# Patient Record
Sex: Female | Born: 1940 | Race: White | Hispanic: No | Marital: Married | Smoking: Former smoker
Health system: Southern US, Community
[De-identification: ages and names within clinical notes are randomized; demographics above are authoritative.]

## PROBLEM LIST (undated history)

## (undated) DIAGNOSIS — I471 Supraventricular tachycardia, unspecified: Secondary | ICD-10-CM

## (undated) DIAGNOSIS — D126 Benign neoplasm of colon, unspecified: Secondary | ICD-10-CM

## (undated) DIAGNOSIS — Z87891 Personal history of nicotine dependence: Secondary | ICD-10-CM

## (undated) DIAGNOSIS — R000 Tachycardia, unspecified: Secondary | ICD-10-CM

## (undated) DIAGNOSIS — E785 Hyperlipidemia, unspecified: Secondary | ICD-10-CM

## (undated) DIAGNOSIS — C50919 Malignant neoplasm of unspecified site of unspecified female breast: Secondary | ICD-10-CM

## (undated) DIAGNOSIS — T7840XA Allergy, unspecified, initial encounter: Secondary | ICD-10-CM

## (undated) DIAGNOSIS — C801 Malignant (primary) neoplasm, unspecified: Secondary | ICD-10-CM

## (undated) DIAGNOSIS — Z972 Presence of dental prosthetic device (complete) (partial): Secondary | ICD-10-CM

## (undated) DIAGNOSIS — K219 Gastro-esophageal reflux disease without esophagitis: Secondary | ICD-10-CM

## (undated) DIAGNOSIS — Z923 Personal history of irradiation: Secondary | ICD-10-CM

## (undated) DIAGNOSIS — Z9221 Personal history of antineoplastic chemotherapy: Secondary | ICD-10-CM

## (undated) HISTORY — DX: Hyperlipidemia, unspecified: E78.5

## (undated) HISTORY — DX: Allergy, unspecified, initial encounter: T78.40XA

## (undated) HISTORY — PX: TONSILLECTOMY: SUR1361

## (undated) HISTORY — DX: Personal history of nicotine dependence: Z87.891

## (undated) HISTORY — PX: TONSILLECTOMY: SHX5217

## (undated) HISTORY — PX: CATARACT EXTRACTION W/ INTRAOCULAR LENS IMPLANT: SHX1309

## (undated) HISTORY — PX: COLONOSCOPY: SHX174

## (undated) HISTORY — DX: Malignant (primary) neoplasm, unspecified: C80.1

---

## 1998-06-15 ENCOUNTER — Encounter: Payer: Self-pay | Admitting: Gastroenterology

## 1998-06-15 ENCOUNTER — Ambulatory Visit (HOSPITAL_COMMUNITY): Admission: RE | Admit: 1998-06-15 | Discharge: 1998-06-15 | Payer: Self-pay | Admitting: Gastroenterology

## 2003-08-07 HISTORY — PX: BREAST CYST ASPIRATION: SHX578

## 2004-12-26 ENCOUNTER — Ambulatory Visit: Payer: Self-pay | Admitting: General Surgery

## 2006-01-08 ENCOUNTER — Ambulatory Visit: Payer: Self-pay | Admitting: General Surgery

## 2007-01-14 ENCOUNTER — Ambulatory Visit: Payer: Self-pay | Admitting: General Surgery

## 2007-08-07 DIAGNOSIS — C801 Malignant (primary) neoplasm, unspecified: Secondary | ICD-10-CM

## 2007-08-07 DIAGNOSIS — C50919 Malignant neoplasm of unspecified site of unspecified female breast: Secondary | ICD-10-CM

## 2007-08-07 HISTORY — PX: BREAST LUMPECTOMY: SHX2

## 2007-08-07 HISTORY — PX: PORTACATH PLACEMENT: SHX2246

## 2007-08-07 HISTORY — DX: Malignant (primary) neoplasm, unspecified: C80.1

## 2007-08-07 HISTORY — PX: BREAST BIOPSY: SHX20

## 2007-08-07 HISTORY — PX: BREAST SURGERY: SHX581

## 2007-08-07 HISTORY — DX: Malignant neoplasm of unspecified site of unspecified female breast: C50.919

## 2008-01-15 ENCOUNTER — Ambulatory Visit: Payer: Self-pay | Admitting: General Surgery

## 2008-01-20 ENCOUNTER — Ambulatory Visit: Payer: Self-pay | Admitting: General Surgery

## 2008-02-04 ENCOUNTER — Ambulatory Visit: Payer: Self-pay | Admitting: Oncology

## 2008-02-12 ENCOUNTER — Ambulatory Visit: Payer: Self-pay | Admitting: General Surgery

## 2008-02-12 ENCOUNTER — Other Ambulatory Visit: Payer: Self-pay

## 2008-02-19 ENCOUNTER — Ambulatory Visit: Payer: Self-pay | Admitting: General Surgery

## 2008-03-02 ENCOUNTER — Ambulatory Visit: Payer: Self-pay | Admitting: Oncology

## 2008-03-06 ENCOUNTER — Ambulatory Visit: Payer: Self-pay | Admitting: Oncology

## 2008-03-08 ENCOUNTER — Ambulatory Visit: Payer: Self-pay | Admitting: General Surgery

## 2008-03-15 ENCOUNTER — Ambulatory Visit: Payer: Self-pay | Admitting: Oncology

## 2008-04-06 ENCOUNTER — Ambulatory Visit: Payer: Self-pay | Admitting: Oncology

## 2008-05-06 ENCOUNTER — Ambulatory Visit: Payer: Self-pay | Admitting: Oncology

## 2008-06-06 ENCOUNTER — Ambulatory Visit: Payer: Self-pay | Admitting: Oncology

## 2008-07-06 ENCOUNTER — Ambulatory Visit: Payer: Self-pay | Admitting: Oncology

## 2008-08-06 ENCOUNTER — Ambulatory Visit: Payer: Self-pay | Admitting: Oncology

## 2008-09-06 ENCOUNTER — Ambulatory Visit: Payer: Self-pay | Admitting: Oncology

## 2008-10-04 ENCOUNTER — Ambulatory Visit: Payer: Self-pay | Admitting: Oncology

## 2008-11-04 ENCOUNTER — Ambulatory Visit: Payer: Self-pay | Admitting: Oncology

## 2008-11-08 ENCOUNTER — Ambulatory Visit: Payer: Self-pay | Admitting: General Surgery

## 2008-11-22 ENCOUNTER — Ambulatory Visit: Payer: Self-pay | Admitting: Oncology

## 2008-12-04 ENCOUNTER — Ambulatory Visit: Payer: Self-pay | Admitting: Oncology

## 2009-01-04 ENCOUNTER — Ambulatory Visit: Payer: Self-pay | Admitting: Oncology

## 2009-02-03 ENCOUNTER — Ambulatory Visit: Payer: Self-pay | Admitting: Oncology

## 2009-03-06 ENCOUNTER — Ambulatory Visit: Payer: Self-pay | Admitting: Oncology

## 2009-04-06 ENCOUNTER — Ambulatory Visit: Payer: Self-pay | Admitting: Oncology

## 2009-05-06 ENCOUNTER — Ambulatory Visit: Payer: Self-pay | Admitting: Oncology

## 2009-05-10 ENCOUNTER — Ambulatory Visit: Payer: Self-pay | Admitting: Oncology

## 2009-05-11 ENCOUNTER — Ambulatory Visit: Payer: Self-pay | Admitting: General Surgery

## 2009-06-06 ENCOUNTER — Ambulatory Visit: Payer: Self-pay | Admitting: Oncology

## 2009-07-06 ENCOUNTER — Ambulatory Visit: Payer: Self-pay | Admitting: Oncology

## 2009-08-06 ENCOUNTER — Ambulatory Visit: Payer: Self-pay | Admitting: Oncology

## 2009-08-06 HISTORY — PX: PORT-A-CATH REMOVAL: SHX5289

## 2009-09-06 ENCOUNTER — Ambulatory Visit: Payer: Self-pay | Admitting: Oncology

## 2009-09-13 ENCOUNTER — Ambulatory Visit: Payer: Self-pay | Admitting: Oncology

## 2009-10-04 ENCOUNTER — Ambulatory Visit: Payer: Self-pay | Admitting: Oncology

## 2009-11-04 ENCOUNTER — Ambulatory Visit: Payer: Self-pay | Admitting: Oncology

## 2009-12-06 ENCOUNTER — Ambulatory Visit: Payer: Self-pay | Admitting: Oncology

## 2010-01-04 ENCOUNTER — Ambulatory Visit: Payer: Self-pay | Admitting: Oncology

## 2010-02-03 ENCOUNTER — Ambulatory Visit: Payer: Self-pay | Admitting: Oncology

## 2010-05-15 ENCOUNTER — Ambulatory Visit: Payer: Self-pay | Admitting: Oncology

## 2010-05-30 ENCOUNTER — Ambulatory Visit: Payer: Self-pay | Admitting: General Surgery

## 2010-07-11 ENCOUNTER — Ambulatory Visit: Payer: Self-pay | Admitting: Oncology

## 2010-07-12 LAB — CANCER ANTIGEN 27.29: CA 27.29: 29.4 U/mL (ref 0.0–38.6)

## 2010-08-06 ENCOUNTER — Ambulatory Visit: Payer: Self-pay | Admitting: Oncology

## 2010-10-16 ENCOUNTER — Ambulatory Visit: Payer: Self-pay | Admitting: Gastroenterology

## 2010-10-16 LAB — HM COLONOSCOPY

## 2011-01-16 ENCOUNTER — Ambulatory Visit: Payer: Self-pay | Admitting: Oncology

## 2011-02-04 ENCOUNTER — Ambulatory Visit: Payer: Self-pay | Admitting: Oncology

## 2011-05-16 ENCOUNTER — Ambulatory Visit: Payer: Self-pay | Admitting: General Surgery

## 2011-07-24 ENCOUNTER — Ambulatory Visit: Payer: Self-pay | Admitting: Oncology

## 2011-08-07 ENCOUNTER — Ambulatory Visit: Payer: Self-pay | Admitting: Oncology

## 2012-02-06 ENCOUNTER — Ambulatory Visit: Payer: Self-pay | Admitting: Oncology

## 2012-02-06 LAB — CBC CANCER CENTER
Basophil %: 0.4 %
Eosinophil #: 0 x10 3/mm (ref 0.0–0.7)
Eosinophil %: 0.3 %
HGB: 13.7 g/dL (ref 12.0–16.0)
Lymphocyte %: 25.2 %
MCH: 31.6 pg (ref 26.0–34.0)
MCHC: 33.9 g/dL (ref 32.0–36.0)
MCV: 93 fL (ref 80–100)
Monocyte #: 0.4 x10 3/mm (ref 0.2–0.9)
Neutrophil %: 67.8 %
Platelet: 225 x10 3/mm (ref 150–440)
WBC: 6 x10 3/mm (ref 3.6–11.0)

## 2012-02-06 LAB — COMPREHENSIVE METABOLIC PANEL
Albumin: 3.6 g/dL (ref 3.4–5.0)
Alkaline Phosphatase: 75 U/L (ref 50–136)
Calcium, Total: 9.2 mg/dL (ref 8.5–10.1)
Glucose: 123 mg/dL — ABNORMAL HIGH (ref 65–99)
Osmolality: 282 (ref 275–301)
SGPT (ALT): 30 U/L
Sodium: 140 mmol/L (ref 136–145)
Total Protein: 7.2 g/dL (ref 6.4–8.2)

## 2012-02-13 ENCOUNTER — Ambulatory Visit: Payer: Self-pay | Admitting: Family Medicine

## 2012-03-06 ENCOUNTER — Ambulatory Visit: Payer: Self-pay | Admitting: Oncology

## 2012-05-20 ENCOUNTER — Ambulatory Visit: Payer: Self-pay | Admitting: Oncology

## 2012-08-29 ENCOUNTER — Ambulatory Visit: Payer: Self-pay | Admitting: Oncology

## 2012-09-06 ENCOUNTER — Ambulatory Visit: Payer: Self-pay | Admitting: Oncology

## 2013-01-06 ENCOUNTER — Encounter: Payer: Self-pay | Admitting: *Deleted

## 2013-03-20 ENCOUNTER — Ambulatory Visit: Payer: Self-pay | Admitting: Oncology

## 2013-03-20 LAB — CBC CANCER CENTER
Basophil #: 0 x10 3/mm (ref 0.0–0.1)
Basophil %: 0.6 %
HCT: 38.2 % (ref 35.0–47.0)
HGB: 13.2 g/dL (ref 12.0–16.0)
Lymphocyte %: 32.7 %
MCH: 31.6 pg (ref 26.0–34.0)
Monocyte %: 6.1 %
Neutrophil #: 3.2 x10 3/mm (ref 1.4–6.5)
Neutrophil %: 60.3 %
Platelet: 217 x10 3/mm (ref 150–440)
RBC: 4.17 10*6/uL (ref 3.80–5.20)
RDW: 12.5 % (ref 11.5–14.5)
WBC: 5.3 x10 3/mm (ref 3.6–11.0)

## 2013-03-20 LAB — COMPREHENSIVE METABOLIC PANEL
Albumin: 3.5 g/dL (ref 3.4–5.0)
Anion Gap: 9 (ref 7–16)
BUN: 16 mg/dL (ref 7–18)
Bilirubin,Total: 0.5 mg/dL (ref 0.2–1.0)
Calcium, Total: 8.7 mg/dL (ref 8.5–10.1)
Chloride: 105 mmol/L (ref 98–107)
Co2: 27 mmol/L (ref 21–32)
Creatinine: 0.98 mg/dL (ref 0.60–1.30)
EGFR (Non-African Amer.): 57 — ABNORMAL LOW
Osmolality: 282 (ref 275–301)
SGOT(AST): 19 U/L (ref 15–37)
Sodium: 141 mmol/L (ref 136–145)
Total Protein: 7.1 g/dL (ref 6.4–8.2)

## 2013-03-21 LAB — CANCER ANTIGEN 27.29: CA 27.29: 21.3 U/mL (ref 0.0–38.6)

## 2013-04-06 ENCOUNTER — Ambulatory Visit: Payer: Self-pay | Admitting: Oncology

## 2013-05-21 ENCOUNTER — Ambulatory Visit: Payer: Self-pay | Admitting: General Surgery

## 2013-05-21 ENCOUNTER — Encounter: Payer: Self-pay | Admitting: General Surgery

## 2013-05-28 ENCOUNTER — Encounter: Payer: Self-pay | Admitting: General Surgery

## 2013-05-28 ENCOUNTER — Ambulatory Visit (INDEPENDENT_AMBULATORY_CARE_PROVIDER_SITE_OTHER): Payer: Medicare Other | Admitting: General Surgery

## 2013-05-28 VITALS — BP 148/70 | HR 80 | Resp 16 | Ht 65.5 in | Wt 173.0 lb

## 2013-05-28 DIAGNOSIS — Z853 Personal history of malignant neoplasm of breast: Secondary | ICD-10-CM

## 2013-05-28 NOTE — Progress Notes (Signed)
Patient ID: Toni Callahan, female   DOB: 04-13-1941, 72 y.o.   MRN: [id number]  Chief Complaint  Patient presents with  . Follow-up    1 year follow up mammogram    HPI Toni Callahan is a 72 y.o. female.  who presents for her annual follow up breast evaluation and mammogram. The most recent mammogram was done on 05-21-13.  Patient does perform regular self breast checks and gets regular mammograms done. No new breast issues. Tolerating the Femara and Dr. Doylene Canning wants her to take it for 3 more years.   HPI  Past Medical History  Diagnosis Date  . Allergy   . Personal history of tobacco use, presenting hazards to health   . Personal history of malignant neoplasm of breast 2009    left breast lumpectomy,chemotherapy and radiation therapy  . Special screening for malignant neoplasms, colon 2012  . Cancer 2009    left breast, T1, N0, M0. ER/PR positive, HER2 neu positive    Past Surgical History  Procedure Laterality Date  . Portacath placement  2009  . Breast surgery Left 2009    lumpectomy  . Aspiration biopsy  2005    breast  . Dilation and curettage of uterus  2005  . Tonsillectomy    . Colonoscopy  [id number]    Dr. Bluford Kaufmann  . Port-a-cath removal  2011    History reviewed. No pertinent family history.  Social History History  Substance Use Topics  . Smoking status: Former Smoker -- 20 years  . Smokeless tobacco: Not on file     Comment: quit in 1986 approximately  . Alcohol Use: Yes     Comment: drinks rarely    Allergies  Allergen Reactions  . Sulfa Antibiotics Other (See Comments)    Unknown reaction    Current Outpatient Prescriptions  Medication Sig Dispense Refill  . aspirin 81 MG tablet Take 81 mg by mouth daily.      . Biotin 5000 MCG CAPS Take 1 capsule by mouth daily.      . Calcium Carbonate (CALCIUM 600 PO) Take 1 tablet by mouth daily.      . Cholecalciferol (VITAMIN D PO) Take 1,000 Int'l Units by mouth daily.      . Cyanocobalamin (VITAMIN B 12  PO) Take 500 mg by mouth daily.      . Dapsone (ACZONE EX) Apply 1 application topically as needed.      . Diclofenac Sodium (SOLARAZE) 3 % GEL Place 1 application onto the skin as needed.      . folic acid (FOLVITE) 400 MCG tablet Take 400 mcg by mouth daily.      Marland Kitchen letrozole (FEMARA) 2.5 MG tablet Take 1 tablet by mouth daily.      Marland Kitchen lovastatin (MEVACOR) 40 MG tablet Take 0.5 tablets by mouth daily.      . Magnesium 250 MG TABS Take 1 tablet by mouth daily.      . metoprolol succinate (TOPROL-XL) 50 MG 24 hr tablet Take 0.5 tablets by mouth daily.      . metronidazole (NORITATE) 1 % cream Apply 1 application topically as needed.      . minocycline (MINOCIN,DYNACIN) 100 MG capsule Take 100 mg by mouth as needed.      . Multiple Vitamin (MULTIVITAMIN) tablet Take 1 tablet by mouth daily.      . Omega-3 Fatty Acids (FISH OIL) 1000 MG CAPS Take 1 capsule by mouth daily.      Marland Kitchen omeprazole (PRILOSEC) 20  MG capsule Take 1 capsule by mouth daily.      . Potassium 99 MG TABS Take 1 tablet by mouth daily.      . vitamin C (ASCORBIC ACID) 500 MG tablet Take 500 mg by mouth daily.       No current facility-administered medications for this visit.    Review of Systems Review of Systems  Blood pressure 148/70, pulse 80, resp. rate 16, height 5' 5.5" (1.664 m), weight 173 lb (78.472 kg).  Physical Exam Physical Exam  Constitutional: She is oriented to person, place, and time. She appears well-developed and well-nourished.  Eyes: Conjunctivae are normal. No scleral icterus.  Neck: Neck supple. No mass and no thyromegaly present.  Cardiovascular: Normal rate, regular rhythm and normal heart sounds.   Pulses:      Dorsalis pedis pulses are 2+ on the right side, and 2+ on the left side.       Posterior tibial pulses are 2+ on the right side, and 2+ on the left side.  No lower leg edema.  Pulmonary/Chest: Effort normal and breath sounds normal. Right breast exhibits no inverted nipple, no mass, no  nipple discharge, no skin change and no tenderness. Left breast exhibits no inverted nipple, no mass, no nipple discharge, no skin change and no tenderness.  Lumpectomy site healed incision left breast.  Abdominal: Soft. There is no hepatosplenomegaly.  Lymphadenopathy:    She has no cervical adenopathy.    She has no axillary adenopathy.  Neurological: She is alert and oriented to person, place, and time.  Skin: Skin is warm and dry.    Data Reviewed Mammogram reviewed and stable.  Assessment    Stable exam. Patient is 5 years post left breast cancer treatment. Currently on Femara which she is tolerating well.     Plan    The patient has been asked to return to the office in one year with a bilateral   diagnostic mammogram.       SANKAR,SEEPLAPUTHUR G 05/28/2013, 11:36 AM

## 2013-05-28 NOTE — Patient Instructions (Signed)
Continue self breast exams. Call office for any new breast issues or concerns. 

## 2013-10-16 ENCOUNTER — Ambulatory Visit: Payer: Self-pay | Admitting: Oncology

## 2013-10-16 LAB — COMPREHENSIVE METABOLIC PANEL
ANION GAP: 6 — AB (ref 7–16)
AST: 18 U/L (ref 15–37)
Albumin: 3.4 g/dL (ref 3.4–5.0)
Alkaline Phosphatase: 55 U/L
BUN: 16 mg/dL (ref 7–18)
Bilirubin,Total: 0.3 mg/dL (ref 0.2–1.0)
CALCIUM: 8.7 mg/dL (ref 8.5–10.1)
CHLORIDE: 103 mmol/L (ref 98–107)
CO2: 29 mmol/L (ref 21–32)
Creatinine: 0.87 mg/dL (ref 0.60–1.30)
EGFR (African American): >60
EGFR (Non-African Amer.): >60
Glucose: 109 mg/dL — ABNORMAL HIGH (ref 65–99)
Osmolality: 277 (ref 275–301)
Potassium: 4 mmol/L (ref 3.5–5.1)
SGPT (ALT): 16 U/L (ref 12–78)
SODIUM: 138 mmol/L (ref 136–145)
Total Protein: 6.9 g/dL (ref 6.4–8.2)

## 2013-10-16 LAB — CBC CANCER CENTER
Basophil #: 0 x10 3/mm (ref 0.0–0.1)
Basophil %: 0.6 %
EOS PCT: 0.7 %
Eosinophil #: 0 x10 3/mm (ref 0.0–0.7)
HCT: 39.5 % (ref 35.0–47.0)
HGB: 13.4 g/dL (ref 12.0–16.0)
LYMPHS ABS: 1.7 x10 3/mm (ref 1.0–3.6)
Lymphocyte %: 27.2 %
MCH: 30.9 pg (ref 26.0–34.0)
MCHC: 34 g/dL (ref 32.0–36.0)
MCV: 91 fL (ref 80–100)
Monocyte #: 0.3 x10 3/mm (ref 0.2–0.9)
Monocyte %: 5.2 %
Neutrophil #: 4 x10 3/mm (ref 1.4–6.5)
Neutrophil %: 66.3 %
Platelet: 259 x10 3/mm (ref 150–440)
RBC: 4.34 10*6/uL (ref 3.80–5.20)
RDW: 13.1 % (ref 11.5–14.5)
WBC: 6.1 x10 3/mm (ref 3.6–11.0)

## 2013-11-04 ENCOUNTER — Ambulatory Visit: Payer: Self-pay | Admitting: Oncology

## 2014-04-30 ENCOUNTER — Ambulatory Visit: Payer: Self-pay | Admitting: Oncology

## 2014-04-30 LAB — CBC CANCER CENTER
BASOS ABS: 0.1 x10 3/mm (ref 0.0–0.1)
BASOS PCT: 0.8 %
EOS ABS: 0 x10 3/mm (ref 0.0–0.7)
EOS PCT: 0.2 %
HCT: 40.7 % (ref 35.0–47.0)
HGB: 13.8 g/dL (ref 12.0–16.0)
Lymphocyte #: 1.7 x10 3/mm (ref 1.0–3.6)
Lymphocyte %: 22.8 %
MCH: 31.1 pg (ref 26.0–34.0)
MCHC: 34 g/dL (ref 32.0–36.0)
MCV: 92 fL (ref 80–100)
MONOS PCT: 6.4 %
Monocyte #: 0.5 x10 3/mm (ref 0.2–0.9)
NEUTROS ABS: 5.2 x10 3/mm (ref 1.4–6.5)
NEUTROS PCT: 69.8 %
PLATELETS: 245 x10 3/mm (ref 150–440)
RBC: 4.45 10*6/uL (ref 3.80–5.20)
RDW: 12.2 % (ref 11.5–14.5)
WBC: 7.5 x10 3/mm (ref 3.6–11.0)

## 2014-04-30 LAB — COMPREHENSIVE METABOLIC PANEL
ALT: 29 U/L
ANION GAP: 8 (ref 7–16)
Albumin: 3.6 g/dL (ref 3.4–5.0)
Alkaline Phosphatase: 61 U/L
BILIRUBIN TOTAL: 0.5 mg/dL (ref 0.2–1.0)
BUN: 15 mg/dL (ref 7–18)
CREATININE: 0.9 mg/dL (ref 0.60–1.30)
Calcium, Total: 8.9 mg/dL (ref 8.5–10.1)
Chloride: 106 mmol/L (ref 98–107)
Co2: 26 mmol/L (ref 21–32)
EGFR (African American): >60
Glucose: 90 mg/dL (ref 65–99)
OSMOLALITY: 280 (ref 275–301)
Potassium: 4.2 mmol/L (ref 3.5–5.1)
SGOT(AST): 20 U/L (ref 15–37)
SODIUM: 140 mmol/L (ref 136–145)
Total Protein: 7.1 g/dL (ref 6.4–8.2)

## 2014-05-03 LAB — CANCER ANTIGEN 27.29: CA 27.29: 26 U/mL (ref 0.0–38.6)

## 2014-05-06 ENCOUNTER — Ambulatory Visit: Payer: Self-pay | Admitting: Oncology

## 2014-05-17 DIAGNOSIS — R0681 Apnea, not elsewhere classified: Secondary | ICD-10-CM | POA: Insufficient documentation

## 2014-05-24 ENCOUNTER — Encounter: Payer: Self-pay | Admitting: General Surgery

## 2014-05-24 ENCOUNTER — Ambulatory Visit: Payer: Self-pay | Admitting: General Surgery

## 2014-06-03 ENCOUNTER — Ambulatory Visit (INDEPENDENT_AMBULATORY_CARE_PROVIDER_SITE_OTHER): Payer: Medicare Other | Admitting: General Surgery

## 2014-06-03 ENCOUNTER — Encounter: Payer: Self-pay | Admitting: General Surgery

## 2014-06-03 VITALS — BP 130/68 | Ht 65.0 in | Wt 166.0 lb

## 2014-06-03 DIAGNOSIS — C50212 Malignant neoplasm of upper-inner quadrant of left female breast: Secondary | ICD-10-CM | POA: Insufficient documentation

## 2014-06-03 DIAGNOSIS — Z853 Personal history of malignant neoplasm of breast: Secondary | ICD-10-CM | POA: Insufficient documentation

## 2014-06-03 NOTE — Patient Instructions (Addendum)
Continue self breast exams. Call office for any new breast issues or concerns.    Follow up in one year bilateral diagnostic mammogram and office visit.

## 2014-06-03 NOTE — Progress Notes (Signed)
Patient ID: Toni Callahan, female   DOB: April 20, 1941, 73 y.o.   MRN: [id number]  Chief Complaint  Patient presents with  . Follow-up    mammogram    HPI Toni Callahan is a 73 y.o. female.  who presents for her follow up breast cancer and breast evaluation. The most recent mammogram was done on 05-24-14.  Patient does perform regular self breast checks and gets regular mammograms done.  Tolerating Femara and Dr Oliva Bustard wants her to take it 3 more years. No new breast issues.  HPI  Past Medical History  Diagnosis Date  . Allergy   . Personal history of tobacco use, presenting hazards to health   . Personal history of malignant neoplasm of breast 2009    left breast lumpectomy,chemotherapy and radiation therapy  . Special screening for malignant neoplasms, colon 2012  . Cancer 2009    left breast, T1, N0, M0. ER/PR positive, HER2 neu positive    Past Surgical History  Procedure Laterality Date  . Portacath placement  2009  . Breast surgery Left 2009    lumpectomy  . Aspiration biopsy  2005    breast  . Dilation and curettage of uterus  2005  . Tonsillectomy    . Colonoscopy  [id number]    Dr. Candace Cruise  . Port-a-cath removal  2011    History reviewed. No pertinent family history.  Social History History  Substance Use Topics  . Smoking status: Former Smoker -- 20 years  . Smokeless tobacco: Never Used     Comment: quit in 1986 approximately  . Alcohol Use: Yes     Comment: drinks rarely    Allergies  Allergen Reactions  . Sulfa Antibiotics Other (See Comments)    Unknown reaction    Current Outpatient Prescriptions  Medication Sig Dispense Refill  . aspirin 81 MG tablet Take 81 mg by mouth daily.      . Biotin 5000 MCG CAPS Take 1 capsule by mouth daily.      . Calcium Carbonate (CALCIUM 600 PO) Take 1 tablet by mouth daily.      . Cholecalciferol (VITAMIN D PO) Take 1,000 Int'l Units by mouth daily.      . Cyanocobalamin (VITAMIN B 12 PO) Take 500 mg by mouth  daily.      . folic acid (FOLVITE) 478 MCG tablet Take 400 mcg by mouth daily.      Marland Kitchen letrozole (FEMARA) 2.5 MG tablet Take 1 tablet by mouth daily.      Marland Kitchen lovastatin (MEVACOR) 40 MG tablet Take 0.5 tablets by mouth daily.      . Magnesium 250 MG TABS Take 1 tablet by mouth daily.      . metoprolol succinate (TOPROL-XL) 50 MG 24 hr tablet Take 0.5 tablets by mouth daily.      . Multiple Vitamin (MULTIVITAMIN) tablet Take 1 tablet by mouth daily.      . Omega-3 Fatty Acids (FISH OIL) 1000 MG CAPS Take 1 capsule by mouth every other day.       . Potassium 99 MG TABS Take 1 tablet by mouth daily.      . vitamin C (ASCORBIC ACID) 500 MG tablet Take 500 mg by mouth every other day.        No current facility-administered medications for this visit.    Review of Systems Review of Systems  Constitutional: Negative.   Respiratory: Negative.   Cardiovascular: Negative.     Blood pressure 130/68, height _0  (  1.651 m), weight 166 lb (75.297 kg).  Physical Exam Physical Exam  Constitutional: She is oriented to person, place, and time. She appears well-developed and well-nourished.  Eyes: Conjunctivae are normal. No scleral icterus.  Neck: Neck supple.  Cardiovascular: Normal rate, regular rhythm and normal heart sounds.   Pulses:      Dorsalis pedis pulses are 2+ on the right side, and 2+ on the left side.       Posterior tibial pulses are 2+ on the right side, and 2+ on the left side.  No lower leg edema.  Pulmonary/Chest: Effort normal and breath sounds normal. Right breast exhibits no inverted nipple, no mass, no nipple discharge, no skin change and no tenderness. Left breast exhibits no inverted nipple, no mass, no nipple discharge, no skin change and no tenderness.  Well healed lumpectomy site left breast.  Abdominal: Soft. Normal appearance. There is no hepatosplenomegaly. There is no tenderness.  Lymphadenopathy:    She has no cervical adenopathy.    She has no axillary adenopathy.   Neurological: She is alert and oriented to person, place, and time.  Skin: Skin is warm and dry.    Data Reviewed Mammogram reviewed and stable.  Assessment    Stable physical exam.    Plan    Follow up in one year bilateral diagnostic mammogram and office visit.     PCP: Lattie Haw 06/03/2014, 5:30 PM

## 2014-06-07 ENCOUNTER — Encounter: Payer: Self-pay | Admitting: General Surgery

## 2014-09-10 DIAGNOSIS — Z961 Presence of intraocular lens: Secondary | ICD-10-CM | POA: Insufficient documentation

## 2014-09-16 DIAGNOSIS — I10 Essential (primary) hypertension: Secondary | ICD-10-CM | POA: Insufficient documentation

## 2014-09-16 DIAGNOSIS — I071 Rheumatic tricuspid insufficiency: Secondary | ICD-10-CM | POA: Insufficient documentation

## 2015-01-27 ENCOUNTER — Encounter: Payer: Self-pay | Admitting: Family Medicine

## 2015-01-27 ENCOUNTER — Ambulatory Visit (INDEPENDENT_AMBULATORY_CARE_PROVIDER_SITE_OTHER): Payer: Medicare Other | Admitting: Family Medicine

## 2015-01-27 VITALS — BP 151/80 | HR 71 | Temp 97.8°F | Ht 64.6 in | Wt 166.6 lb

## 2015-01-27 DIAGNOSIS — L259 Unspecified contact dermatitis, unspecified cause: Secondary | ICD-10-CM

## 2015-01-27 MED ORDER — PREDNISONE 10 MG PO TABS: ORAL_TABLET | ORAL | Status: DC

## 2015-01-27 NOTE — Patient Instructions (Addendum)
Take any OTC allergy medicine- zyrtec, allegra, claritin Keep putting the benadryl or the calomine lotion on it.   Contact Dermatitis Contact dermatitis is a reaction to certain substances that touch the skin. Contact dermatitis can be either irritant contact dermatitis or allergic contact dermatitis. Irritant contact dermatitis does not require previous exposure to the substance for a reaction to occur.Allergic contact dermatitis only occurs if you have been exposed to the substance before. Upon a repeat exposure, your body reacts to the substance.  CAUSES  Many substances can cause contact dermatitis. Irritant dermatitis is most commonly caused by repeated exposure to mildly irritating substances, such as:  Makeup.  Soaps.  Detergents.  Bleaches.  Acids.  Metal salts, such as nickel. Allergic contact dermatitis is most commonly caused by exposure to:  Poisonous plants.  Chemicals (deodorants, shampoos).  Jewelry.  Latex.  Neomycin in triple antibiotic cream.  Preservatives in products, including clothing. SYMPTOMS  The area of skin that is exposed may develop:  Dryness or flaking.  Redness.  Cracks.  Itching.  Pain or a burning sensation.  Blisters. With allergic contact dermatitis, there may also be swelling in areas such as the eyelids, mouth, or genitals.  DIAGNOSIS  Your caregiver can usually tell what the problem is by doing a physical exam. In cases where the cause is uncertain and an allergic contact dermatitis is suspected, a patch skin test may be performed to help determine the cause of your dermatitis. TREATMENT Treatment includes protecting the skin from further contact with the irritating substance by avoiding that substance if possible. Barrier creams, powders, and gloves may be helpful. Your caregiver may also recommend:  Steroid creams or ointments applied 2 times daily. For best results, soak the rash area in cool water for 20 minutes. Then  apply the medicine. Cover the area with a plastic wrap. You can store the steroid cream in the refrigerator for a "chilly" effect on your rash. That may decrease itching. Oral steroid medicines may be needed in more severe cases.  Antibiotics or antibacterial ointments if a skin infection is present.  Antihistamine lotion or an antihistamine taken by mouth to ease itching.  Lubricants to keep moisture in your skin.  Burow's solution to reduce redness and soreness or to dry a weeping rash. Mix one packet or tablet of solution in 2 cups cool water. Dip a clean washcloth in the mixture, wring it out a bit, and put it on the affected area. Leave the cloth in place for 30 minutes. Do this as often as possible throughout the day.  Taking several cornstarch or baking soda baths daily if the area is too large to cover with a washcloth. Harsh chemicals, such as alkalis or acids, can cause skin damage that is like a burn. You should flush your skin for 15 to 20 minutes with cold water after such an exposure. You should also seek immediate medical care after exposure. Bandages (dressings), antibiotics, and pain medicine may be needed for severely irritated skin.  HOME CARE INSTRUCTIONS  Avoid the substance that caused your reaction.  Keep the area of skin that is affected away from hot water, soap, sunlight, chemicals, acidic substances, or anything else that would irritate your skin.  Do not scratch the rash. Scratching may cause the rash to become infected.  You may take cool baths to help stop the itching.  Only take over-the-counter or prescription medicines as directed by your caregiver.  See your caregiver for follow-up care as directed to  make sure your skin is healing properly. SEEK MEDICAL CARE IF:   Your condition is not better after 3 days of treatment.  You seem to be getting worse.  You see signs of infection such as swelling, tenderness, redness, soreness, or warmth in the affected  area.  You have any problems related to your medicines. Document Released: 07/20/2000 Document Revised: 10/15/2011 Document Reviewed: 12/26/2010 [facility] Patient Information 2015 [place], [place]. This information is not intended to replace advice given to you by your health care provider. Make sure you discuss any questions you have with your health care provider.

## 2015-01-27 NOTE — Progress Notes (Signed)
  BP 151/80 mmHg  Pulse 71  Temp(Src) 97.8 F (36.6 C)  Ht 5' 4.6" (1.641 m)  Wt 166 lb 9.6 oz (75.569 kg)  BMI 28.06 kg/m2  SpO2 96%   Subjective:    Patient ID: Toni Callahan, female    DOB: [birth year], 74 y.o.   MRN: [id number]  HPI: Toni Callahan is a 74 y.o. female  Chief Complaint  Patient presents with  . Rash   RASH was cutting a redbush in her yard yesterday in a sleeveless shirt and a couple of hours later ended up with a very itchy irritated rash on her arm.  Duration:1-2  days  Location: shoulder, arm, neck and back  Itching: yes Burning: no Redness: yes Oozing: no Scaling: no Blisters: yes Painful: no Fevers: no Change in detergents/soaps/personal care products: no Recent illness: no Recent travel:no History of same: no Context: stable Alleviating factors: benadryl Treatments attempted:benadryl Shortness of breath: no  Throat/tongue swelling: no Myalgias/arthralgias: no Relevant past medical, surgical, family and social history reviewed and updated as indicated. Interim medical history since our last visit reviewed. Allergies and medications reviewed and updated.  Review of Systems  Constitutional: Negative.   Respiratory: Positive for apnea.   Cardiovascular: Negative.   Skin: Positive for rash. Negative for color change, pallor and wound.  Psychiatric/Behavioral: Negative.     Per HPI unless specifically indicated above     Objective:    BP 151/80 mmHg  Pulse 71  Temp(Src) 97.8 F (36.6 C)  Ht 5' 4.6" (1.641 m)  Wt 166 lb 9.6 oz (75.569 kg)  BMI 28.06 kg/m2  SpO2 96%  Wt Readings from Last 3 Encounters:  01/27/15 166 lb 9.6 oz (75.569 kg)  06/03/14 166 lb (75.297 kg)  05/28/13 173 lb (78.472 kg)    Physical Exam  Constitutional: She is oriented to person, place, and time. She appears well-developed and well-nourished. No distress.  HENT:  Head: Normocephalic and atraumatic.  Right Ear: Hearing normal.  Left Ear: Hearing  normal.  Nose: Nose normal.  Eyes: Conjunctivae and lids are normal. Right eye exhibits no discharge. Left eye exhibits no discharge. No scleral icterus.  Pulmonary/Chest: Effort normal. No respiratory distress.  Musculoskeletal: Normal range of motion.  Neurological: She is alert and oriented to person, place, and time.  Skin: Skin is intact. Rash noted.  erythmatous small wheels on R shoulder and neck with some excoriation  Psychiatric: She has a normal mood and affect. Her speech is normal and behavior is normal. Judgment and thought content normal. Cognition and memory are normal.  Nursing note and vitals reviewed.       Assessment & Plan:   Problem List Items Addressed This Visit    None    Visit Diagnoses    Contact dermatitis    -  Primary    From her tree yesterday, severe. Will do longer taper and OTC allergy medicines. Call if not getting better or getting worse.         Follow up plan: Return if symptoms worsen or fail to improve.

## 2015-03-21 ENCOUNTER — Other Ambulatory Visit: Payer: Self-pay

## 2015-03-21 DIAGNOSIS — C50212 Malignant neoplasm of upper-inner quadrant of left female breast: Secondary | ICD-10-CM

## 2015-04-22 ENCOUNTER — Other Ambulatory Visit: Payer: Self-pay | Admitting: *Deleted

## 2015-04-22 DIAGNOSIS — C50212 Malignant neoplasm of upper-inner quadrant of left female breast: Secondary | ICD-10-CM

## 2015-04-28 ENCOUNTER — Inpatient Hospital Stay (HOSPITAL_BASED_OUTPATIENT_CLINIC_OR_DEPARTMENT_OTHER): Payer: Medicare Other | Admitting: Oncology

## 2015-04-28 ENCOUNTER — Inpatient Hospital Stay: Payer: Medicare Other | Attending: Oncology

## 2015-04-28 ENCOUNTER — Ambulatory Visit (INDEPENDENT_AMBULATORY_CARE_PROVIDER_SITE_OTHER): Payer: Medicare Other | Admitting: Family Medicine

## 2015-04-28 ENCOUNTER — Encounter: Payer: Self-pay | Admitting: Oncology

## 2015-04-28 ENCOUNTER — Encounter: Payer: Self-pay | Admitting: Family Medicine

## 2015-04-28 VITALS — BP 123/74 | HR 81 | Temp 98.8°F | Ht 64.6 in | Wt 169.0 lb

## 2015-04-28 VITALS — BP 129/82 | HR 74 | Temp 95.9°F | Wt 169.2 lb

## 2015-04-28 DIAGNOSIS — C50212 Malignant neoplasm of upper-inner quadrant of left female breast: Secondary | ICD-10-CM

## 2015-04-28 DIAGNOSIS — Z7982 Long term (current) use of aspirin: Secondary | ICD-10-CM | POA: Insufficient documentation

## 2015-04-28 DIAGNOSIS — Z170 Estrogen receptor positive status [ER+]: Secondary | ICD-10-CM | POA: Diagnosis not present

## 2015-04-28 DIAGNOSIS — Z79899 Other long term (current) drug therapy: Secondary | ICD-10-CM | POA: Insufficient documentation

## 2015-04-28 DIAGNOSIS — Z87891 Personal history of nicotine dependence: Secondary | ICD-10-CM

## 2015-04-28 DIAGNOSIS — Z9223 Personal history of estrogen therapy: Secondary | ICD-10-CM | POA: Diagnosis not present

## 2015-04-28 DIAGNOSIS — R109 Unspecified abdominal pain: Secondary | ICD-10-CM | POA: Insufficient documentation

## 2015-04-28 DIAGNOSIS — R1084 Generalized abdominal pain: Secondary | ICD-10-CM

## 2015-04-28 DIAGNOSIS — E785 Hyperlipidemia, unspecified: Secondary | ICD-10-CM | POA: Insufficient documentation

## 2015-04-28 DIAGNOSIS — Z853 Personal history of malignant neoplasm of breast: Secondary | ICD-10-CM

## 2015-04-28 DIAGNOSIS — I471 Supraventricular tachycardia: Secondary | ICD-10-CM | POA: Insufficient documentation

## 2015-04-28 LAB — COMPREHENSIVE METABOLIC PANEL
ALK PHOS: 48 U/L (ref 38–126)
ALT: 17 U/L (ref 14–54)
ANION GAP: 8 (ref 5–15)
AST: 24 U/L (ref 15–41)
Albumin: 3.8 g/dL (ref 3.5–5.0)
BILIRUBIN TOTAL: 0.6 mg/dL (ref 0.3–1.2)
BUN: 18 mg/dL (ref 6–20)
CALCIUM: 8.4 mg/dL — AB (ref 8.9–10.3)
CO2: 24 mmol/L (ref 22–32)
CREATININE: 0.78 mg/dL (ref 0.44–1.00)
Chloride: 105 mmol/L (ref 101–111)
Glucose, Bld: 113 mg/dL — ABNORMAL HIGH (ref 65–99)
Potassium: 4 mmol/L (ref 3.5–5.1)
SODIUM: 137 mmol/L (ref 135–145)
TOTAL PROTEIN: 7 g/dL (ref 6.5–8.1)

## 2015-04-28 LAB — MICROSCOPIC EXAMINATION: RENAL EPITHEL UA: NONE SEEN /HPF

## 2015-04-28 LAB — CBC WITH DIFFERENTIAL/PLATELET
Basophils Absolute: 0 10*3/uL (ref 0–0.1)
Basophils Relative: 1 %
EOS ABS: 0 10*3/uL (ref 0–0.7)
Eosinophils Relative: 0 %
HEMATOCRIT: 40.2 % (ref 35.0–47.0)
HEMOGLOBIN: 13.6 g/dL (ref 12.0–16.0)
LYMPHS ABS: 1.6 10*3/uL (ref 1.0–3.6)
LYMPHS PCT: 25 %
MCH: 31 pg (ref 26.0–34.0)
MCHC: 33.9 g/dL (ref 32.0–36.0)
MCV: 91.2 fL (ref 80.0–100.0)
MONOS PCT: 6 %
Monocytes Absolute: 0.4 10*3/uL (ref 0.2–0.9)
NEUTROS PCT: 68 %
Neutro Abs: 4.4 10*3/uL (ref 1.4–6.5)
Platelets: 227 10*3/uL (ref 150–440)
RBC: 4.4 MIL/uL (ref 3.80–5.20)
RDW: 12.6 % (ref 11.5–14.5)
WBC: 6.5 10*3/uL (ref 3.6–11.0)

## 2015-04-28 LAB — URINALYSIS, ROUTINE W REFLEX MICROSCOPIC
BILIRUBIN UA: NEGATIVE
GLUCOSE, UA: NEGATIVE
Ketones, UA: NEGATIVE
Nitrite, UA: NEGATIVE
PH UA: 5.5 (ref 5.0–7.5)
PROTEIN UA: NEGATIVE
SPEC GRAV UA: 1.015 (ref 1.005–1.030)
UUROB: 0.2 mg/dL (ref 0.2–1.0)

## 2015-04-28 NOTE — Progress Notes (Signed)
BP 123/74 mmHg  Pulse 81  Temp(Src) 98.8 F (37.1 C)  Ht 5' 4.6" (1.641 m)  Wt 169 lb (76.658 kg)  BMI 28.47 kg/m2  SpO2 98%   Subjective:    Patient ID: Toni Callahan, female    DOB: March 07, 1941, 74 y.o.   MRN: [id number]  HPI: Toni Callahan is a 74 y.o. female  Chief Complaint  Patient presents with  . changes in bowel movements   patient for the last month to 6 weeks has noticed bowel movements have changed have gotten slightly darker, change in character with a little looser and more frequent and less volume. Sometimes bowel movements are slightly painful, but different in a nonspecific way. Reviewed  Last colonoscopy from 2014 which was entirely normal. No nausea vomiting, no loss of appetite or weight loss. Some occasional abdominal pain associated generalized Patient with a lot of stress in her life is been ongoing for the last year or so her husband becoming ill with congestive heart failure.  Got a good report from [facility] with breast cancer with no signs or symptoms now 7 years post diagnosis and treatment. Had blood work done this morning at the [facility] see below Blood pressure cholesterol medicines doing well no change no symptoms.  Relevant past medical, surgical, family and social history reviewed and updated as indicated. Interim medical history since our last visit reviewed. Allergies and medications reviewed and updated.  Review of Systems  Constitutional: Negative.   Respiratory: Negative.   Cardiovascular: Negative.     Per HPI unless specifically indicated above     Objective:    BP 123/74 mmHg  Pulse 81  Temp(Src) 98.8 F (37.1 C)  Ht 5' 4.6" (1.641 m)  Wt 169 lb (76.658 kg)  BMI 28.47 kg/m2  SpO2 98%  Wt Readings from Last 3 Encounters:  04/28/15 169 lb (76.658 kg)  04/28/15 169 lb 4 oz (76.771 kg)  01/27/15 166 lb 9.6 oz (75.569 kg)    Physical Exam  Constitutional: She is oriented to person, place, and time. She  appears well-developed and well-nourished. No distress.  HENT:  Head: Normocephalic and atraumatic.  Right Ear: Hearing and external ear normal.  Left Ear: Hearing and external ear normal.  Nose: Nose normal.  Eyes: Conjunctivae and lids are normal. Right eye exhibits no discharge. Left eye exhibits no discharge. No scleral icterus.  Cardiovascular: Normal rate, regular rhythm and normal heart sounds.   Pulmonary/Chest: Effort normal. No respiratory distress. She exhibits no tenderness.  Musculoskeletal: Normal range of motion.  Lymphadenopathy:    She has no cervical adenopathy.  Neurological: She is alert and oriented to person, place, and time.  Skin: Skin is intact. No rash noted.  Psychiatric: She has a normal mood and affect. Her speech is normal and behavior is normal. Judgment and thought content normal. Cognition and memory are normal.    Results for orders placed or performed in visit on 04/28/15  CBC with Differential  Result Value Ref Range   WBC 6.5 3.6 - 11.0 K/uL   RBC 4.40 3.80 - 5.20 MIL/uL   Hemoglobin 13.6 12.0 - 16.0 g/dL   HCT 40.2 35.0 - 47.0 %   MCV 91.2 80.0 - 100.0 fL   MCH 31.0 26.0 - 34.0 pg   MCHC 33.9 32.0 - 36.0 g/dL   RDW 12.6 11.5 - 14.5 %   Platelets 227 150 - 440 K/uL   Neutrophils Relative % 68 %   Neutro  Abs 4.4 1.4 - 6.5 K/uL   Lymphocytes Relative 25 %   Lymphs Abs 1.6 1.0 - 3.6 K/uL   Monocytes Relative 6 %   Monocytes Absolute 0.4 0.2 - 0.9 K/uL   Eosinophils Relative 0 %   Eosinophils Absolute 0.0 0 - 0.7 K/uL   Basophils Relative 1 %   Basophils Absolute 0.0 0 - 0.1 K/uL  Comprehensive metabolic panel  Result Value Ref Range   Sodium 137 135 - 145 mmol/L   Potassium 4.0 3.5 - 5.1 mmol/L   Chloride 105 101 - 111 mmol/L   CO2 24 22 - 32 mmol/L   Glucose, Bld 113 (H) 65 - 99 mg/dL   BUN 18 6 - 20 mg/dL   Creatinine, Ser 0.78 0.44 - 1.00 mg/dL   Calcium 8.4 (L) 8.9 - 10.3 mg/dL   Total Protein 7.0 6.5 - 8.1 g/dL   Albumin 3.8 3.5  - 5.0 g/dL   AST 24 15 - 41 U/L   ALT 17 14 - 54 U/L   Alkaline Phosphatase 48 38 - 126 U/L   Total Bilirubin 0.6 0.3 - 1.2 mg/dL   GFR calc non Af Amer >60 >60 mL/min   GFR calc Af Amer >60 >60 mL/min   Anion gap 8 5 - 15      Assessment & Plan:   Problem List Items Addressed This Visit      Other   Abdominal pain - Primary    Reviewed lab work from hematology this morning with normal CBC and CMP. Urinalysis done here was normal Colonoscopy reviewed was normal For now will observe patient's symptoms patient increase fiber  report any changes.      Relevant Orders   Urinalysis, Routine w reflex microscopic (not at [facility]   CBC With Differential/Platelet   Basic metabolic panel       Follow up plan: Return if symptoms worsen or fail to improve, for Has follow-up appointment in January.

## 2015-04-28 NOTE — Progress Notes (Signed)
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  Reports feeling well, no c/o.  Specifically denies fever, chills, HA, dizziness, chest pain, dyspnea, n/v, diarrhea/constipation, or pain.  Appetite good.   Patient is off letrozole.  Having no bony pain or bony fracture.  Getting regular mammograms done  REVIEW OF SYSTEMS:   GENERAL:  Feels good.  Active.  No fevers, sweats or weight loss. PERFORMANCE STATUS (ECOG):  01 HEENT:  No visual changes, runny nose, sore throat, mouth sores or tenderness. Lungs: No shortness of breath or cough.  No hemoptysis. Cardiac:  No chest pain, palpitations, orthopnea, or PND. GI:  No nausea, vomiting, diarrhea, constipation, melena or hematochezia. GU:  No urgency, frequency, dysuria, or hematuria. Musculoskeletal:  No back pain.  No joint pain.  No muscle tenderness. Extremities:  No pain or swelling. Skin:  No rashes or skin changes. Neuro:  No headache, numbness or weakness, balance or coordination issues. Endocrine:  No diabetes, thyroid issues, hot flashes or night sweats. Psych:  No mood changes, depression or anxiety. Pain:  No focal pain. Review of systems:  All other systems reviewed and found to be negative.  As per HPI. Otherwise, a complete review of systems is negatve.  PAST  MEDICAL HISTORY: Past Medical History  Diagnosis Date  . Allergy   . Personal history of tobacco use, presenting hazards to health   . Personal history of malignant neoplasm of breast 2009    left breast lumpectomy,chemotherapy and radiation therapy  . Special screening for malignant neoplasms, colon 2012  . Cancer 2009    left breast, T1, N0, M0. ER/PR positive, HER2 neu positive    PAST SURGICAL HISTORY: Past Surgical History  Procedure Laterality Date  . Portacath placement  2009  . Breast surgery Left 2009    lumpectomy  . Aspiration biopsy  2005    breast  . Tonsillectomy    . Colonoscopy  [id number]    Dr. Candace Cruise  . Port-a-cath removal  2011    FAMILY HISTORY Family History  Problem Relation Age of Onset  . Stroke Mother   . Heart disease Father   . Diabetes Brother    status post lumpectomy and sentinel lymph node biopsy in July  of 2009   PFSH:  Family History noncontributory   Comments No family history of colorectal cancer, breast cancer, or ovarian cancer.   r   Additional Past Medical and Surgical History does not smoke at smoke several years ago. Previous use of estrogen therapy for several years. Off and now   ALLERGIES:   Sulfa drugs: GI Distress  ADVANCED DIRECTIVES:  Patient does not have any living will or healthcare power of attorney.  Information was given .  Available resources had been discussed.  We will follow-up on subsequent appointments regarding this issue HEALTH MAINTENANCE: Social History  Substance  Use Topics  . Smoking status: Former Smoker -- 20 years    Quit date: 08/06/1984  . Smokeless tobacco: Never Used     Comment: quit in 1986 approximately  . Alcohol Use: Yes     Comment: drinks rarely      Allergies  Allergen Reactions  . Sulfa Antibiotics Nausea Only    Current Outpatient Prescriptions  Medication Sig Dispense Refill  . aspirin 81 MG tablet Take 81 mg by mouth daily.    . Biotin 5000 MCG CAPS Take 1 capsule by  mouth daily.    . Calcium Carbonate (CALCIUM 600 PO) Take 1 tablet by mouth daily.    . Cholecalciferol (VITAMIN D PO) Take 1,000 Int'l Units by mouth daily.    . Cyanocobalamin (VITAMIN B 12 PO) Take 500 mg by mouth daily.    . folic acid (FOLVITE) 509 MCG tablet Take 400 mcg by mouth daily.    Marland Kitchen lovastatin (MEVACOR) 40 MG tablet Take 0.5 tablets by mouth daily.    . Magnesium 250 MG TABS Take 1 tablet by mouth daily.    . metoprolol succinate (TOPROL-XL) 50 MG 24 hr tablet Take 0.5 tablets by mouth daily.    . Multiple Vitamin (MULTIVITAMIN) tablet Take 1 tablet by mouth daily.    . Omega-3 Fatty Acids (FISH OIL) 1000 MG CAPS Take 1 capsule by mouth every other day.     . Potassium 99 MG TABS Take 1 tablet by mouth daily.    . predniSONE (DELTASONE) 10 MG tablet Take 6 pills today and tomorrow, 5 pills days 3 and 4, decrease by 1 pill every 2 days until gone. 42 tablet 0  . vitamin C (ASCORBIC ACID) 500 MG tablet Take 500 mg by mouth every other day.     Marland Kitchen omeprazole (PRILOSEC) 20 MG capsule Take by mouth.     No current facility-administered medications for this visit.    OBJECTIVE:  Filed Vitals:   04/28/15 1032  BP: 129/82  Pulse: 74  Temp: 95.9 F (35.5 C)     Body mass index is 28.51 kg/(m^2).    ECOG FS:1 - Symptomatic but completely ambulatory  PHYSICAL EXAM: General  status: Performance status is good.  Patient has not lost significant weight HEENT: No evidence of stomatitis. Sclera and conjunctivae :: No jaundice.   pale looking. Lungs: Air  entry equal on both sides.  No rhonchi.  No rales.  Cardiac: Heart sounds are normal.  No pericardial rub.  No murmur. Lymphatic system: Cervical, axillary, inguinal, lymph nodes not palpable GI: Abdomen is soft.  No ascites.  Liver spleen not palpable.  No tenderness.  Bowel sounds are within normal limit Lower extremity: No edema Neurological system: Higher functions, cranial nerves intact no evidence of peripheral  neuropathy. Skin: No rash.  No ecchymosis.. Examination of both breasts: No palpable masses.  Lymph nodes not palpable. No evidence of recurrent disease   LAB RESULTS:  CBC Latest Ref Rng 04/28/2015 04/30/2014  WBC 3.6 - 11.0 K/uL 6.5 7.5  Hemoglobin 12.0 - 16.0 g/dL 13.6 13.8  Hematocrit 35.0 - 47.0 % 40.2 40.7  Platelets 150 - 440 K/uL 227 245    Appointment on 04/28/2015  Component Date Value Ref Range Status  . WBC 04/28/2015 6.5  3.6 - 11.0 K/uL Final  . RBC 04/28/2015 4.40  3.80 - 5.20 MIL/uL Final  . Hemoglobin 04/28/2015 13.6  12.0 - 16.0 g/dL Final  . HCT 04/28/2015 40.2  35.0 - 47.0 %  Final  . MCV 04/28/2015 91.2  80.0 - 100.0 fL Final  . MCH 04/28/2015 31.0  26.0 - 34.0 pg Final  . MCHC 04/28/2015 33.9  32.0 - 36.0 g/dL Final  . RDW 04/28/2015 12.6  11.5 - 14.5 % Final  . Platelets 04/28/2015 227  150 - 440 K/uL Final  . Neutrophils Relative % 04/28/2015 68   Final  . Neutro Abs 04/28/2015 4.4  1.4 - 6.5 K/uL Final  . Lymphocytes Relative 04/28/2015 25   Final  . Lymphs Abs 04/28/2015 1.6  1.0 - 3.6 K/uL Final  . Monocytes Relative 04/28/2015 6   Final  . Monocytes Absolute 04/28/2015 0.4  0.2 - 0.9 K/uL Final  . Eosinophils Relative 04/28/2015 0   Final  . Eosinophils Absolute 04/28/2015 0.0  0 - 0.7 K/uL Final  . Basophils Relative 04/28/2015 1   Final  . Basophils Absolute 04/28/2015 0.0  0 - 0.1 K/uL Final  . Sodium 04/28/2015 137  135 - 145 mmol/L Final  . Potassium 04/28/2015 4.0  3.5 - 5.1 mmol/L Final  . Chloride 04/28/2015 105  101 - 111 mmol/L Final  . CO2 04/28/2015 24  22 - 32 mmol/L Final  . Glucose, Bld 04/28/2015 113* 65 - 99 mg/dL Final  . BUN 04/28/2015 18  6 - 20 mg/dL Final  . Creatinine, Ser 04/28/2015 0.78  0.44 - 1.00 mg/dL Final  . Calcium 04/28/2015 8.4* 8.9 - 10.3 mg/dL Final  . Total Protein 04/28/2015 7.0  6.5 - 8.1 g/dL Final  . Albumin 04/28/2015 3.8  3.5 - 5.0 g/dL Final  . AST 04/28/2015 24  15 - 41 U/L Final  . ALT 04/28/2015 17   14 - 54 U/L Final  . Alkaline Phosphatase 04/28/2015 48  38 - 126 U/L Final  . Total Bilirubin 04/28/2015 0.6  0.3 - 1.2 mg/dL Final  . GFR calc non Af Amer 04/28/2015 >60  >60 mL/min Final  . GFR calc Af Amer 04/28/2015 >60  >60 mL/min Final   Comment: (NOTE) The eGFR has been calculated using the CKD EPI equation. This calculation has not been validated in all clinical situations. eGFR's persistently <60 mL/min signify possible Chronic Kidney Disease.   . Anion gap 04/28/2015 8  5 - 15 Final      Component     Latest Ref Rng 07/11/2010 01/16/2011 07/24/2011 02/06/2012 03/20/2013  CA 27.29     0.0 - 38.6 U/mL 29.4 25.3 21.2 23.1 21.3   Component     Latest Ref Rng 04/30/2014 04/28/2015  CA 27.29     0.0 - 38.6 U/mL 26.0 40.6 (H)       STUDIES: Last mammogram was in October of 2015 by Dr. Jamal Collin at outside institution and was reported to be negative. Another mammogram has been ordered by surgeon ASSESSMENT: Carcinoma of breast ER positive PR positive HER-2 positive disease patient is off and diarrhea hormone therapy Mammogram being done by surgeon at outside institution CEA to 7.29 is elevated in comparison to last year so a repeat CA 2 7.29 will be done in few weeks and if it continues to be high and PET scan will be ordered for restaging    Patient expressed understanding and was in agreement with this plan. She also understands that She can call clinic at any time with any questions, concerns, or complaints.    No matching staging information was found for the patient.  Forest Gleason, MD   04/28/2015 11:01 AM

## 2015-04-28 NOTE — Assessment & Plan Note (Signed)
Reviewed lab work from hematology this morning with normal CBC and CMP. Urinalysis done here was normal Colonoscopy reviewed was normal For now will observe patient's symptoms patient increase fiber  report any changes.

## 2015-04-28 NOTE — Progress Notes (Signed)
Patient does not have living will.  Former smoker. Stools have changed somewhat.  Seeing Dr. Jeananne Rama today.

## 2015-04-29 ENCOUNTER — Telehealth: Payer: Self-pay | Admitting: *Deleted

## 2015-04-29 DIAGNOSIS — C50212 Malignant neoplasm of upper-inner quadrant of left female breast: Secondary | ICD-10-CM

## 2015-04-29 LAB — CANCER ANTIGEN 27.29: CA 27.29: 40.6 U/mL — AB (ref 0.0–38.6)

## 2015-04-29 NOTE — Telephone Encounter (Signed)
-----   Message from Forest Gleason, MD sent at 04/29/2015  7:59 AM EDT ----- Regarding: high ca 27.29 He may have to repeat CEA to 7.29 in 8 weeks and see me after that

## 2015-04-29 NOTE — Telephone Encounter (Signed)
Called pt to inform of elevated ca27.29 and MD recommends to recheck in 8 weeks. Will schedule appt and follow up elevated ca27.29. Instructed pt to call back if has further questions.

## 2015-05-01 ENCOUNTER — Encounter: Payer: Self-pay | Admitting: Oncology

## 2015-05-02 ENCOUNTER — Telehealth: Payer: Self-pay | Admitting: *Deleted

## 2015-05-02 NOTE — Telephone Encounter (Signed)
asking for call back regarding the blood test Doctor wants [Other]

## 2015-05-02 NOTE — Telephone Encounter (Signed)
Called patient's home and due to patient's absence spoke with her husband.  Asked him to relay to patient the test the MD wants to do is a recheck of her tumor markers.  He verbalized understanding and said he would relay message to his wife.

## 2015-05-23 ENCOUNTER — Other Ambulatory Visit: Payer: Self-pay

## 2015-05-23 MED ORDER — METOPROLOL SUCCINATE ER 50 MG PO TB24: 25.0000 mg | ORAL_TABLET | Freq: Every day | ORAL | Status: DC

## 2015-05-23 NOTE — Telephone Encounter (Signed)
PATIENT: Toni Callahan DOB: [birth year] PHARMACY: [facility] LAST VISIT: 04/28/2015  Patient requests metoprolol succ er 50 mg

## 2015-05-26 ENCOUNTER — Other Ambulatory Visit: Payer: Self-pay

## 2015-05-26 ENCOUNTER — Ambulatory Visit: Payer: Self-pay

## 2015-05-27 ENCOUNTER — Ambulatory Visit
Admission: RE | Admit: 2015-05-27 | Discharge: 2015-05-27 | Disposition: A | Discharge status: Home / Self Care | Payer: Medicare Other | Source: Ambulatory Visit | Attending: General Surgery | Admitting: General Surgery

## 2015-05-27 ENCOUNTER — Other Ambulatory Visit: Payer: Self-pay | Admitting: General Surgery

## 2015-05-27 DIAGNOSIS — C50212 Malignant neoplasm of upper-inner quadrant of left female breast: Secondary | ICD-10-CM

## 2015-05-27 DIAGNOSIS — Z853 Personal history of malignant neoplasm of breast: Secondary | ICD-10-CM | POA: Diagnosis present

## 2015-05-27 HISTORY — DX: Malignant neoplasm of unspecified site of unspecified female breast: C50.919

## 2015-06-02 ENCOUNTER — Encounter: Payer: Self-pay | Admitting: General Surgery

## 2015-06-02 ENCOUNTER — Ambulatory Visit (INDEPENDENT_AMBULATORY_CARE_PROVIDER_SITE_OTHER): Payer: Medicare Other | Admitting: General Surgery

## 2015-06-02 VITALS — BP 148/80 | HR 78 | Resp 14 | Ht 64.0 in | Wt 168.0 lb

## 2015-06-02 DIAGNOSIS — C50212 Malignant neoplasm of upper-inner quadrant of left female breast: Secondary | ICD-10-CM | POA: Diagnosis not present

## 2015-06-02 NOTE — Progress Notes (Signed)
Patient ID: Toni Callahan, female   DOB: [birth year], 74 y.o.   MRN: [id number]  Chief Complaint  Patient presents with  . Follow-up    mammogram    HPI Toni Callahan is a 74 y.o. female who presents for a breast cancer follow up. The most recent mammogram was done on 05/26/15 . She is not currently taking [place] told her the medicine was cancelled 2 mos ago when she went to get refill.To her knowledge this was not discontinued by Dr. Oliva Bustard.. Patient does perform regular self breast checks and gets regular mammograms done.   I have reviewed the history of present illness with the patient.  HPI  Past Medical History  Diagnosis Date  . Allergy   . Personal history of tobacco use, presenting hazards to health   . Personal history of malignant neoplasm of breast 2009    left breast lumpectomy,chemotherapy and radiation therapy  . Special screening for malignant neoplasms, colon 2012  . Cancer ([place]) 2009    left breast, T1, N0, M0. ER/PR positive, HER2 neu positive  . Breast cancer ([place]) 2009    Past Surgical History  Procedure Laterality Date  . Portacath placement  2009  . Breast surgery Left 2009    lumpectomy  . Aspiration biopsy  2005    breast  . Tonsillectomy    . Colonoscopy  [id number]    Dr. Candace Cruise  . Port-a-cath removal  2011  . Breast cyst aspiration Bilateral   . Breast lumpectomy Left 2009    Family History  Problem Relation Age of Onset  . Stroke Mother   . Heart disease Father   . Diabetes Brother     Social History Social History  Substance Use Topics  . Smoking status: Former Smoker -- 20 years    Types: Cigarettes    Quit date: 08/06/1984  . Smokeless tobacco: Never Used     Comment: quit in 1986 approximately  . Alcohol Use: Yes     Comment: drinks rarely    Allergies  Allergen Reactions  . Sulfa Antibiotics Nausea Only    Current Outpatient Prescriptions  Medication Sig Dispense Refill  . aspirin 81 MG tablet Take 81 mg  by mouth daily.    . Biotin 5000 MCG CAPS Take 1 capsule by mouth daily.    . Calcium Carbonate (CALCIUM 600 PO) Take 1 tablet by mouth every other day.     . Cholecalciferol (VITAMIN D PO) Take 1,000 Int'l Units by mouth daily.    . Cyanocobalamin (VITAMIN B 12 PO) Take 500 mg by mouth daily.    . folic acid (FOLVITE) 408 MCG tablet Take 400 mcg by mouth daily.    Marland Kitchen lovastatin (MEVACOR) 40 MG tablet Take 0.5 tablets by mouth daily.    . Magnesium 250 MG TABS Take 1 tablet by mouth daily.    . metoprolol succinate (TOPROL-XL) 50 MG 24 hr tablet Take 1 tablet (50 mg total) by mouth daily. 30 tablet 2  . Multiple Vitamin (MULTIVITAMIN) tablet Take 1 tablet by mouth daily.    . Omega-3 Fatty Acids (FISH OIL) 1000 MG CAPS Take 1 capsule by mouth every other day.     . Potassium Gluconate 595 MG CAPS Take by mouth daily.    . vitamin C (ASCORBIC ACID) 500 MG tablet Take 500 mg by mouth every other day.      No current facility-administered medications for this visit.    Review of Systems Review of  Systems  Constitutional: Negative.   Respiratory: Negative.   Cardiovascular: Negative.     Blood pressure 148/80, pulse 78, resp. rate 14, height '5\' 4"'  (1.626 m), weight 168 lb (76.204 kg).  Physical Exam Physical Exam  Constitutional: She is oriented to person, place, and time. She appears well-developed and well-nourished.  Eyes: Conjunctivae are normal. No scleral icterus.  Neck: Neck supple.  Cardiovascular: Normal rate, regular rhythm and normal heart sounds.   Pulmonary/Chest: Effort normal and breath sounds normal. Right breast exhibits no inverted nipple, no mass, no nipple discharge, no skin change and no tenderness. Left breast exhibits no inverted nipple, no mass, no nipple discharge, no skin change and no tenderness.  Abdominal: Soft. Bowel sounds are normal. There is no tenderness.  Lymphadenopathy:    She has no cervical adenopathy.    She has no axillary adenopathy.   Neurological: She is alert and oriented to person, place, and time.  Skin: Skin is warm.    Data Reviewed Mammogram and recent labs reviewed. Mammogram stable, labs reviewed with mild elevation of CA 27.29. It is now 28, previously has been in the 20s.  Assessment    Stable exam, will discuss mild elevation of CA 27.29 and status of femara with Dr. Jeb Levering. No changes on recent mammogram.       Plan    Patient will be asked to return to the office in one year with a bilateral screening mammogram.      PCP:  Alex Gardener 06/02/2015, 11:33 AM

## 2015-06-02 NOTE — Patient Instructions (Addendum)
Patient will be asked to return to the office in one year with a bilateral screening mammogram. Call if any concerns arise. Continue monthly self breast exams

## 2015-06-07 ENCOUNTER — Encounter: Payer: Self-pay | Admitting: *Deleted

## 2015-06-21 ENCOUNTER — Inpatient Hospital Stay: Payer: Medicare Other | Attending: Oncology

## 2015-06-21 DIAGNOSIS — Z853 Personal history of malignant neoplasm of breast: Secondary | ICD-10-CM | POA: Insufficient documentation

## 2015-06-21 DIAGNOSIS — Z170 Estrogen receptor positive status [ER+]: Secondary | ICD-10-CM | POA: Insufficient documentation

## 2015-06-21 DIAGNOSIS — Z79899 Other long term (current) drug therapy: Secondary | ICD-10-CM | POA: Insufficient documentation

## 2015-06-21 DIAGNOSIS — Z7982 Long term (current) use of aspirin: Secondary | ICD-10-CM | POA: Diagnosis not present

## 2015-06-21 DIAGNOSIS — Z9223 Personal history of estrogen therapy: Secondary | ICD-10-CM | POA: Diagnosis not present

## 2015-06-21 DIAGNOSIS — C50212 Malignant neoplasm of upper-inner quadrant of left female breast: Secondary | ICD-10-CM

## 2015-06-21 DIAGNOSIS — Z87891 Personal history of nicotine dependence: Secondary | ICD-10-CM | POA: Diagnosis not present

## 2015-06-22 LAB — CANCER ANTIGEN 27.29: CA 27.29: 32.6 U/mL (ref 0.0–38.6)

## 2015-06-23 ENCOUNTER — Inpatient Hospital Stay (HOSPITAL_BASED_OUTPATIENT_CLINIC_OR_DEPARTMENT_OTHER): Payer: Medicare Other | Admitting: Oncology

## 2015-06-23 VITALS — BP 150/79 | HR 80 | Temp 96.9°F | Wt 171.3 lb

## 2015-06-23 DIAGNOSIS — Z87891 Personal history of nicotine dependence: Secondary | ICD-10-CM

## 2015-06-23 DIAGNOSIS — Z79899 Other long term (current) drug therapy: Secondary | ICD-10-CM

## 2015-06-23 DIAGNOSIS — Z7982 Long term (current) use of aspirin: Secondary | ICD-10-CM

## 2015-06-23 DIAGNOSIS — C50919 Malignant neoplasm of unspecified site of unspecified female breast: Secondary | ICD-10-CM

## 2015-06-23 DIAGNOSIS — Z170 Estrogen receptor positive status [ER+]: Secondary | ICD-10-CM

## 2015-06-23 DIAGNOSIS — Z9223 Personal history of estrogen therapy: Secondary | ICD-10-CM | POA: Diagnosis not present

## 2015-06-23 DIAGNOSIS — Z853 Personal history of malignant neoplasm of breast: Secondary | ICD-10-CM

## 2015-06-23 NOTE — Progress Notes (Signed)
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  Reports feeling well, no c/o.  Specifically denies fever, chills, HA, dizziness, chest pain, dyspnea, n/v, diarrhea/constipation, or pain.  Appetite good.   Patient is off letrozole.  Having no bony pain or bony fracture.  Getting regular mammograms done Patient has slightly elevated CA 27.29 during last visit which was repeated and it went back to close to normal.  In the neck September range.  Patient is here for ongoing evaluation also there is a question about to continue extended adjuvant therapy or not.  No bony pain.  Appetite has been stable.  Patient remains slightly anxious  REVIEW OF SYSTEMS:   GENERAL:  Feels good.  Active.  No fevers, sweats or weight loss. PERFORMANCE STATUS (ECOG):  01 HEENT:  No visual changes, runny nose, sore throat, mouth sores or tenderness. Lungs: No shortness of breath or cough.  No hemoptysis. Cardiac:  No chest pain, palpitations, orthopnea, or PND. GI:  No nausea, vomiting, diarrhea, constipation, melena or hematochezia. GU:  No urgency, frequency, dysuria, or hematuria. Musculoskeletal:  No back pain.  No joint pain.  No muscle tenderness. Extremities:  No pain or swelling. Skin:  No rashes or skin changes. Neuro:  No headache, numbness  or weakness, balance or coordination issues. Endocrine:  No diabetes, thyroid issues, hot flashes or night sweats. Psych:  No mood changes, depression or anxiety. Pain:  No focal pain. Review of systems:  All other systems reviewed and found to be negative.  As per HPI. Otherwise, a complete review of systems is negatve.  PAST MEDICAL HISTORY: Past Medical History  Diagnosis Date  . Allergy   . Personal history of tobacco use, presenting hazards to health   . Personal history of malignant neoplasm of breast 2009    left breast lumpectomy,chemotherapy and radiation therapy  . Special screening for malignant neoplasms, colon 2012  . Cancer ([place]) 2009    left breast, T1, N0, M0. ER/PR positive, HER2 neu positive  . Breast cancer ([place]) 2009    PAST SURGICAL HISTORY: Past Surgical History  Procedure Laterality Date  . Portacath placement  2009  . Breast surgery Left 2009    lumpectomy  . Aspiration biopsy  2005    breast  . Tonsillectomy    . Colonoscopy  1696,7893    Dr. Candace Cruise  . Port-a-cath removal  2011  . Breast cyst aspiration Bilateral   . Breast lumpectomy Left 2009    FAMILY HISTORY Family History  Problem Relation Age of Onset  . Stroke Mother   . Heart disease Father   . Diabetes Brother    status post lumpectomy and sentinel lymph node biopsy in July  of 2009   PFSH:  Family History noncontributory   Comments No family history of colorectal cancer, breast cancer, or ovarian cancer.  r   Additional Past Medical and Surgical History does not smoke at smoke several years ago. Previous use of estrogen therapy for several years. Off and now   ALLERGIES:   Sulfa drugs: GI Distress  ADVANCED DIRECTIVES:  Patient does not have any living will or healthcare power of attorney.  Information was given .  Available resources had been discussed.  We will follow-up on subsequent appointments regarding this issue HEALTH MAINTENANCE: Social History  Substance Use  Topics  . Smoking status: Former Smoker -- 20 years    Types: Cigarettes    Quit date: 08/06/1984  . Smokeless tobacco: Never Used     Comment: quit in 1986 approximately  . Alcohol Use: Yes     Comment: drinks rarely      Allergies  Allergen Reactions  . Sulfa Antibiotics Nausea Only    Current Outpatient Prescriptions  Medication Sig Dispense Refill  . aspirin 81 MG tablet Take 81 mg by mouth daily.    . Biotin 5000 MCG CAPS Take 1 capsule by mouth daily.    . Calcium Carbonate (CALCIUM 600 PO) Take 1 tablet by mouth every other day.     . Cholecalciferol (VITAMIN D PO) Take 1,000 Int'l Units by mouth daily.    . Cyanocobalamin (VITAMIN B 12 PO) Take 500 mg by mouth daily.    . folic acid (FOLVITE) 412 MCG tablet Take 400 mcg by mouth daily.    Marland Kitchen lovastatin (MEVACOR) 40 MG tablet Take 0.5 tablets by mouth daily.    . Magnesium 250 MG TABS Take 1 tablet by mouth daily.    . metoprolol succinate (TOPROL-XL) 50 MG 24 hr tablet Take 1 tablet (50 mg total) by mouth daily. 30 tablet 2  . Multiple Vitamin (MULTIVITAMIN) tablet Take 1 tablet by mouth daily.    . Omega-3 Fatty Acids (FISH OIL) 1000 MG CAPS Take 1 capsule by mouth every other day.     . Potassium Gluconate 595 MG CAPS Take by mouth daily.    . vitamin C (ASCORBIC ACID) 500 MG tablet Take 500 mg by mouth every other day.      No current facility-administered medications for this visit.    OBJECTIVE:  Filed Vitals:   06/23/15 1609  BP: 150/79  Pulse: 80  Temp: 96.9 F (36.1 C)     Body mass index is 29.39 kg/(m^2).    ECOG FS:1 - Symptomatic but completely ambulatory  PHYSICAL EXAM: General  status: Performance status is good.  Patient has not lost significant weight HEENT: No evidence of stomatitis. Sclera and conjunctivae :: No jaundice.   pale looking. Lungs: Air  entry equal on both sides.  No rhonchi.  No rales.  Cardiac: Heart sounds are normal.  No pericardial rub.  No murmur. Lymphatic system: Cervical,  axillary, inguinal, lymph nodes not palpable GI: Abdomen is soft.  No ascites.  Liver spleen not palpable.  No tenderness.  Bowel sounds are within normal limit Lower extremity: No edema Neurological system: Higher functions, cranial nerves intact no evidence of peripheral neuropathy. Skin: No rash.  No ecchymosis.. Examination of both breasts: No palpable masses.  Lymph nodes not palpable. No evidence of recurrent disease   LAB RESULTS:  CBC Latest Ref Rng 04/28/2015 04/30/2014  WBC 3.6 - 11.0 K/uL 6.5 7.5  Hemoglobin 12.0 - 16.0 g/dL 13.6 13.8  Hematocrit 35.0 - 47.0 % 40.2 40.7  Platelets 150 - 440 K/uL 227 245    Appointment on 06/21/2015  Component  Date Value Ref Range Status  . CA 27.29 06/21/2015 32.6  0.0 - 38.6 U/mL Final   Comment: (NOTE) [employer]/ACS methodology Performed At: [facility] [place], [place] [id number] Lindon Romp MD [id number]       Component     Latest Ref Rng 07/11/2010 01/16/2011 07/24/2011 02/06/2012 03/20/2013  CA 27.29     0.0 - 38.6 U/mL 29.4 25.3 21.2 23.1 21.3   Component     Latest Ref Rng 04/30/2014 04/28/2015  CA 27.29     0.0 - 38.6 U/mL 26.0 40.6 (H)   cancer of upper-inner quadrant...           Ref Range 6d ago  41moago  195yrgo  2y55yro     CA 27.29 0.0 - 38.6 U/mL 32.6               STUDIES: Last mammogram was in October of 2015 by Dr. SanJamal Collin outside institution and was reported to be negative. Another mammogram has been ordered by surgeon ASSESSMENT: Carcinoma of breast ER positive PR positive HER-2 positive disease she  is off and time hormone therapy Mammogram being done by surgeon at outside institution  CA 27.29 has returned back to 32.8 We also discuss about extended adjuvant therapy.  Explained that probably will run breast cancer index and decide on further options of continuing anti-hormonal therapy. Call patient after breast cancer index result is  available.      Patient expressed understanding and was in agreement with this plan. She also understands that She can call clinic at any time with any questions, concerns, or complaints.    No matching staging information was found for the patient.  JanForest GleasonD   06/23/2015 4:19 PM

## 2015-06-24 ENCOUNTER — Other Ambulatory Visit: Payer: Medicare Other

## 2015-06-27 ENCOUNTER — Encounter: Payer: Self-pay | Admitting: Oncology

## 2015-07-12 ENCOUNTER — Telehealth: Payer: Self-pay | Admitting: *Deleted

## 2015-07-12 MED ORDER — LETROZOLE 2.5 MG PO TABS
2.5000 mg | ORAL_TABLET | Freq: Every day | ORAL | Status: DC
Start: 2015-07-12 — End: 2016-07-09

## 2015-07-12 NOTE — Telephone Encounter (Signed)
Called patient to inform her that MD feels she would benefit from extended therapy with Letrozole.  A prescription has been called to her pharnacy.  Patient instructed to keep her appointment with MD 04-26-16.  Patient verbalized understanding and thanked me for the call.

## 2015-07-12 NOTE — Telephone Encounter (Signed)
Breast Cancer Index testing is available and shows that pt would benefit from extended endocrine therapy with letrozole. Pt needs to continue letrozole at this time until instructed by MD to stop. Refills for letrozole have been sent to pharmacy. Keep appt with Dr. Oliva Bustard on 04/26/16.

## 2015-07-26 ENCOUNTER — Ambulatory Visit (INDEPENDENT_AMBULATORY_CARE_PROVIDER_SITE_OTHER): Payer: Medicare Other | Admitting: Family Medicine

## 2015-07-26 ENCOUNTER — Encounter: Payer: Self-pay | Admitting: Family Medicine

## 2015-07-26 VITALS — BP 125/73 | HR 93 | Temp 99.1°F | Wt 167.0 lb

## 2015-07-26 DIAGNOSIS — R195 Other fecal abnormalities: Secondary | ICD-10-CM | POA: Diagnosis not present

## 2015-07-26 DIAGNOSIS — J069 Acute upper respiratory infection, unspecified: Secondary | ICD-10-CM | POA: Diagnosis not present

## 2015-07-26 NOTE — Assessment & Plan Note (Signed)
No blood, no mucous; no weight loss; discussed ddx; try probiotic daily for 3-4 weeks and return if needed for work-up

## 2015-07-26 NOTE — Patient Instructions (Addendum)
Try over-the-counter Mucinex DM (generic is fine) Try vitamin C (orange juice if not diabetic or vitamin C tablets) and drink green tea to help your immune system during your illness Get plenty of rest and hydration Try probiotics for 3-4 weeks and come back in for an appointment to discuss your bowel habits if not resuming normal  Upper Respiratory Infection, Adult Most upper respiratory infections (URIs) are caused by a virus. A URI affects the nose, throat, and upper air passages. The most common type of URI is often called "the common cold." HOME CARE   Take medicines only as told by your doctor.  Gargle warm saltwater or take cough drops to comfort your throat as told by your doctor.  Use a warm mist humidifier or inhale steam from a shower to increase air moisture. This may make it easier to breathe.  Drink enough fluid to keep your pee (urine) clear or pale yellow.  Eat soups and other clear broths.  Have a healthy diet.  Rest as needed.  Go back to work when your fever is gone or your doctor says it is okay.  You may need to stay home longer to avoid giving your URI to others.  You can also wear a face mask and wash your hands often to prevent spread of the virus.  Use your inhaler more if you have asthma.  Do not use any tobacco products, including cigarettes, chewing tobacco, or electronic cigarettes. If you need help quitting, ask your doctor. GET HELP IF:  You are getting worse, not better.  Your symptoms are not helped by medicine.  You have chills.  You are getting more short of breath.  You have brown or red mucus.  You have yellow or brown discharge from your nose.  You have pain in your face, especially when you bend forward.  You have a fever.  You have puffy (swollen) neck glands.  You have pain while swallowing.  You have white areas in the back of your throat. GET HELP RIGHT AWAY IF:   You have very bad or constant:  Headache.  Ear  pain.  Pain in your forehead, behind your eyes, and over your cheekbones (sinus pain).  Chest pain.  You have long-lasting (chronic) lung disease and any of the following:  Wheezing.  Long-lasting cough.  Coughing up blood.  A change in your usual mucus.  You have a stiff neck.  You have changes in your:  Vision.  Hearing.  Thinking.  Mood. MAKE SURE YOU:   Understand these instructions.  Will watch your condition.  Will get help right away if you are not doing well or get worse.   This information is not intended to replace advice given to you by your health care provider. Make sure you discuss any questions you have with your health care provider.   Document Released: 01/09/2008 Document Revised: 12/07/2014 Document Reviewed: 10/28/2013 Elsevier Interactive Patient Education [employer].

## 2015-07-26 NOTE — Progress Notes (Signed)
  BP 125/73 mmHg  Pulse 93  Temp(Src) 99.1 F (37.3 C)  Wt 167 lb (75.751 kg)  SpO2 97%   Subjective:    Patient ID: Toni Callahan, female    DOB: 01/20/1941, 74 y.o.   MRN: [id number]  HPI: Toni Callahan is a 73 y.o. female  Chief Complaint  Patient presents with  . URI    x 1 week, sinus and chest congestion. No fever. Some cough.   Symptoms started 3-4 days ago with sinus drainage, sore throat; throat is not sore any more; took OTC No headache, just congestion and stuffiness and runny nose; little bit of sneezing Coughing, just a little rattling, little bit of loose stuff No fevers Quit smoking 30 years ago with nicorette gum No ear symptoms  No stomach bug symptoms No rash No travel Husband has bronchitis and he has been sick 3 weeks, has seen his doctor 3 times and they are taking good care of him  Loose stools x 6 months; same diet, same activity; never constipated; not losing weight; no blood or mucous  Relevant past medical, surgical, family and social history reviewed and updated as indicated. Interim medical history since our last visit reviewed. Allergies and medications reviewed and updated.  Review of Systems Per HPI unless specifically indicated above     Objective:    BP 125/73 mmHg  Pulse 93  Temp(Src) 99.1 F (37.3 C)  Wt 167 lb (75.751 kg)  SpO2 97%  Wt Readings from Last 3 Encounters:  07/26/15 167 lb (75.751 kg)  06/23/15 171 lb 4.8 oz (77.7 kg)  06/02/15 168 lb (76.204 kg)    Physical Exam  Constitutional: She appears well-developed and well-nourished. No distress.  HENT:  Right Ear: Tympanic membrane, external ear and ear canal normal. Tympanic membrane is not erythematous. No middle ear effusion.  Left Ear: External ear and ear canal normal. Tympanic membrane is not erythematous.  No middle ear effusion.  Nose: Rhinorrhea present.  Mouth/Throat: Mucous membranes are normal. No oropharyngeal exudate, posterior oropharyngeal edema  or posterior oropharyngeal erythema.  Eyes: EOM are normal. No scleral icterus.  Cardiovascular: Normal rate and regular rhythm.   Pulmonary/Chest: Effort normal and breath sounds normal.  Lymphadenopathy:    She has no cervical adenopathy.  Skin: She is not diaphoretic.  Psychiatric: She has a normal mood and affect. Her behavior is normal.    Results for orders placed or performed in visit on 06/21/15  Cancer antigen 27.29  Result Value Ref Range   CA 27.29 32.6 0.0 - 38.6 U/mL      Assessment & Plan:   Problem List Items Addressed This Visit      Other   Loose stools    No blood, no mucous; no weight loss; discussed ddx; try probiotic daily for 3-4 weeks and return if needed for work-up       Other Visit Diagnoses    Upper respiratory infection    -  Primary    explained infectious, most likely viral; no need for ABX; see AVS; supportive care, watch for s/s of secondary bacterial infection, call if needed       Follow up plan: Return if symptoms worsen or fail to improve.  An after-visit summary was printed and given to the patient at [facility].  Please see the patient instructions which may contain other information and recommendations beyond what is mentioned above in the assessment and plan.

## 2015-08-23 ENCOUNTER — Encounter: Payer: Self-pay | Admitting: Family Medicine

## 2015-08-23 ENCOUNTER — Ambulatory Visit (INDEPENDENT_AMBULATORY_CARE_PROVIDER_SITE_OTHER): Payer: Medicare Other | Admitting: Family Medicine

## 2015-08-23 ENCOUNTER — Telehealth: Payer: Self-pay

## 2015-08-23 VITALS — BP 130/78 | HR 83 | Temp 98.2°F | Ht 65.0 in | Wt 166.0 lb

## 2015-08-23 DIAGNOSIS — R195 Other fecal abnormalities: Secondary | ICD-10-CM | POA: Diagnosis not present

## 2015-08-23 DIAGNOSIS — H938X1 Other specified disorders of right ear: Secondary | ICD-10-CM | POA: Diagnosis not present

## 2015-08-23 MED ORDER — ANTIPYRINE-BENZOCAINE 5.4-1.4 % OT SOLN: 3.0000 [drp] | Freq: Four times a day (QID) | OTIC | Status: DC | PRN

## 2015-08-23 NOTE — Patient Instructions (Signed)
Try the new ear drops for 5-7 days Let me know if your ear is still bothering you after that time We'll have you try the low FODMAP diet We'll refer you to Dr. Candace Cruise If you have not heard anything from my staff in a week about any orders/referrals/studies from today, please contact us here to follow-up [phone]

## 2015-08-23 NOTE — Telephone Encounter (Signed)
Please suggest that she get OTC Similasan Ear Relief drops; in a tan/light brown box; use for 2 days to see if that helps instead of the Rx I sent

## 2015-08-23 NOTE — Progress Notes (Signed)
BP 130/78 mmHg  Pulse 83  Temp(Src) 98.2 F (36.8 C)  Ht 5\' 5"  (1.651 m)  Wt 166 lb (75.297 kg)  BMI 27.62 kg/m2  SpO2 97%   Subjective:    Patient ID: Toni Callahan, female    DOB: [birth year], 76 y.o.   MRN: [id number]  HPI: Toni Callahan is a 75 y.o. female  Chief Complaint  Patient presents with  . Bowel Issues    she's still having some loose bowels. It is better than it was last time.  . Ear Fullness    she thinks she might have a wax build up.   She has ear fullness on the right; has used a q-tip; feels some irritation; her URI has cleared; she has gotten over her "crud"  She was seen December 20th; she had complained about loose stools and was going to try probiotics and then return if no improvement; she never got back to try the yogurt; her loose stools are a little better; still not solid; not as loose; no foul odor; no blood, she looks; sometimes dark, but mostly dark brown; no stomach cramps; last colonoscopy was October 16, 2010, Dr. Candace Cruise did the procedure and next would be due 10 years later; she would be willing to see Dr. Candace Cruise again; sometimes it is urgent; no stooling accidents  Relevant past medical, surgical, family and social history reviewed and updated as indicated. Interim medical history since our last visit reviewed. Allergies and medications reviewed and updated.  Review of Systems Per HPI unless specifically indicated above     Objective:    BP 130/78 mmHg  Pulse 83  Temp(Src) 98.2 F (36.8 C)  Ht 5\' 5"  (1.651 m)  Wt 166 lb (75.297 kg)  BMI 27.62 kg/m2  SpO2 97%  Wt Readings from Last 3 Encounters:  08/23/15 166 lb (75.297 kg)  07/26/15 167 lb (75.751 kg)  06/23/15 171 lb 4.8 oz (77.7 kg)    Physical Exam  Constitutional: She appears well-developed and well-nourished.  HENT:  Right Ear: Hearing and external ear normal.  Left Ear: Hearing, tympanic membrane, external ear and ear canal normal.  Mouth/Throat: Mucous membranes are not  dry.  Left canal has a clump of cerumen with cotton fibers admixed in it; the TM behind is slightly hyperemic  Cardiovascular: Normal rate and regular rhythm.   Pulmonary/Chest: Effort normal and breath sounds normal.  Abdominal: Soft. Normal appearance and bowel sounds are normal. She exhibits no distension and no mass. There is no tenderness. There is no guarding.  Skin: No pallor.  Psychiatric: She has a normal mood and affect.    Results for orders placed or performed in visit on 06/21/15  Cancer antigen 27.29  Result Value Ref Range   CA 27.29 32.6 0.0 - 38.6 U/mL      Assessment & Plan:   Problem List Items Addressed This Visit      Other   Loose stools - Primary    Reviewed her last colonoscopy report; normal; will have her try low FODMAP diet and refer to Dr. Candace Cruise      Relevant Orders   Ambulatory referral to Gastroenterology    Other Visit Diagnoses    Irritation of ear, right        discouraged use of cotton swabs; will try auralgan to tone down irritation; if symptoms persist after one week, then call me       Follow up plan: Return if symptoms worsen or fail to  improve.  An after-visit summary was printed and given to the patient at [facility].  Please see the patient instructions which may contain other information and recommendations beyond what is mentioned above in the assessment and plan.  [place] low FODMAP diet given  Meds ordered this encounter  Medications  . antipyrine-benzocaine (AURALGAN) otic solution    Sig: Place 3-4 drops into the right ear 4 (four) times daily as needed.    Dispense:  10 mL    Refill:  0

## 2015-08-23 NOTE — Telephone Encounter (Signed)
Pharmacy states that the Auralgan ear drops have been withdrawn from the market (both brand and generic). Can you please change it to something else?

## 2015-08-23 NOTE — Assessment & Plan Note (Addendum)
Reviewed her last colonoscopy report; normal; will have her try low FODMAP diet and refer to Dr. Candace Cruise

## 2015-08-24 NOTE — Telephone Encounter (Signed)
Patient stated that the medication given to her yesterday for ear pain is no longer available its been taken out for Rx antipyrine-benzocaine (AURALGAN) otic solution (Discontinued), if she can get something else.Thanks.

## 2015-08-24 NOTE — Telephone Encounter (Signed)
Patient notified

## 2015-08-31 ENCOUNTER — Encounter: Payer: Self-pay | Admitting: Oncology

## 2015-08-31 ENCOUNTER — Encounter: Payer: Self-pay | Admitting: *Deleted

## 2015-09-05 ENCOUNTER — Encounter: Payer: Medicare Other | Admitting: Family Medicine

## 2015-09-05 NOTE — Discharge Instructions (Signed)

## 2015-09-07 ENCOUNTER — Ambulatory Visit: Payer: Medicare Other | Admitting: Anesthesiology

## 2015-09-07 ENCOUNTER — Encounter: Payer: Medicare Other | Admitting: Family Medicine

## 2015-09-07 ENCOUNTER — Ambulatory Visit
Admission: RE | Admit: 2015-09-07 | Discharge: 2015-09-07 | Disposition: A | Discharge status: Home / Self Care | Payer: Medicare Other | Source: Ambulatory Visit | Attending: Gastroenterology | Admitting: Gastroenterology

## 2015-09-07 ENCOUNTER — Encounter
Admission: RE | Disposition: A | Discharge status: Home / Self Care | Payer: Self-pay | Source: Ambulatory Visit | Attending: Gastroenterology

## 2015-09-07 DIAGNOSIS — Z7982 Long term (current) use of aspirin: Secondary | ICD-10-CM | POA: Diagnosis not present

## 2015-09-07 DIAGNOSIS — R194 Change in bowel habit: Secondary | ICD-10-CM | POA: Diagnosis present

## 2015-09-07 DIAGNOSIS — Z882 Allergy status to sulfonamides status: Secondary | ICD-10-CM | POA: Insufficient documentation

## 2015-09-07 DIAGNOSIS — Z79899 Other long term (current) drug therapy: Secondary | ICD-10-CM | POA: Insufficient documentation

## 2015-09-07 DIAGNOSIS — Z87891 Personal history of nicotine dependence: Secondary | ICD-10-CM | POA: Insufficient documentation

## 2015-09-07 DIAGNOSIS — I10 Essential (primary) hypertension: Secondary | ICD-10-CM | POA: Insufficient documentation

## 2015-09-07 DIAGNOSIS — Z853 Personal history of malignant neoplasm of breast: Secondary | ICD-10-CM | POA: Insufficient documentation

## 2015-09-07 DIAGNOSIS — D122 Benign neoplasm of ascending colon: Secondary | ICD-10-CM | POA: Diagnosis not present

## 2015-09-07 DIAGNOSIS — E785 Hyperlipidemia, unspecified: Secondary | ICD-10-CM | POA: Insufficient documentation

## 2015-09-07 HISTORY — PX: POLYPECTOMY: SHX5525

## 2015-09-07 HISTORY — PX: COLONOSCOPY: SHX5424

## 2015-09-07 HISTORY — DX: Tachycardia, unspecified: R00.0

## 2015-09-07 HISTORY — DX: Presence of dental prosthetic device (complete) (partial): Z97.2

## 2015-09-07 SURGERY — COLONOSCOPY
Anesthesia: Monitor Anesthesia Care | Wound class: Contaminated

## 2015-09-07 MED ORDER — LACTATED RINGERS IV SOLN
INTRAVENOUS | Status: DC
  Administered 2015-09-07: 10:00:00 via INTRAVENOUS

## 2015-09-07 MED ORDER — STERILE WATER FOR IRRIGATION IR SOLN
Status: DC | PRN
  Administered 2015-09-07: 11:00:00

## 2015-09-07 MED ORDER — OXYCODONE HCL 5 MG/5ML PO SOLN
5.0000 mg | Freq: Once | ORAL | Status: DC | PRN
Start: 2015-09-07 — End: 2015-09-07

## 2015-09-07 MED ORDER — LIDOCAINE HCL (CARDIAC) 20 MG/ML IV SOLN
INTRAVENOUS | Status: DC | PRN
  Administered 2015-09-07: 50 mg via INTRAVENOUS

## 2015-09-07 MED ORDER — HYDROMORPHONE HCL 1 MG/ML IJ SOLN: 0.2500 mg | INTRAMUSCULAR | Status: DC | PRN

## 2015-09-07 MED ORDER — PROPOFOL 10 MG/ML IV BOLUS
INTRAVENOUS | Status: DC | PRN
  Administered 2015-09-07 (×3): 50 mg via INTRAVENOUS

## 2015-09-07 MED ORDER — MEPERIDINE HCL 25 MG/ML IJ SOLN: 6.2500 mg | INTRAMUSCULAR | Status: DC | PRN

## 2015-09-07 MED ORDER — PROMETHAZINE HCL 25 MG/ML IJ SOLN: 6.2500 mg | INTRAMUSCULAR | Status: DC | PRN

## 2015-09-07 MED ORDER — OXYCODONE HCL 5 MG PO TABS: 5.0000 mg | ORAL_TABLET | Freq: Once | ORAL | Status: DC | PRN

## 2015-09-07 SURGICAL SUPPLY — 30 items
CANISTER SUCT 1200ML W/VALVE (MISCELLANEOUS) ×3 IMPLANT
FCP ESCP3.2XJMB 240X2.8X (MISCELLANEOUS)
FORCEPS BIOP RAD 4 LRG CAP 4 (CUTTING FORCEPS) ×1 IMPLANT
FORCEPS BIOP RJ4 240 W/NDL (MISCELLANEOUS)
FORCEPS ESCP3.2XJMB 240X2.8X (MISCELLANEOUS) IMPLANT
GOWN CVR UNV OPN BCK APRN NK (MISCELLANEOUS) ×2 IMPLANT
GOWN ISOL THUMB LOOP REG UNIV (MISCELLANEOUS) ×3
GOWN STRL REUS W/ TWL LRG LVL3 (GOWN DISPOSABLE) ×2 IMPLANT
GOWN STRL REUS W/TWL LRG LVL3 (GOWN DISPOSABLE) ×3
HEMOCLIP INSTINCT (CLIP) IMPLANT
INJECTOR VARIJECT VIN23 (MISCELLANEOUS) IMPLANT
KIT CO2 TUBING (TUBING) IMPLANT
KIT DEFENDO VALVE AND CONN (KITS) IMPLANT
KIT ENDO PROCEDURE OLY (KITS) ×3 IMPLANT
LIGATOR MULTIBAND 6SHOOTER MBL (MISCELLANEOUS) IMPLANT
MARKER SPOT ENDO TATTOO 5ML (MISCELLANEOUS) IMPLANT
PAD GROUND ADULT SPLIT (MISCELLANEOUS) IMPLANT
SNARE SHORT THROW 13M SML OVAL (MISCELLANEOUS) ×1 IMPLANT
SNARE SHORT THROW 30M LRG OVAL (MISCELLANEOUS) IMPLANT
SPOT EX ENDOSCOPIC TATTOO (MISCELLANEOUS)
SUCTION POLY TRAP 4CHAMBER (MISCELLANEOUS) IMPLANT
TRAP SUCTION POLY (MISCELLANEOUS) ×1 IMPLANT
TUBING CONN 6MMX3.1M (TUBING)
TUBING SUCTION CONN 0.25 STRL (TUBING) IMPLANT
UNDERPAD 30X60 958B10 (PK) (MISCELLANEOUS) IMPLANT
VALVE BIOPSY ENDO (VALVE) IMPLANT
VARIJECT INJECTOR VIN23 (MISCELLANEOUS)
WATER AUXILLARY (MISCELLANEOUS) IMPLANT
WATER STERILE IRR 250ML POUR (IV SOLUTION) IMPLANT
WATER STERILE IRR 500ML POUR (IV SOLUTION) IMPLANT

## 2015-09-07 NOTE — Transfer of Care (Signed)
Immediate Anesthesia Transfer of Care Note  Patient: Toni Callahan  Procedure(s) Performed: Procedure(s): COLONOSCOPY (N/A) POLYPECTOMY  Patient Location: PACU  Anesthesia Type: MAC  Level of Consciousness: awake, alert  and patient cooperative  Airway and Oxygen Therapy: Patient Spontanous Breathing and Patient connected to supplemental oxygen  Post-op Assessment: Post-op Vital signs reviewed, Patient's Cardiovascular Status Stable, Respiratory Function Stable, Patent Airway and No signs of Nausea or vomiting  Post-op Vital Signs: Reviewed and stable  Complications: No apparent anesthesia complications

## 2015-09-07 NOTE — Anesthesia Postprocedure Evaluation (Signed)
Anesthesia Post Note  Patient: Toni Callahan  Procedure(s) Performed: Procedure(s) (LRB): COLONOSCOPY (N/A) POLYPECTOMY  Patient location during evaluation: PACU Anesthesia Type: MAC Level of consciousness: awake and awake and alert Pain management: pain level controlled Vital Signs Assessment: post-procedure vital signs reviewed and stable Respiratory status: spontaneous breathing and nonlabored ventilation Cardiovascular status: stable Postop Assessment: no signs of nausea or vomiting and adequate PO intake Anesthetic complications: no    Estill Batten

## 2015-09-07 NOTE — H&P (Signed)
  Date of Initial H&P: 08/30/2015  History reviewed, patient examined, no change in status, stable for surgery. 

## 2015-09-07 NOTE — Anesthesia Procedure Notes (Signed)
Procedure Name: MAC Performed by: Shandi Godfrey Pre-anesthesia Checklist: Patient identified, Emergency Drugs available, Suction available, Timeout performed and Patient being monitored Patient Re-evaluated:Patient Re-evaluated prior to inductionOxygen Delivery Method: Nasal cannula Placement Confirmation: positive ETCO2     

## 2015-09-07 NOTE — Anesthesia Preprocedure Evaluation (Signed)
Anesthesia Evaluation  Patient identified by MRN, date of birth, ID band Patient awake    Reviewed: Allergy & Precautions, NPO status , Patient's Chart, lab work & pertinent test results, reviewed documented beta blocker date and time   Airway Mallampati: I  TM Distance: >3 FB Neck ROM: Full    Dental no notable dental hx.    Pulmonary former smoker,    Pulmonary exam normal        Cardiovascular hypertension, Pt. on medications and Pt. on home beta blockers Normal cardiovascular exam     Neuro/Psych    GI/Hepatic negative GI ROS, Neg liver ROS,   Endo/Other  negative endocrine ROS  Renal/GU negative Renal ROS     Musculoskeletal negative musculoskeletal ROS (+)   Abdominal   Peds  Hematology negative hematology ROS (+)   Anesthesia Other Findings   Reproductive/Obstetrics                             Anesthesia Physical Anesthesia Plan  ASA: II  Anesthesia Plan: MAC   Post-op Pain Management:    Induction: Intravenous  Airway Management Planned:   Additional Equipment:   Intra-op Plan:   Post-operative Plan:   Informed Consent: I have reviewed the patients History and Physical, chart, labs and discussed the procedure including the risks, benefits and alternatives for the proposed anesthesia with the patient or authorized representative who has indicated his/her understanding and acceptance.     Plan Discussed with: CRNA  Anesthesia Plan Comments:         Anesthesia Quick Evaluation

## 2015-09-07 NOTE — Op Note (Signed)
[facility]astroenterology Patient Name: Toni Callahan Procedure Date: 09/07/2015 10:16 AM MRN: [id number] Account #: [id number] Date of Birth: Dec 03, 1940 Admit Type: Outpatient Age: 75 Room: [facility] OR ROOM 01 Gender: Female Note Status: Finalized Procedure:         Colonoscopy Indications:       Change in bowel habits Providers:         Lupita Dawn. Candace Cruise, MD Referring MD:      Guadalupe Maple, MD (Referring MD) Medicines:         Monitored Anesthesia Care Complications:     No immediate complications. Procedure:         Pre-Anesthesia Assessment:                    - Prior to the procedure, a History and Physical was                     performed, and patient medications, allergies and                     sensitivities were reviewed. The patient's tolerance of                     previous anesthesia was reviewed.                    - The risks and benefits of the procedure and the sedation                     options and risks were discussed with the patient. All                     questions were answered and informed consent was obtained.                    - After reviewing the risks and benefits, the patient was                     deemed in satisfactory condition to undergo the procedure.                    After obtaining informed consent, the colonoscope was                     passed under direct vision. Throughout the procedure, the                     patient's blood pressure, pulse, and oxygen saturations                     were monitored continuously. The Olympus CF-HQ190L                     Colonoscope (S#. [id number]) was introduced through the anus                     and advanced to the the cecum, identified by appendiceal                     orifice and ileocecal valve. The colonoscopy was performed                     without difficulty. The patient tolerated the procedure  well. The quality of the bowel preparation was  good. Findings:      Two sessile polyps were found in the ascending colon. The polyps were       small in size. These polyps were removed with a cold snare. Resection       and retrieval were complete.      The exam was otherwise without abnormality.      Random colon biopsies taken throughout the colon. Impression:        - Two small polyps in the ascending colon. Resected and                     retrieved.                    - The examination was otherwise normal. Recommendation:    - Discharge patient to home.                    - Await pathology results.                    - The findings and recommendations were discussed with the                     patient. 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 All rights reserved. The codes documented in this report are preliminary and upon coder review may  be revised to meet current compliance requirements. Hulen Luster, MD 09/07/2015 10:41:45 AM This report has been signed electronically. Number of Addenda: 0 Note Initiated On: 09/07/2015 10:16 AM Scope Withdrawal Time: 0 hours 6 minutes 35 seconds  Total Procedure Duration: 0 hours 9 minutes 14 seconds       [facility]

## 2015-09-08 ENCOUNTER — Encounter: Payer: Self-pay | Admitting: Gastroenterology

## 2015-09-09 LAB — SURGICAL PATHOLOGY

## 2015-09-16 ENCOUNTER — Ambulatory Visit (INDEPENDENT_AMBULATORY_CARE_PROVIDER_SITE_OTHER): Payer: Medicare Other | Admitting: Family Medicine

## 2015-09-16 ENCOUNTER — Encounter: Payer: Self-pay | Admitting: Family Medicine

## 2015-09-16 VITALS — BP 123/70 | HR 74 | Temp 97.6°F | Ht 65.0 in | Wt 167.0 lb

## 2015-09-16 DIAGNOSIS — C50212 Malignant neoplasm of upper-inner quadrant of left female breast: Secondary | ICD-10-CM | POA: Diagnosis not present

## 2015-09-16 DIAGNOSIS — R739 Hyperglycemia, unspecified: Secondary | ICD-10-CM

## 2015-09-16 DIAGNOSIS — E782 Mixed hyperlipidemia: Secondary | ICD-10-CM

## 2015-09-16 DIAGNOSIS — Z5181 Encounter for therapeutic drug level monitoring: Secondary | ICD-10-CM

## 2015-09-16 DIAGNOSIS — Z0000 Encounter for general adult medical examination without abnormal findings: Secondary | ICD-10-CM

## 2015-09-16 DIAGNOSIS — Z7189 Other specified counseling: Secondary | ICD-10-CM

## 2015-09-16 DIAGNOSIS — Z780 Asymptomatic menopausal state: Secondary | ICD-10-CM | POA: Insufficient documentation

## 2015-09-16 DIAGNOSIS — I10 Essential (primary) hypertension: Secondary | ICD-10-CM | POA: Diagnosis not present

## 2015-09-16 DIAGNOSIS — Z13820 Encounter for screening for osteoporosis: Secondary | ICD-10-CM

## 2015-09-16 NOTE — Patient Instructions (Addendum)
Return for fasting labs in the next 1-2 weeks Please do call to schedule your bone density study; the number to schedule one at either [facility] or [facility] is [phone]  [facility] staff -- get patient a copy of the [facility] and the advanced directive booklet  Have a copy of your HCPOA, living will, medicare cards and any supplements, list of medicines, list of allergies, list of specialists, EKG  Immunization History  Administered Date(s) Administered  . Influenza-Unspecified 04/09/2015  . Pneumococcal Conjugate-13 03/02/2014  . Pneumococcal Polysaccharide-23 05/01/2005  . Td 08/07/2003  . Tdap 08/25/2013  . Zoster 05/16/2006   Health Maintenance  Topic Date Due  . INFLUENZA VACCINE  03/06/2016  . TETANUS/TDAP  08/26/2023  . DEXA SCAN  Addressed  . ZOSTAVAX  Completed  . PNA vac Low Risk Adult  Completed   DEXA scan in the next few weeks at your convenience Mammograms and breast care through your surgeon   Menopause is a normal process in which your reproductive ability comes to an end. This process happens gradually over a span of months to years, usually between the ages of 78 and 58. Menopause is complete when you have missed 12 consecutive menstrual periods. It is important to talk with your health care provider about some of the most common conditions that affect postmenopausal women, such as heart disease, cancer, and bone loss (osteoporosis). Adopting a healthy lifestyle and getting preventive care can help to promote your health and wellness. Those actions can also lower your chances of developing some of these common conditions. WHAT SHOULD I KNOW ABOUT MENOPAUSE? During menopause, you may experience a number of symptoms, such as:  Moderate-to-severe hot flashes.  Night sweats.  Decrease in sex drive.  Mood swings.  Headaches.  Tiredness.  Irritability.  Memory problems.  Insomnia. Choosing to treat or not to treat  menopausal changes is an individual decision that you make with your health care provider. WHAT SHOULD I KNOW ABOUT HORMONE REPLACEMENT THERAPY AND SUPPLEMENTS? Hormone therapy products are effective for treating symptoms that are associated with menopause, such as hot flashes and night sweats. Hormone replacement carries certain risks, especially as you become older. If you are thinking about using estrogen or estrogen with progestin treatments, discuss the benefits and risks with your health care provider. WHAT SHOULD I KNOW ABOUT HEART DISEASE AND STROKE? Heart disease, heart attack, and stroke become more likely as you age. This may be due, in part, to the hormonal changes that your body experiences during menopause. These can affect how your body processes dietary fats, triglycerides, and cholesterol. Heart attack and stroke are both medical emergencies. There are many things that you can do to help prevent heart disease and stroke:  Have your blood pressure checked at least every 1-2 years. High blood pressure causes heart disease and increases the risk of stroke.  If you are 4-28 years old, ask your health care provider if you should take aspirin to prevent a heart attack or a stroke.  Do not use any tobacco products, including cigarettes, chewing tobacco, or electronic cigarettes. If you need help quitting, ask your health care provider.  It is important to eat a healthy diet and maintain a healthy weight.  Be sure to include plenty of vegetables, fruits, low-fat dairy products, and lean protein.  Avoid eating foods that are high in solid fats, added sugars, or salt (sodium).  Get regular exercise. This is one of the most important things  that you can do for your health.  Try to exercise for at least 150 minutes each week. The type of exercise that you do should increase your heart rate and make you sweat. This is known as moderate-intensity exercise.  Try to do strengthening  exercises at least twice each week. Do these in addition to the moderate-intensity exercise.  Know your numbers.Ask your health care provider to check your cholesterol and your blood glucose. Continue to have your blood tested as directed by your health care provider. WHAT SHOULD I KNOW ABOUT CANCER SCREENING? There are several types of cancer. Take the following steps to reduce your risk and to catch any cancer development as early as possible. Breast Cancer  Practice breast self-awareness.  This means understanding how your breasts normally appear and feel.  It also means doing regular breast self-exams. Let your health care provider know about any changes, no matter how small.  If you are 74 or older, have a clinician do a breast exam (clinical breast exam or CBE) every year. Depending on your age, family history, and medical history, it may be recommended that you also have a yearly breast X-ray (mammogram).  If you have a family history of breast cancer, talk with your health care provider about genetic screening.  If you are at high risk for breast cancer, talk with your health care provider about having an MRI and a mammogram every year.  Breast cancer (BRCA) gene test is recommended for women who have family members with BRCA-related cancers. Results of the assessment will determine the need for genetic counseling and BRCA1 and for BRCA2 testing. BRCA-related cancers include these types:  Breast. This occurs in males or females.  Ovarian.  Tubal. This may also be called fallopian tube cancer.  Cancer of the abdominal or pelvic lining (peritoneal cancer).  Prostate.  Pancreatic. Cervical, Uterine, and Ovarian Cancer Your health care provider may recommend that you be screened regularly for cancer of the pelvic organs. These include your ovaries, uterus, and vagina. This screening involves a pelvic exam, which includes checking for microscopic changes to the surface of your  cervix (Pap test).  For women ages 21-65, health care providers may recommend a pelvic exam and a Pap test every three years. For women ages 31-65, they may recommend the Pap test and pelvic exam, combined with testing for human papilloma virus (HPV), every five years. Some types of HPV increase your risk of cervical cancer. Testing for HPV may also be done on women of any age who have unclear Pap test results.  Other health care providers may not recommend any screening for nonpregnant women who are considered low risk for pelvic cancer and have no symptoms. Ask your health care provider if a screening pelvic exam is right for you.  If you have had past treatment for cervical cancer or a condition that could lead to cancer, you need Pap tests and screening for cancer for at least 20 years after your treatment. If Pap tests have been discontinued for you, your risk factors (such as having a new sexual partner) need to be reassessed to determine if you should start having screenings again. Some women have medical problems that increase the chance of getting cervical cancer. In these cases, your health care provider may recommend that you have screening and Pap tests more often.  If you have a family history of uterine cancer or ovarian cancer, talk with your health care provider about genetic screening.  If you  have vaginal bleeding after reaching menopause, tell your health care provider.  There are currently no reliable tests available to screen for ovarian cancer. Lung Cancer Lung cancer screening is recommended for adults 43-41 years old who are at high risk for lung cancer because of a history of smoking. A yearly low-dose CT scan of the lungs is recommended if you:  Currently smoke.  Have a history of at least 30 pack-years of smoking and you currently smoke or have quit within the past 15 years. A pack-year is smoking an average of one pack of cigarettes per day for one year. Yearly  screening should:  Continue until it has been 15 years since you quit.  Stop if you develop a health problem that would prevent you from having lung cancer treatment. Colorectal Cancer  This type of cancer can be detected and can often be prevented.  Routine colorectal cancer screening usually begins at age 68 and continues through age 73.  If you have risk factors for colon cancer, your health care provider may recommend that you be screened at an earlier age.  If you have a family history of colorectal cancer, talk with your health care provider about genetic screening.  Your health care provider may also recommend using home test kits to check for hidden blood in your stool.  A small camera at the end of a tube can be used to examine your colon directly (sigmoidoscopy or colonoscopy). This is done to check for the earliest forms of colorectal cancer.  Direct examination of the colon should be repeated every 5-10 years until age 80. However, if early forms of precancerous polyps or small growths are found or if you have a family history or genetic risk for colorectal cancer, you may need to be screened more often. Skin Cancer  Check your skin from head to toe regularly.  Monitor any moles. Be sure to tell your health care provider:  About any new moles or changes in moles, especially if there is a change in a mole's shape or color.  If you have a mole that is larger than the size of a pencil eraser.  If any of your family members has a history of skin cancer, especially at a young age, talk with your health care provider about genetic screening.  Always use sunscreen. Apply sunscreen liberally and repeatedly throughout the day.  Whenever you are outside, protect yourself by wearing long sleeves, pants, a wide-brimmed hat, and sunglasses. WHAT SHOULD I KNOW ABOUT OSTEOPOROSIS? Osteoporosis is a condition in which bone destruction happens more quickly than new bone creation. After  menopause, you may be at an increased risk for osteoporosis. To help prevent osteoporosis or the bone fractures that can happen because of osteoporosis, the following is recommended:  If you are 94-84 years old, get at least 1,000 mg of calcium and at least 600 mg of vitamin D per day.  If you are older than age 63 but younger than age 67, get at least 1,200 mg of calcium and at least 600 mg of vitamin D per day.  If you are older than age 73, get at least 1,200 mg of calcium and at least 800 mg of vitamin D per day. Smoking and excessive alcohol intake increase the risk of osteoporosis. Eat foods that are rich in calcium and vitamin D, and do weight-bearing exercises several times each week as directed by your health care provider. WHAT SHOULD I KNOW ABOUT HOW MENOPAUSE AFFECTS MY MENTAL  HEALTH? Depression may occur at any age, but it is more common as you become older. Common symptoms of depression include:  Low or sad mood.  Changes in sleep patterns.  Changes in appetite or eating patterns.  Feeling an overall lack of motivation or enjoyment of activities that you previously enjoyed.  Frequent crying spells. Talk with your health care provider if you think that you are experiencing depression. WHAT SHOULD I KNOW ABOUT IMMUNIZATIONS? It is important that you get and maintain your immunizations. These include:  Tetanus, diphtheria, and pertussis (Tdap) booster vaccine.  Influenza every year before the flu season begins.  Pneumonia vaccine.  Shingles vaccine. Your health care provider may also recommend other immunizations.   This information is not intended to replace advice given to you by your health care provider. Make sure you discuss any questions you have with your health care provider.   Document Released: 09/14/2005 Document Revised: 08/13/2014 Document Reviewed: 03/25/2014 Elsevier Interactive Patient Education 2016 [employer] Cholesterol Cholesterol is a white,  waxy, fat-like substance needed by your body in small amounts. The liver makes all the cholesterol you need. Cholesterol is carried from the liver by the blood through the blood vessels. Deposits of cholesterol (plaque) may build up on blood vessel walls. These make the arteries narrower and stiffer. Cholesterol plaques increase the risk for heart attack and stroke.  You cannot feel your cholesterol level even if it is very high. The only way to know it is high is with a blood test. Once you know your cholesterol levels, you should keep a record of the test results. Work with your health care provider to keep your levels in the desired range.  WHAT DO THE RESULTS MEAN?  Total cholesterol is a rough measure of all the cholesterol in your blood.   LDL is the so-called bad cholesterol. This is the type that deposits cholesterol in the walls of the arteries. You want this level to be low.   HDL is the good cholesterol because it cleans the arteries and carries the LDL away. You want this level to be high.  Triglycerides are fat that the body can either burn for energy or store. High levels are closely linked to heart disease.  WHAT ARE THE DESIRED LEVELS OF CHOLESTEROL?  Total cholesterol below 200.   LDL below 100 for people at risk, below 70 for those at very high risk.   HDL above 50 is good, above 60 is best.   Triglycerides below 150.  HOW CAN I LOWER MY CHOLESTEROL?  Diet. Follow your diet programs as directed by your health care provider.   Choose fish or white meat chicken and [place], roasted or baked. Limit fatty cuts of red meat, fried foods, and processed meats, such as sausage and lunch meats.   Eat lots of fresh fruits and vegetables.  Choose whole grains, beans, pasta, potatoes, and cereals.   Use only small amounts of olive, corn, or canola oils.   Avoid butter, mayonnaise, shortening, or palm kernel oils.  Avoid foods with trans fats.   Drink skim or nonfat  milk and eat low-fat or nonfat yogurt and cheeses. Avoid whole milk, cream, ice cream, egg yolks, and full-fat cheeses.   Healthy desserts include angel food cake, ginger snaps, animal crackers, hard candy, popsicles, and low-fat or nonfat frozen yogurt. Avoid pastries, cakes, pies, and cookies.   Exercise. Follow your exercise programs as directed by your health care provider.   A regular program helps  decrease LDL and raise HDL.   A regular program helps with weight control.   Do things that increase your activity level like gardening, walking, or taking the stairs. Ask your health care provider about how you can be more active in your daily life.   Medicine. Take medicine only as directed by your health care provider.   Medicine may be prescribed by your health care provider to help lower cholesterol and decrease the risk for heart disease.   If you have several risk factors, you may need medicine even if your levels are normal.   This information is not intended to replace advice given to you by your health care provider. Make sure you discuss any questions you have with your health care provider.   Document Released: 04/17/2001 Document Revised: 08/13/2014 Document Reviewed: 05/06/2013 Elsevier Interactive Patient Education [employer].

## 2015-09-16 NOTE — Progress Notes (Signed)
Patient: Toni Callahan, Female    DOB: [birth year], 75 y.o.   MRN: [id number]  Visit Date: Sep 16, 2015  Today's Provider: Enid Derry, MD   Chief Complaint  Patient presents with  . Annual Exam    Medicare Wellness Exam    Subjective:   Toni Callahan is a 75 y.o. female who presents today for her Subsequent Annual Wellness Visit.  Caregiver input:  n/a  Saw Dr. Michel Bickers NP saw her yesterday at the cardiology office; all is okay; BP was good  USPSTF grade A and B recommendations Alcohol:  Wine; about a glass a day Depression:  Depression screen [facility] 2/9 09/16/2015  Decreased Interest 0  Down, Depressed, Hopeless 0  PHQ - 2 Score 0   Hypertension: controlled Obesity: no Tobacco use: previous, quit over 30 years ago HIV, hep B, hep C: politely declined STD testing and prevention (chl/gon/syphilis): no Lipids: return for fasting labs Glucose:  Return for fasting glucose Colorectal cancer: UTD Breast cancer: sees dr. Jamal Collin and Dr. Oliva Bustard, next appt in the fall; she stopped the medicine for a little bit and the CA27.29 went up, back on medicine, and came right back down Intimate partner violence: no Cervical cancer screening: graduated Lung cancer: n/a Osteoporosis: order Fall prevention/vitamin D: taking vit D, fall precautions AAA: n/a Aspirin: aspirin Diet: pretty good diet Exercise: active and wlaking Skin cancer: goes to Dr. Nehemiah Massed, derm  HPI  Review of Systems  Past Medical History  Diagnosis Date  . Allergy   . Personal history of tobacco use, presenting hazards to health   . Personal history of malignant neoplasm of breast 2009    left breast lumpectomy,chemotherapy and radiation therapy  . Special screening for malignant neoplasms, colon 2012  . Cancer ([place]) 2009    left breast, T1, N0, M0. ER/PR positive, HER2 neu positive  . Breast cancer ([place]) 2009  . Tachycardia   . Dental bridge present     permanant - upper    Past Surgical History   Procedure Laterality Date  . Portacath placement  2009  . Tonsillectomy    . Colonoscopy  [id number]    Dr. Candace Cruise  . Port-a-cath removal  2011  . Cataract extraction w/ intraocular lens implant Bilateral   . Breast surgery Left 2009    lumpectomy  . Breast cyst aspiration Bilateral 2005  . Colonoscopy N/A 09/07/2015    Procedure: COLONOSCOPY;  Surgeon: Hulen Luster, MD;  Location: [place];  Service: Gastroenterology;  Laterality: N/A;  . Polypectomy  09/07/2015    Procedure: POLYPECTOMY;  Surgeon: Hulen Luster, MD;  Location: [place];  Service: Gastroenterology;;  reviewed path report, no cancer  Family History  Problem Relation Age of Onset  . Stroke Mother   . Heart disease Father   . Diabetes Brother   . Cancer Neg Hx   . COPD Neg Hx   . Hypertension Neg Hx    Social History   Social History  . Marital Status: Married    Spouse Name: N/A  . Number of Children: N/A  . Years of Education: N/A   Occupational History  . Not on file.   Social History Main Topics  . Smoking status: Former Smoker -- 20 years    Types: Cigarettes    Quit date: 08/06/1984  . Smokeless tobacco: Never Used     Comment: quit in 1986 approximately  . Alcohol Use: Yes     Comment: drinks rarely  .  Drug Use: No  . Sexual Activity: Yes   Other Topics Concern  . Not on file   Social History Narrative   Outpatient Encounter Prescriptions as of 09/16/2015  Medication Sig Note  . aspirin 81 MG tablet Take 81 mg by mouth daily.   . Biotin 5000 MCG CAPS Take 1 capsule by mouth daily.   . Calcium Carbonate (CALCIUM 600 PO) Take 1 tablet by mouth every other day.    . Cholecalciferol (VITAMIN D PO) Take 1,000 Int'l Units by mouth daily.   . Cyanocobalamin (VITAMIN B 12 PO) Take 500 mg by mouth daily.   . folic acid (FOLVITE) 888 MCG tablet Take 400 mcg by mouth daily.   Marland Kitchen letrozole (FEMARA) 2.5 MG tablet Take 1 tablet (2.5 mg total) by mouth daily.   Marland Kitchen lovastatin (MEVACOR) 20 MG  tablet Take 20 mg by mouth every other day.  09/16/2015: Received from: External Pharmacy Received Sig:   . Magnesium 250 MG TABS Take 1 tablet by mouth daily.   . metoprolol succinate (TOPROL-XL) 50 MG 24 hr tablet Take 1 tablet (50 mg total) by mouth daily.   . Multiple Vitamin (MULTIVITAMIN) tablet Take 1 tablet by mouth daily.   . Omega-3 Fatty Acids (FISH OIL) 1000 MG CAPS Take 1 capsule by mouth every other day.    . Potassium Gluconate 595 MG CAPS Take by mouth daily.   . vitamin C (ASCORBIC ACID) 500 MG tablet Take 500 mg by mouth every other day.    . [DISCONTINUED] lovastatin (MEVACOR) 40 MG tablet Take 0.5 tablets by mouth daily. Reported on 09/16/2015 05/28/2013: Received from: External Pharmacy Received Sig:    No facility-administered encounter medications on file as of 09/16/2015.    Functional Ability / Safety Screening 1.  Was the timed Get Up and Go test longer than 30 seconds?  no 2.  Does the patient need help with the phone, transportation, shopping,      preparing meals, housework, laundry, medications, or managing money? no 3.  Does the patient's home have:  loose throw rugs in the hallway?   no      Grab bars in the bathroom? yes      Handrails on the stairs?   yes      Poor lighting?   yes 4.  Has the patient noticed any hearing difficulties?   no  Fall Risk Assessment See under rooming  Depression Screen See under rooming Depression screen [facility] 2/9 09/16/2015  Decreased Interest 0  Down, Depressed, Hopeless 0  PHQ - 2 Score 0    Advanced Directives Does patient have a HCPOA?    yes If yes, name and contact information: Cam, home number Does patient have a living will or MOST form?  yes  Objective:   Vitals: BP 123/70 mmHg  Pulse 74  Temp(Src) 97.6 F (36.4 C)  Ht '5\' 5"'  (1.651 m)  Wt 167 lb (75.751 kg)  BMI 27.79 kg/m2  SpO2 97% Body mass index is 27.79 kg/(m^2).  Visual Acuity Screening   Right eye Left eye Both eyes  Without correction: 20/30  20/30   With correction:       Physical Exam Mood/affect:  euthymic Appearance:  Neatly dressed, no distress  Cognitive Testing - 6-CIT  Correct? Score   What year is it? yes 0 Yes = 0    No = 4  What month is it? yes 0 Yes = 0    No = 3  Remember:  [place] [place], [place]     What time is it? yes 0 Yes = 0    No = 3  Count backwards from 20 to 1 yes 0 Correct = 0    1 error = 2   More than 1 error = 4  Say the months of the year in reverse. yes 0 Correct = 0    1 error = 2   More than 1 error = 4  What address did I ask you to remember? no 2 Correct = 0  1 error = 2    2 error = 4    3 error = 6    4 error = 8    All wrong = 10       TOTAL SCORE  2/28   Interpretation:  Normal  Normal (0-7) Abnormal (8-28)    Assessment & Plan:     Annual Wellness Visit  Reviewed patient's Family Medical History Reviewed and updated list of patient's medical providers Assessment of cognitive impairment was done Assessed patient's functional ability Established a written schedule for health screening [place] Completed and Reviewed  Exercise Activities and Dietary recommendations Goals    None    stay active and eat right  Immunization History  Administered Date(s) Administered  . Influenza-Unspecified 04/09/2015  . Pneumococcal Conjugate-13 03/02/2014  . Pneumococcal Polysaccharide-23 05/01/2005  . Td 08/07/2003  . Tdap 08/25/2013  . Zoster 05/16/2006    Health Maintenance  Topic Date Due  . INFLUENZA VACCINE  03/06/2016  . TETANUS/TDAP  08/26/2023  . DEXA SCAN  Addressed  . ZOSTAVAX  Completed  . PNA vac Low Risk Adult  Completed   Discussed health benefits of physical activity, and encouraged her to engage in regular exercise appropriate for her age and condition.   Meds ordered this encounter  Medications  . lovastatin (MEVACOR) 20 MG tablet    Sig: Take 20 mg by mouth every other day.     Current outpatient prescriptions:   .  aspirin 81 MG tablet, Take 81 mg by mouth daily., Disp: , Rfl:  .  Biotin 5000 MCG CAPS, Take 1 capsule by mouth daily., Disp: , Rfl:  .  Calcium Carbonate (CALCIUM 600 PO), Take 1 tablet by mouth every other day. , Disp: , Rfl:  .  Cholecalciferol (VITAMIN D PO), Take 1,000 Int'l Units by mouth daily., Disp: , Rfl:  .  Cyanocobalamin (VITAMIN B 12 PO), Take 500 mg by mouth daily., Disp: , Rfl:  .  folic acid (FOLVITE) 412 MCG tablet, Take 400 mcg by mouth daily., Disp: , Rfl:  .  letrozole (FEMARA) 2.5 MG tablet, Take 1 tablet (2.5 mg total) by mouth daily., Disp: 90 tablet, Rfl: 3 .  lovastatin (MEVACOR) 20 MG tablet, Take 20 mg by mouth every other day. , Disp: , Rfl:  .  Magnesium 250 MG TABS, Take 1 tablet by mouth daily., Disp: , Rfl:  .  metoprolol succinate (TOPROL-XL) 50 MG 24 hr tablet, Take 1 tablet (50 mg total) by mouth daily., Disp: 30 tablet, Rfl: 2 .  Multiple Vitamin (MULTIVITAMIN) tablet, Take 1 tablet by mouth daily., Disp: , Rfl:  .  Omega-3 Fatty Acids (FISH OIL) 1000 MG CAPS, Take 1 capsule by mouth every other day. , Disp: , Rfl:  .  Potassium Gluconate 595 MG CAPS, Take by mouth daily., Disp: , Rfl:  .  vitamin C (ASCORBIC ACID) 500 MG tablet, Take 500  mg by mouth every other day. , Disp: , Rfl:  Medications Discontinued During This Encounter  Medication Reason  . lovastatin (MEVACOR) 40 MG tablet Dose change   Next Medicare Wellness Visit in 12+ months  Glucose, A1c, lipids, sgpt -- patient will return  An after-visit summary was printed and given to the patient at [place].  Please see the patient instructions which may contain other information and recommendations beyond what is mentioned above in the assessment and plan.  Problem List Items Addressed This Visit      Cardiovascular and Mediastinum   Benign essential HTN    Controlled; continue same meds      Relevant Medications   lovastatin (MEVACOR) 20 MG tablet     Other   Breast cancer of  upper-inner quadrant of left female breast ([place])    She continues on Femara for breast cancer      Combined fat and carbohydrate induced hyperlipemia    Check lipids; monitor sgpt on the statin      Relevant Medications   lovastatin (MEVACOR) 20 MG tablet   Other Relevant Orders   Lipid Panel w/o Chol/HDL Ratio   Postmenopausal   Relevant Orders   DG Bone Density   Screening for osteoporosis    DEXA scan every 2 years; fall precautions, vit D, three servings of Ca2+ daily      Relevant Orders   DG Bone Density   Counseling regarding end of life decision making    Discussed with patient; she will take paperwork home and discuss with family      Preventative health care - Primary    USPSTF grade A and B recommendations reviewed with patient; age-appropriate recommendations, preventive care, screening tests, etc discussed and encouraged; healthy living encouraged; see AVS for patient education given to patient      Medication monitoring encounter    Check sgpt on the statin      Relevant Orders   Comprehensive metabolic panel   Hyperglycemia    Elevated glucose noted previously; recheck fasting glucose and A1c to r/o prediabetes or diabetes      Relevant Orders   Hgb A1c w/o eAG

## 2015-09-18 DIAGNOSIS — Z5181 Encounter for therapeutic drug level monitoring: Secondary | ICD-10-CM | POA: Insufficient documentation

## 2015-09-18 DIAGNOSIS — Z7189 Other specified counseling: Secondary | ICD-10-CM | POA: Insufficient documentation

## 2015-09-18 DIAGNOSIS — R739 Hyperglycemia, unspecified: Secondary | ICD-10-CM | POA: Insufficient documentation

## 2015-09-18 DIAGNOSIS — Z0000 Encounter for general adult medical examination without abnormal findings: Secondary | ICD-10-CM | POA: Insufficient documentation

## 2015-09-18 NOTE — Assessment & Plan Note (Signed)
Check sgpt on the statin 

## 2015-09-18 NOTE — Assessment & Plan Note (Signed)
Elevated glucose noted previously; recheck fasting glucose and A1c to r/o prediabetes or diabetes

## 2015-09-18 NOTE — Assessment & Plan Note (Signed)
DEXA scan every 2 years; fall precautions, vit D, three servings of Ca2+ daily

## 2015-09-18 NOTE — Assessment & Plan Note (Signed)
She continues on Femara for breast cancer

## 2015-09-18 NOTE — Assessment & Plan Note (Signed)
Discussed with patient; she will take paperwork home and discuss with family

## 2015-09-18 NOTE — Assessment & Plan Note (Signed)
USPSTF grade A and B recommendations reviewed with patient; age-appropriate recommendations, preventive care, screening tests, etc discussed and encouraged; healthy living encouraged; see AVS for patient education given to patient  

## 2015-09-18 NOTE — Assessment & Plan Note (Signed)
Check lipids; monitor sgpt on the statin

## 2015-09-18 NOTE — Assessment & Plan Note (Signed)
Controlled; continue same meds

## 2015-09-23 ENCOUNTER — Ambulatory Visit: Payer: Medicare Other | Admitting: Family Medicine

## 2015-09-29 ENCOUNTER — Other Ambulatory Visit: Payer: Medicare Other

## 2015-09-29 DIAGNOSIS — Z5181 Encounter for therapeutic drug level monitoring: Secondary | ICD-10-CM

## 2015-09-29 DIAGNOSIS — R739 Hyperglycemia, unspecified: Secondary | ICD-10-CM

## 2015-09-29 DIAGNOSIS — E782 Mixed hyperlipidemia: Secondary | ICD-10-CM

## 2015-09-30 ENCOUNTER — Encounter: Payer: Self-pay | Admitting: Family Medicine

## 2015-09-30 DIAGNOSIS — R739 Hyperglycemia, unspecified: Secondary | ICD-10-CM

## 2015-09-30 DIAGNOSIS — E786 Lipoprotein deficiency: Secondary | ICD-10-CM

## 2015-09-30 DIAGNOSIS — E781 Pure hyperglyceridemia: Secondary | ICD-10-CM | POA: Insufficient documentation

## 2015-09-30 LAB — COMPREHENSIVE METABOLIC PANEL
ALBUMIN: 4.1 g/dL (ref 3.5–4.8)
ALK PHOS: 57 IU/L (ref 39–117)
ALT: 17 IU/L (ref 0–32)
AST: 19 IU/L (ref 0–40)
Albumin/Globulin Ratio: 1.5 (ref 1.1–2.5)
BUN / CREAT RATIO: 17 (ref 11–26)
BUN: 14 mg/dL (ref 8–27)
Bilirubin Total: 0.6 mg/dL (ref 0.0–1.2)
CO2: 22 mmol/L (ref 18–29)
CREATININE: 0.82 mg/dL (ref 0.57–1.00)
Calcium: 9.2 mg/dL (ref 8.7–10.3)
Chloride: 103 mmol/L (ref 96–106)
GFR calc non Af Amer: 70 mL/min/{1.73_m2} (ref >59)
GFR, EST AFRICAN AMERICAN: 80 mL/min/{1.73_m2} (ref >59)
GLOBULIN, TOTAL: 2.7 g/dL (ref 1.5–4.5)
Glucose: 99 mg/dL (ref 65–99)
Potassium: 4.7 mmol/L (ref 3.5–5.2)
SODIUM: 141 mmol/L (ref 134–144)
TOTAL PROTEIN: 6.8 g/dL (ref 6.0–8.5)

## 2015-09-30 LAB — LIPID PANEL W/O CHOL/HDL RATIO
Cholesterol, Total: 162 mg/dL (ref 100–199)
HDL: 36 mg/dL — ABNORMAL LOW (ref >39)
LDL CALC: 76 mg/dL (ref 0–99)
Triglycerides: 250 mg/dL — ABNORMAL HIGH (ref 0–149)
VLDL Cholesterol Cal: 50 mg/dL — ABNORMAL HIGH (ref 5–40)

## 2015-09-30 LAB — HGB A1C W/O EAG: Hgb A1c MFr Bld: 5.4 % (ref 4.8–5.6)

## 2015-11-24 ENCOUNTER — Other Ambulatory Visit: Payer: Self-pay | Admitting: Family Medicine

## 2016-01-31 ENCOUNTER — Encounter: Payer: Self-pay | Admitting: *Deleted

## 2016-02-09 ENCOUNTER — Encounter: Payer: Self-pay | Admitting: General Surgery

## 2016-02-09 ENCOUNTER — Ambulatory Visit (INDEPENDENT_AMBULATORY_CARE_PROVIDER_SITE_OTHER): Payer: Medicare Other | Admitting: General Surgery

## 2016-02-09 VITALS — BP 130/66 | HR 80 | Resp 14 | Ht 65.0 in | Wt 171.0 lb

## 2016-02-09 DIAGNOSIS — R0789 Other chest pain: Secondary | ICD-10-CM

## 2016-02-09 NOTE — Patient Instructions (Signed)
The patient is aware to call back for any questions or concerns.  

## 2016-02-09 NOTE — Progress Notes (Signed)
Patient ID: Toni Callahan, female   DOB: 07/21/1941, 75 y.o.   MRN: [id number]  Chief Complaint  Patient presents with  . Follow-up    breast pain    HPI Toni Callahan is a 75 y.o. female.  Here today for evaluation of left breast pain. She has a known history of breast cancer. She states 5 weeks ago she almost fell and twisted to the right to keep form hitting a railing. This was 5 weeks ago. She is still having left breast/side has been aching, she did have some bruising on her right arm. She states it is much better now, and the pain resolved about two weeks ago. She states she wants reassurance today that she has not done any damage to herself.  I have reviewed the history of present illness with the patient.  HPI  Past Medical History  Diagnosis Date  . Allergy   . Personal history of tobacco use, presenting hazards to health   . Personal history of malignant neoplasm of breast 2009    left breast lumpectomy,chemotherapy and radiation therapy  . Special screening for malignant neoplasms, colon 2012  . Cancer ([facility]) 2009    left breast, T1, N0, M0. ER/PR positive, HER2 neu positive  . Breast cancer ([facility]) 2009  . Tachycardia   . Dental bridge present     permanant - upper    Past Surgical History  Procedure Laterality Date  . Portacath placement  2009  . Tonsillectomy    . Colonoscopy  [id number]    Dr. Bluford Kaufmann  . Port-a-cath removal  2011  . Cataract extraction w/ intraocular lens implant Bilateral   . Breast surgery Left 2009    lumpectomy  . Breast cyst aspiration Bilateral 2005  . Colonoscopy N/A 09/07/2015    Procedure: COLONOSCOPY;  Surgeon: Wallace Cullens, MD;  Location[facility];  Service: Gastroenterology;  Laterality: N/A;  . Polypectomy  09/07/2015    Procedure: POLYPECTOMY;  Surgeon: Wallace Cullens, MD;  Location: [facility];  Service: Gastroenterology;;    Family History  Problem Relation Age of Onset  . Stroke Mother   . Heart disease Father   .  Diabetes Brother   . Cancer Neg Hx   . COPD Neg Hx   . Hypertension Neg Hx     Social History Social History  Substance Use Topics  . Smoking status: Former Smoker -- 20 years    Types: Cigarettes    Quit date: 08/06/1984  . Smokeless tobacco: Never Used     Comment: quit in 1986 approximately  . Alcohol Use: Yes     Comment: drinks rarely    Allergies  Allergen Reactions  . Sulfa Antibiotics Nausea Only    Current Outpatient Prescriptions  Medication Sig Dispense Refill  . aspirin 81 MG tablet Take 81 mg by mouth daily.    . Biotin 5000 MCG CAPS Take 1 capsule by mouth daily.    . Calcium Carbonate (CALCIUM 600 PO) Take 1 tablet by mouth every other day.     . Cholecalciferol (VITAMIN D PO) Take 1,000 Int'l Units by mouth daily.    . Cyanocobalamin (VITAMIN B 12 PO) Take 500 mg by mouth daily.    . folic acid (FOLVITE) 400 MCG tablet Take 400 mcg by mouth daily.    Marland Kitchen letrozole (FEMARA) 2.5 MG tablet Take 1 tablet (2.5 mg total) by mouth daily. 90 tablet 3  . lovastatin (MEVACOR) 20 MG tablet 1 tab  every other day FOR CHOLESTEROL. 30 tablet 10  . Magnesium 250 MG TABS Take 1 tablet by mouth daily.    . metoprolol succinate (TOPROL-XL) 50 MG 24 hr tablet Take 1 tablet (50 mg total) by mouth daily. 30 tablet 2  . Multiple Vitamin (MULTIVITAMIN) tablet Take 1 tablet by mouth daily.    . Omega-3 Fatty Acids (FISH OIL) 1000 MG CAPS Take 1 capsule by mouth every other day.     . Potassium Gluconate 595 MG CAPS Take by mouth daily.    . vitamin C (ASCORBIC ACID) 500 MG tablet Take 500 mg by mouth every other day.      No current facility-administered medications for this visit.    Review of Systems Review of Systems  Constitutional: Negative.   Respiratory: Negative.   Cardiovascular: Negative.     Blood pressure 130/66, pulse 80, resp. rate 14, height 5\' 5"  (1.651 m), weight 171 lb (77.565 kg).  Physical Exam Physical Exam  Constitutional: She is oriented to person,  place, and time. She appears well-developed and well-nourished.  Eyes: Conjunctivae are normal. No scleral icterus.  Neck: Neck supple.  Cardiovascular: Normal rate, regular rhythm and normal heart sounds.   Pulmonary/Chest: Effort normal and breath sounds normal. Right breast exhibits no inverted nipple, no mass, no nipple discharge, no skin change and no tenderness. Left breast exhibits no inverted nipple, no mass, no nipple discharge, no skin change and no tenderness.  No bruising, no pain with palpation. No lumps felt, or deformity noted.   Lymphadenopathy:    She has no cervical adenopathy.  Neurological: She is alert and oriented to person, place, and time.  Skin: Skin is warm and dry.  Psychiatric: She has a normal mood and affect.    Data Reviewed None  Assessment    Left chest wall pain-resolved, no findings of concern in exam    Plan    Benign physical exam as of today. Nothing recommended for chest wall pain at this time. Call office with any concerns. Follow-up as scheduled in October.      PCP:  Baruch Gouty This information has been scribed by Dorathy Daft RN, BSN,BC.   Keyonia Gluth G 02/14/2016, 8:09 AM

## 2016-02-14 ENCOUNTER — Encounter: Payer: Self-pay | Admitting: General Surgery

## 2016-02-20 ENCOUNTER — Telehealth: Payer: Self-pay

## 2016-02-20 NOTE — Telephone Encounter (Signed)
Got call from call a nurse, pt states got stung by bug and now has blister was calling to check up on patient when husband states went to urgent care.

## 2016-03-11 ENCOUNTER — Other Ambulatory Visit: Payer: Self-pay | Admitting: Family Medicine

## 2016-04-16 ENCOUNTER — Other Ambulatory Visit: Payer: Self-pay | Admitting: General Surgery

## 2016-04-16 DIAGNOSIS — Z1231 Encounter for screening mammogram for malignant neoplasm of breast: Secondary | ICD-10-CM

## 2016-04-19 ENCOUNTER — Encounter: Payer: Self-pay | Admitting: *Deleted

## 2016-04-24 ENCOUNTER — Other Ambulatory Visit: Payer: Self-pay | Admitting: *Deleted

## 2016-04-24 DIAGNOSIS — C50919 Malignant neoplasm of unspecified site of unspecified female breast: Secondary | ICD-10-CM

## 2016-04-26 ENCOUNTER — Other Ambulatory Visit: Payer: Self-pay | Admitting: *Deleted

## 2016-04-26 ENCOUNTER — Encounter: Payer: Self-pay | Admitting: Hematology and Oncology

## 2016-04-26 ENCOUNTER — Inpatient Hospital Stay: Payer: Medicare Other | Attending: Hematology and Oncology

## 2016-04-26 ENCOUNTER — Ambulatory Visit: Payer: Medicare Other | Admitting: Oncology

## 2016-04-26 ENCOUNTER — Other Ambulatory Visit: Payer: Self-pay

## 2016-04-26 ENCOUNTER — Inpatient Hospital Stay (HOSPITAL_BASED_OUTPATIENT_CLINIC_OR_DEPARTMENT_OTHER): Payer: Medicare Other | Admitting: Hematology and Oncology

## 2016-04-26 VITALS — BP 116/71 | HR 71 | Temp 95.3°F | Resp 18 | Wt 170.6 lb

## 2016-04-26 DIAGNOSIS — Z170 Estrogen receptor positive status [ER+]: Secondary | ICD-10-CM | POA: Diagnosis not present

## 2016-04-26 DIAGNOSIS — Z87891 Personal history of nicotine dependence: Secondary | ICD-10-CM

## 2016-04-26 DIAGNOSIS — Z7982 Long term (current) use of aspirin: Secondary | ICD-10-CM | POA: Insufficient documentation

## 2016-04-26 DIAGNOSIS — Z79899 Other long term (current) drug therapy: Secondary | ICD-10-CM | POA: Insufficient documentation

## 2016-04-26 DIAGNOSIS — C50912 Malignant neoplasm of unspecified site of left female breast: Secondary | ICD-10-CM | POA: Insufficient documentation

## 2016-04-26 DIAGNOSIS — Z9221 Personal history of antineoplastic chemotherapy: Secondary | ICD-10-CM | POA: Diagnosis not present

## 2016-04-26 DIAGNOSIS — Z79811 Long term (current) use of aromatase inhibitors: Secondary | ICD-10-CM | POA: Insufficient documentation

## 2016-04-26 DIAGNOSIS — Z923 Personal history of irradiation: Secondary | ICD-10-CM | POA: Diagnosis not present

## 2016-04-26 DIAGNOSIS — C50212 Malignant neoplasm of upper-inner quadrant of left female breast: Secondary | ICD-10-CM

## 2016-04-26 DIAGNOSIS — C50919 Malignant neoplasm of unspecified site of unspecified female breast: Secondary | ICD-10-CM

## 2016-04-26 DIAGNOSIS — R000 Tachycardia, unspecified: Secondary | ICD-10-CM

## 2016-04-26 LAB — CBC WITH DIFFERENTIAL/PLATELET
Basophils Absolute: 0 10*3/uL (ref 0–0.1)
Basophils Relative: 0 %
Eosinophils Absolute: 0 10*3/uL (ref 0–0.7)
Eosinophils Relative: 0 %
HCT: 37.6 % (ref 35.0–47.0)
Hemoglobin: 13.1 g/dL (ref 12.0–16.0)
Lymphocytes Relative: 28 %
Lymphs Abs: 1.7 10*3/uL (ref 1.0–3.6)
MCH: 31.5 pg (ref 26.0–34.0)
MCHC: 34.9 g/dL (ref 32.0–36.0)
MCV: 90.4 fL (ref 80.0–100.0)
Monocytes Absolute: 0.4 10*3/uL (ref 0.2–0.9)
Monocytes Relative: 7 %
Neutro Abs: 4 10*3/uL (ref 1.4–6.5)
Neutrophils Relative %: 65 %
Platelets: 229 10*3/uL (ref 150–440)
RBC: 4.17 MIL/uL (ref 3.80–5.20)
RDW: 12 % (ref 11.5–14.5)
WBC: 6.2 10*3/uL (ref 3.6–11.0)

## 2016-04-26 LAB — COMPREHENSIVE METABOLIC PANEL
ALT: 17 U/L (ref 14–54)
AST: 20 U/L (ref 15–41)
Albumin: 4 g/dL (ref 3.5–5.0)
Alkaline Phosphatase: 50 U/L (ref 38–126)
Anion gap: 5 (ref 5–15)
BUN: 18 mg/dL (ref 6–20)
CO2: 25 mmol/L (ref 22–32)
Calcium: 8.9 mg/dL (ref 8.9–10.3)
Chloride: 106 mmol/L (ref 101–111)
Creatinine, Ser: 0.74 mg/dL (ref 0.44–1.00)
GFR calc Af Amer: >60 mL/min (ref >60)
GFR calc non Af Amer: >60 mL/min (ref >60)
Glucose, Bld: 126 mg/dL — ABNORMAL HIGH (ref 65–99)
Potassium: 4.1 mmol/L (ref 3.5–5.1)
Sodium: 136 mmol/L (ref 135–145)
Total Bilirubin: 0.6 mg/dL (ref 0.3–1.2)
Total Protein: 6.9 g/dL (ref 6.5–8.1)

## 2016-04-26 NOTE — Progress Notes (Signed)
[facility] day:  04/26/2016  Chief Complaint: Toni Callahan is a 75 y.o. female with stage I Her2/neu+ left breast cancer who is seen for reassessment.  HPI:  She was diagnosed with breast cancer in 2009.  She states that Dr. Jamal Collin diagnosed her with breast cysts then "found something".  She underwent lumpectomy and sentinel lymph node biopsy 02/19/2008 by Dr. Jamal Collin.  No pathology is available. Notes indicate pathologic stage TIcN0M0.  Tumor was ER positive, PR positive and HER-2/neu 3+.  She underwent chemotherapy consisting of Taxotere, carboplatin, and Herceptin ([place])  6 cycles (03/16/2008 - 06/29/2008). She then received Herceptin every 3 weeks (last cycle 02/15/2009).  She received radiation. She began letrozole (Femara).  She has continued daily letrozole.  She was off therapy for only about 2-3 months.  CA27.29 was 29.4 on 07/11/2010, 21.2 on 07/24/2011, 23.1 on 02/06/2012, 21.3 on 03/20/2013, 26.0 on 04/30/2014, 40.6 on 04/28/2015, and 32.6 on 06/21/2015.  Bilateral diagnostic mammogram on 05/27/2015 revealed stable lumpectomy site and no evidence of malignancy.  Bone density study on 05/19/2014 was normal with a T score of 0.3 in the left femur.   The patient was last seen in the medical oncology clinic by Dr. Oliva Bustard on 06/23/2015.  At that time, she was doing well.  CA27.29 was back to normal.  Discussion was held about BCI testing and extended adjuvant hormonal therapy.  BCI testing on 07/06/2015 revealed a high risk of late recurrence 8.1% (CI 3.3%-12.6%) at years 5-10 with a high likelihood of benefit.  Symptomatically, the patient denies any concerns. She feels well.  She is very active.   Past Medical History:  Diagnosis Date  . Allergy   . Breast cancer ([place]) 2009  . Cancer ([place]) 2009   left breast, T1, N0, M0. ER/PR positive, HER2 neu positive  . Dental bridge present    permanant - upper  . Personal history of  malignant neoplasm of breast 2009   left breast lumpectomy,chemotherapy and radiation therapy  . Personal history of tobacco use, presenting hazards to health   . Special screening for malignant neoplasms, colon 2012  . Tachycardia     Past Surgical History:  Procedure Laterality Date  . BREAST CYST ASPIRATION Bilateral 2005  . BREAST SURGERY Left 2009   lumpectomy  . CATARACT EXTRACTION W/ INTRAOCULAR LENS IMPLANT Bilateral   . COLONOSCOPY  [id number]   Dr. Candace Cruise  . COLONOSCOPY N/A 09/07/2015   Procedure: COLONOSCOPY;  Surgeon: Hulen Luster, MD;  Location: [facility];  Service: Gastroenterology;  Laterality: N/A;  . POLYPECTOMY  09/07/2015   Procedure: POLYPECTOMY;  Surgeon: Hulen Luster, MD;  Location: [facility];  Service: Gastroenterology;;  . PORT-A-CATH REMOVAL  2011  . PORTACATH PLACEMENT  2009  . TONSILLECTOMY      Family History  Problem Relation Age of Onset  . Stroke Mother   . Heart disease Father   . Diabetes Brother   . Cancer Neg Hx   . COPD Neg Hx   . Hypertension Neg Hx     Social History:  reports that she quit smoking about 31 years ago. Her smoking use included Cigarettes. She quit after 20.00 years of use. She has never used smokeless tobacco. She reports that she drinks alcohol. She reports that she does not use drugs.  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  The patient is alone today.  Allergies:  Allergies  Allergen Reactions  .  Sulfa Antibiotics Nausea Only    Current Medications: Current Outpatient Prescriptions  Medication Sig Dispense Refill  . aspirin 81 MG tablet Take 81 mg by mouth daily.    . Biotin 5000 MCG CAPS Take 1 capsule by mouth daily.    . Calcium Carbonate (CALCIUM 600 PO) Take 1 tablet by mouth every other day.     . Cholecalciferol (VITAMIN D PO) Take 1,000 Int'l Units by mouth daily.    . Cyanocobalamin (VITAMIN B 12 PO) Take 500 mg by mouth daily.    . folic acid (FOLVITE) 332 MCG tablet Take 400 mcg by mouth  daily.    Marland Kitchen letrozole (FEMARA) 2.5 MG tablet Take 1 tablet (2.5 mg total) by mouth daily. 90 tablet 3  . lovastatin (MEVACOR) 20 MG tablet 1 tab every other day FOR CHOLESTEROL. 30 tablet 10  . Magnesium 250 MG TABS Take 1 tablet by mouth daily.    . metoprolol succinate (TOPROL-XL) 50 MG 24 hr tablet Take 1 tablet (50 mg total) by mouth daily. 30 tablet 6  . Multiple Vitamin (MULTIVITAMIN) tablet Take 1 tablet by mouth daily.    . Omega-3 Fatty Acids (FISH OIL) 1000 MG CAPS Take 1 capsule by mouth every other day.     . Potassium Gluconate 595 MG CAPS Take by mouth daily.    . vitamin C (ASCORBIC ACID) 500 MG tablet Take 500 mg by mouth every other day.      No current facility-administered medications for this visit.     Review of Systems:  GENERAL:  Feels good.  Active.  No fevers, sweats or weight loss. PERFORMANCE STATUS (ECOG):  0 HEENT:  No visual changes, runny nose, sore throat, mouth sores or tenderness. Lungs: No shortness of breath or cough.  No hemoptysis. Cardiac:  No chest pain, palpitations, orthopnea, or PND. GI:  No nausea, vomiting, diarrhea, constipation, melena or hematochezia.  Colonoscopy 09/07/2015. GU:  No urgency, frequency, dysuria, or hematuria. Musculoskeletal:  No back pain.  No joint pain.  No muscle tenderness. Extremities:  No pain or swelling. Skin:  No rashes or skin changes. Neuro:  No headache, numbness or weakness, balance or coordination issues. Endocrine:  No diabetes, thyroid issues, hot flashes or night sweats. Psych:  No mood changes, depression or anxiety. Pain:  No focal pain. Review of systems:  All other systems reviewed and found to be negative.  Physical Exam: Blood pressure 116/71, pulse 71, temperature (!) 95.3 F (35.2 C), temperature source Tympanic, resp. rate 18, weight 170 lb 10.2 oz (77.4 kg). GENERAL:  Well developed, well nourished, womansitting comfortably in the exam room in no acute distress. MENTAL STATUS:  Alert and  oriented to person, place and time. HEAD:  Short gray styled hair.  Normocephalic, atraumatic, face symmetric, no Cushingoid features. EYES:  Glasses.  Blue eyes.  Pupils equal round and reactive to light and accomodation.  No conjunctivitis or scleral icterus. ENT:  Oropharynx clear without lesion.  Tongue normal. Mucous membranes moist.  RESPIRATORY:  Clear to auscultation without rales, wheezes or rhonchi. CARDIOVASCULAR:  Regular rate and rhythm without murmur, rub or gallop. BREAST:  Right breast with fibrocystic changes superiorly.  No discrete masses, skin changes or nipple discharge.  Left breast with well healed incision at 9 o'clock position.  No masses, skin changes or nipple discharge.  ABDOMEN:  Soft, non-tender, with active bowel sounds, and no hepatosplenomegaly.  No masses. SKIN:  No rashes, ulcers or lesions. EXTREMITIES: No edema, no skin  discoloration or tenderness.  No palpable cords. LYMPH NODES: No palpable cervical, supraclavicular, axillary or inguinal adenopathy  NEUROLOGICAL: Unremarkable. PSYCH:  Appropriate.  Orders Only on 04/26/2016  Component Date Value Ref Range Status  . WBC 04/26/2016 6.2  3.6 - 11.0 K/uL Final  . RBC 04/26/2016 4.17  3.80 - 5.20 MIL/uL Final  . Hemoglobin 04/26/2016 13.1  12.0 - 16.0 g/dL Final  . HCT 04/26/2016 37.6  35.0 - 47.0 % Final  . MCV 04/26/2016 90.4  80.0 - 100.0 fL Final  . MCH 04/26/2016 31.5  26.0 - 34.0 pg Final  . MCHC 04/26/2016 34.9  32.0 - 36.0 g/dL Final  . RDW 04/26/2016 12.0  11.5 - 14.5 % Final  . Platelets 04/26/2016 229  150 - 440 K/uL Final  . Neutrophils Relative % 04/26/2016 65  % Final  . Neutro Abs 04/26/2016 4.0  1.4 - 6.5 K/uL Final  . Lymphocytes Relative 04/26/2016 28  % Final  . Lymphs Abs 04/26/2016 1.7  1.0 - 3.6 K/uL Final  . Monocytes Relative 04/26/2016 7  % Final  . Monocytes Absolute 04/26/2016 0.4  0.2 - 0.9 K/uL Final  . Eosinophils Relative 04/26/2016 0  % Final  . Eosinophils Absolute  04/26/2016 0.0  0 - 0.7 K/uL Final  . Basophils Relative 04/26/2016 0  % Final  . Basophils Absolute 04/26/2016 0.0  0 - 0.1 K/uL Final  . Sodium 04/26/2016 136  135 - 145 mmol/L Final  . Potassium 04/26/2016 4.1  3.5 - 5.1 mmol/L Final  . Chloride 04/26/2016 106  101 - 111 mmol/L Final  . CO2 04/26/2016 25  22 - 32 mmol/L Final  . Glucose, Bld 04/26/2016 126* 65 - 99 mg/dL Final  . BUN 04/26/2016 18  6 - 20 mg/dL Final  . Creatinine, Ser 04/26/2016 0.74  0.44 - 1.00 mg/dL Final  . Calcium 04/26/2016 8.9  8.9 - 10.3 mg/dL Final  . Total Protein 04/26/2016 6.9  6.5 - 8.1 g/dL Final  . Albumin 04/26/2016 4.0  3.5 - 5.0 g/dL Final  . AST 04/26/2016 20  15 - 41 U/L Final  . ALT 04/26/2016 17  14 - 54 U/L Final  . Alkaline Phosphatase 04/26/2016 50  38 - 126 U/L Final  . Total Bilirubin 04/26/2016 0.6  0.3 - 1.2 mg/dL Final  . GFR calc non Af Amer 04/26/2016 >60  >60 mL/min Final  . GFR calc Af Amer 04/26/2016 >60  >60 mL/min Final   Comment: (NOTE) The eGFR has been calculated using the CKD EPI equation. This calculation has not been validated in all clinical situations. eGFR's persistently <60 mL/min signify possible Chronic Kidney Disease.   . Anion gap 04/26/2016 5  5 - 15 Final    Assessment:  Toni Callahan is a 75 y.o. female with stage I Her2/neu+ left breast cancer s/p lumpectomy and sentinel lymph node biopsy on  02/19/2008.  No pathology is available. Notes indicate pathologic stage TIcN0M0.  Tumor was ER positive, PR positive and HER-2/neu 3+.  BCI testing on 07/06/2015 revealed a high risk of late recurrence 8.1% (CI 3.3%-12.6%) at years 5-10 with a high likelihood of benefit of extended adjuvant hormonal therapy.  She received 6 cycles of Taxotere, carboplatin, and Herceptin ([place])  6 cycles (03/16/2008 - 06/29/2008). She received Herceptin every 3 weeks (last cycle 02/15/2009).  She received radiation. She began letrozole (Femara).  She was off therapy for only about 2-3  months.  CA27.29 was 29.4 on 07/11/2010, 21.2  on 07/24/2011, 23.1 on 02/06/2012, 21.3 on 03/20/2013, 26.0 on 04/30/2014, 40.6 on 04/28/2015, and 32.6 on 06/21/2015.  Bilateral diagnostic mammogram on 05/27/2015 revealed stable lumpectomy site and no evidence of malignancy (ordered by Dr. Jamal Collin).  Bone density study on 05/19/2014 was normal with a T score of 0.3 in the left femur.   Symptomatically, the patient denies any concerns. She feels well.  She is very active.  Plan: 1.  Review entire medical history, diagnosis and management of HER-2/neu breast cancer. Discuss her completion of a year of adjuvant Herceptin therapy. Discuss BCI results which show a clear benefit of continuation of adjuvant hormonal therapy for 10 years. All data was reviewed. Discussed the plan for bone density study every 2 years.  We discussed being active as well as calcium and vitamin D supplementation. 2.  Labs today:  CBC with diff, CMP, CA27.29. 3.  Continue Femara. 4.  Discuss calcium 1200 mg a day and vitamin D 800 IU a day. 5.  Schedule bone density study 05/21/2016. 6.  RTC in 6 months for MD assess, labs (CBC with diff, CMP, CA27.29), and review bone density.   Lequita Asal, MD  04/26/2016, 11:46 AM

## 2016-05-30 ENCOUNTER — Other Ambulatory Visit: Payer: Self-pay | Admitting: General Surgery

## 2016-05-30 ENCOUNTER — Ambulatory Visit
Admission: RE | Admit: 2016-05-30 | Discharge: 2016-05-30 | Disposition: A | Discharge status: Home / Self Care | Payer: Medicare Other | Source: Ambulatory Visit | Attending: Hematology and Oncology | Admitting: Hematology and Oncology

## 2016-05-30 ENCOUNTER — Ambulatory Visit
Admission: RE | Admit: 2016-05-30 | Discharge: 2016-05-30 | Disposition: A | Discharge status: Home / Self Care | Payer: Medicare Other | Source: Ambulatory Visit | Attending: General Surgery | Admitting: General Surgery

## 2016-05-30 DIAGNOSIS — C50212 Malignant neoplasm of upper-inner quadrant of left female breast: Secondary | ICD-10-CM | POA: Insufficient documentation

## 2016-05-30 DIAGNOSIS — Z87891 Personal history of nicotine dependence: Secondary | ICD-10-CM | POA: Insufficient documentation

## 2016-05-30 DIAGNOSIS — Z1231 Encounter for screening mammogram for malignant neoplasm of breast: Secondary | ICD-10-CM | POA: Insufficient documentation

## 2016-05-30 DIAGNOSIS — Z780 Asymptomatic menopausal state: Secondary | ICD-10-CM | POA: Diagnosis not present

## 2016-06-04 ENCOUNTER — Encounter: Payer: Self-pay | Admitting: *Deleted

## 2016-06-11 ENCOUNTER — Encounter: Payer: Self-pay | Admitting: General Surgery

## 2016-06-11 ENCOUNTER — Ambulatory Visit (INDEPENDENT_AMBULATORY_CARE_PROVIDER_SITE_OTHER): Payer: Medicare Other | Admitting: General Surgery

## 2016-06-11 VITALS — BP 126/76 | HR 68 | Resp 12 | Ht 65.0 in | Wt 170.0 lb

## 2016-06-11 DIAGNOSIS — C50212 Malignant neoplasm of upper-inner quadrant of left female breast: Secondary | ICD-10-CM

## 2016-06-11 NOTE — Progress Notes (Signed)
Patient ID: Toni Callahan, female   DOB: 1941-05-15, 75 y.o.   MRN: [id number]  Chief Complaint  Patient presents with  . Follow-up    mammogram    HPI Toni Callahan is a 75 y.o. female who presents for a breast cancer follow up. The most recent mammogram was done on 05/30/16 . She had colonoscopy in February of this year and 2 polyps removed- tubular adenomas. Patient does perform regular self breast checks and gets regular mammograms done.Patient had a bone density test done 10/25/ 17. Patient states no new breast issues.  I have reviewed the history of present illness with the patient.   HPI  Past Medical History:  Diagnosis Date  . Allergy   . Breast cancer ([facility]) 2009   left breast cancer, chem and radiation  . Cancer ([facility]) 2009   left breast, T1, N0, M0. ER/PR positive, HER2 neu positive  . Dental bridge present    permanant - upper  . Personal history of malignant neoplasm of breast 2009   left breast lumpectomy,chemotherapy and radiation therapy  . Personal history of tobacco use, presenting hazards to health   . Special screening for malignant neoplasms, colon 2012  . Tachycardia     Past Surgical History:  Procedure Laterality Date  . BREAST CYST ASPIRATION Bilateral 2005  . BREAST SURGERY Left 2009   lumpectomy  . CATARACT EXTRACTION W/ INTRAOCULAR LENS IMPLANT Bilateral   . COLONOSCOPY  [id number]   Dr. Bluford Kaufmann  . COLONOSCOPY N/A 09/07/2015   Procedure: COLONOSCOPY;  Surgeon: Wallace Cullens, MD;  Location[facility];  Service: Gastroenterology;  Laterality: N/A;  . POLYPECTOMY  09/07/2015   Procedure: POLYPECTOMY;  Surgeon: Wallace Cullens, MD;  Location: [facility];  Service: Gastroenterology;;  . PORT-A-CATH REMOVAL  2011  . PORTACATH PLACEMENT  2009  . TONSILLECTOMY      Family History  Problem Relation Age of Onset  . Stroke Mother   . Heart disease Father   . Diabetes Brother   . Cancer Neg Hx   . COPD Neg Hx   . Hypertension Neg Hx      Social History Social History  Substance Use Topics  . Smoking status: Former Smoker    Years: 20.00    Types: Cigarettes    Quit date: 08/06/1984  . Smokeless tobacco: Never Used     Comment: quit in 1986 approximately  . Alcohol use Yes     Comment: drinks rarely    Allergies  Allergen Reactions  . Sulfa Antibiotics Nausea Only    Current Outpatient Prescriptions  Medication Sig Dispense Refill  . aspirin 81 MG tablet Take 81 mg by mouth daily.    . Biotin 5000 MCG CAPS Take 1 capsule by mouth daily.    . Calcium Carbonate (CALCIUM 600 PO) Take 1 tablet by mouth every other day.     . Cholecalciferol (VITAMIN D PO) Take 1,000 Int'l Units by mouth daily.    . Cyanocobalamin (VITAMIN B 12 PO) Take 500 mg by mouth daily.    . folic acid (FOLVITE) 400 MCG tablet Take 400 mcg by mouth daily.    Marland Kitchen letrozole (FEMARA) 2.5 MG tablet Take 1 tablet (2.5 mg total) by mouth daily. 90 tablet 3  . lovastatin (MEVACOR) 20 MG tablet 1 tab every other day FOR CHOLESTEROL. 30 tablet 10  . Magnesium 250 MG TABS Take 1 tablet by mouth daily.    . metoprolol succinate (TOPROL-XL) 50 MG  24 hr tablet Take 1 tablet (50 mg total) by mouth daily. 30 tablet 6  . Multiple Vitamin (MULTIVITAMIN) tablet Take 1 tablet by mouth daily.    . Omega-3 Fatty Acids (FISH OIL) 1000 MG CAPS Take 1 capsule by mouth every other day.     . Potassium Gluconate 595 MG CAPS Take by mouth daily.    . vitamin C (ASCORBIC ACID) 500 MG tablet Take 500 mg by mouth every other day.      No current facility-administered medications for this visit.     Review of Systems Review of Systems  Constitutional: Negative.   Respiratory: Negative.   Cardiovascular: Negative.     Blood pressure 126/76, pulse 68, resp. rate 12, height 5\' 5"  (1.651 m), weight 170 lb (77.1 kg).  Physical Exam Physical Exam  Constitutional: She is oriented to person, place, and time. She appears well-developed and well-nourished.  Eyes:  Conjunctivae are normal. No scleral icterus.  Neck: Neck supple. No thyromegaly present.  Cardiovascular: Normal rate, regular rhythm and normal heart sounds.   Pulmonary/Chest: Effort normal and breath sounds normal. Right breast exhibits no inverted nipple, no mass, no nipple discharge, no skin change and no tenderness. Left breast exhibits no inverted nipple, no mass, no nipple discharge, no skin change and no tenderness.  Lymphadenopathy:    She has no cervical adenopathy.    She has no axillary adenopathy.  Neurological: She is alert and oriented to person, place, and time.  Skin: Skin is warm and dry.    Data Reviewed Mammogram and Bone density-stable  Assessment    History of left breast , 7yrs out. History of colon polyps. Stable exam    Plan     Follow up in one year with bilateral screening mammograms.  This information has been scribed by Milas Kocher, CMA         Toni Callahan 06/11/2016, 9:08 AM

## 2016-06-11 NOTE — Patient Instructions (Addendum)
Continue self breast exams. Call office for any new breast issues or concerns. Follow up in one year with bilateral screening mammograms.

## 2016-07-09 ENCOUNTER — Other Ambulatory Visit: Payer: Self-pay | Admitting: Oncology

## 2016-09-10 ENCOUNTER — Encounter: Payer: Self-pay | Admitting: Family Medicine

## 2016-09-10 ENCOUNTER — Ambulatory Visit (INDEPENDENT_AMBULATORY_CARE_PROVIDER_SITE_OTHER): Payer: Medicare Other | Admitting: Family Medicine

## 2016-09-10 VITALS — BP 124/62 | HR 87 | Temp 98.0°F | Resp 14 | Ht 65.0 in | Wt 167.9 lb

## 2016-09-10 DIAGNOSIS — I10 Essential (primary) hypertension: Secondary | ICD-10-CM | POA: Diagnosis not present

## 2016-09-10 DIAGNOSIS — Z5181 Encounter for therapeutic drug level monitoring: Secondary | ICD-10-CM

## 2016-09-10 DIAGNOSIS — L309 Dermatitis, unspecified: Secondary | ICD-10-CM | POA: Diagnosis not present

## 2016-09-10 DIAGNOSIS — Z0000 Encounter for general adult medical examination without abnormal findings: Secondary | ICD-10-CM | POA: Diagnosis not present

## 2016-09-10 DIAGNOSIS — R739 Hyperglycemia, unspecified: Secondary | ICD-10-CM

## 2016-09-10 DIAGNOSIS — E781 Pure hyperglyceridemia: Secondary | ICD-10-CM | POA: Insufficient documentation

## 2016-09-10 LAB — COMPLETE METABOLIC PANEL WITH GFR
ALT: 15 U/L (ref 6–29)
AST: 19 U/L (ref 10–35)
Albumin: 4 g/dL (ref 3.6–5.1)
Alkaline Phosphatase: 50 U/L (ref 33–130)
BUN: 17 mg/dL (ref 7–25)
CHLORIDE: 107 mmol/L (ref 98–110)
CO2: 26 mmol/L (ref 20–31)
Calcium: 9.3 mg/dL (ref 8.6–10.4)
Creat: 0.71 mg/dL (ref 0.60–0.93)
GFR, EST NON AFRICAN AMERICAN: 82 mL/min (ref >=60)
GLUCOSE: 102 mg/dL — AB (ref 65–99)
Potassium: 4.5 mmol/L (ref 3.5–5.3)
SODIUM: 141 mmol/L (ref 135–146)
Total Bilirubin: 0.3 mg/dL (ref 0.2–1.2)
Total Protein: 6.7 g/dL (ref 6.1–8.1)

## 2016-09-10 LAB — HEMOGLOBIN A1C
Hgb A1c MFr Bld: 5.2 % (ref <5.7)
Mean Plasma Glucose: 103 mg/dL

## 2016-09-10 LAB — LIPID PANEL
Cholesterol: 153 mg/dL (ref <200)
HDL: 28 mg/dL — AB (ref >50)
LDL CALC: 79 mg/dL (ref <100)
TRIGLYCERIDES: 231 mg/dL — AB (ref <150)
Total CHOL/HDL Ratio: 5.5 Ratio — ABNORMAL HIGH (ref <5.0)
VLDL: 46 mg/dL — AB (ref <30)

## 2016-09-10 MED ORDER — TRIAMCINOLONE ACETONIDE 0.1 % EX CREA: 1.0000 "application " | TOPICAL_CREAM | Freq: Two times a day (BID) | CUTANEOUS | 0 refills | Status: DC

## 2016-09-10 NOTE — Assessment & Plan Note (Signed)
Check labs, sgpt on statin

## 2016-09-10 NOTE — Assessment & Plan Note (Signed)
Check lipids 

## 2016-09-10 NOTE — Patient Instructions (Addendum)
Check at home or with your lawyer to see if you have a STOP sign or Do Not Resuscitate or Living Will or MOST form  Health Maintenance  Topic Date Due  . TETANUS/TDAP  08/26/2023  . INFLUENZA VACCINE  Completed  . DEXA SCAN  Addressed  . ZOSTAVAX  Completed  . PNA vac Low Risk Adult  Completed   Try the new cream; if not resolving in a few weeks, see your dermatologist because they may want to biopsy those lesions Let's get labs today If you have not heard anything from my staff in a week about any orders/referrals/studies from today, please contact us here to follow-up [phone]    Health Maintenance for Postmenopausal Women Introduction Menopause is a normal process in which your reproductive ability comes to an end. This process happens gradually over a span of months to years, usually between the ages of 5 and 5. Menopause is complete when you have missed 12 consecutive menstrual periods. It is important to talk with your health care provider about some of the most common conditions that affect postmenopausal women, such as heart disease, cancer, and bone loss (osteoporosis). Adopting a healthy lifestyle and getting preventive care can help to promote your health and wellness. Those actions can also lower your chances of developing some of these common conditions. What should I know about menopause? During menopause, you may experience a number of symptoms, such as:  Moderate-to-severe hot flashes.  Night sweats.  Decrease in sex drive.  Mood swings.  Headaches.  Tiredness.  Irritability.  Memory problems.  Insomnia. Choosing to treat or not to treat menopausal changes is an individual decision that you make with your health care provider. What should I know about hormone replacement therapy and supplements? Hormone therapy products are effective for treating symptoms that are associated with menopause, such as hot flashes and night sweats. Hormone replacement  carries certain risks, especially as you become older. If you are thinking about using estrogen or estrogen with progestin treatments, discuss the benefits and risks with your health care provider. What should I know about heart disease and stroke? Heart disease, heart attack, and stroke become more likely as you age. This may be due, in part, to the hormonal changes that your body experiences during menopause. These can affect how your body processes dietary fats, triglycerides, and cholesterol. Heart attack and stroke are both medical emergencies. There are many things that you can do to help prevent heart disease and stroke:  Have your blood pressure checked at least every 1-2 years. High blood pressure causes heart disease and increases the risk of stroke.  If you are 1-32 years old, ask your health care provider if you should take aspirin to prevent a heart attack or a stroke.  Do not use any tobacco products, including cigarettes, chewing tobacco, or electronic cigarettes. If you need help quitting, ask your health care provider.  It is important to eat a healthy diet and maintain a healthy weight.  Be sure to include plenty of vegetables, fruits, low-fat dairy products, and lean protein.  Avoid eating foods that are high in solid fats, added sugars, or salt (sodium).  Get regular exercise. This is one of the most important things that you can do for your health.  Try to exercise for at least 150 minutes each week. The type of exercise that you do should increase your heart rate and make you sweat. This is known as moderate-intensity exercise.  Try to do  strengthening exercises at least twice each week. Do these in addition to the moderate-intensity exercise.  Know your numbers.Ask your health care provider to check your cholesterol and your blood glucose. Continue to have your blood tested as directed by your health care provider. What should I know about cancer screening? There are  several types of cancer. Take the following steps to reduce your risk and to catch any cancer development as early as possible. Breast Cancer  Practice breast self-awareness.  This means understanding how your breasts normally appear and feel.  It also means doing regular breast self-exams. Let your health care provider know about any changes, no matter how small.  If you are 87 or older, have a clinician do a breast exam (clinical breast exam or CBE) every year. Depending on your age, family history, and medical history, it may be recommended that you also have a yearly breast X-ray (mammogram).  If you have a family history of breast cancer, talk with your health care provider about genetic screening.  If you are at high risk for breast cancer, talk with your health care provider about having an MRI and a mammogram every year.  Breast cancer (BRCA) gene test is recommended for women who have family members with BRCA-related cancers. Results of the assessment will determine the need for genetic counseling and BRCA1 and for BRCA2 testing. BRCA-related cancers include these types:  Breast. This occurs in males or females.  Ovarian.  Tubal. This may also be called fallopian tube cancer.  Cancer of the abdominal or pelvic lining (peritoneal cancer).  Prostate.  Pancreatic. Cervical, Uterine, and Ovarian Cancer  Your health care provider may recommend that you be screened regularly for cancer of the pelvic organs. These include your ovaries, uterus, and vagina. This screening involves a pelvic exam, which includes checking for microscopic changes to the surface of your cervix (Pap test).  For women ages 21-65, health care providers may recommend a pelvic exam and a Pap test every three years. For women ages 26-65, they may recommend the Pap test and pelvic exam, combined with testing for human papilloma virus (HPV), every five years. Some types of HPV increase your risk of cervical cancer.  Testing for HPV may also be done on women of any age who have unclear Pap test results.  Other health care providers may not recommend any screening for nonpregnant women who are considered low risk for pelvic cancer and have no symptoms. Ask your health care provider if a screening pelvic exam is right for you.  If you have had past treatment for cervical cancer or a condition that could lead to cancer, you need Pap tests and screening for cancer for at least 20 years after your treatment. If Pap tests have been discontinued for you, your risk factors (such as having a new sexual partner) need to be reassessed to determine if you should start having screenings again. Some women have medical problems that increase the chance of getting cervical cancer. In these cases, your health care provider may recommend that you have screening and Pap tests more often.  If you have a family history of uterine cancer or ovarian cancer, talk with your health care provider about genetic screening.  If you have vaginal bleeding after reaching menopause, tell your health care provider.  There are currently no reliable tests available to screen for ovarian cancer. Lung Cancer  Lung cancer screening is recommended for adults 63-63 years old who are at high risk for  lung cancer because of a history of smoking. A yearly low-dose CT scan of the lungs is recommended if you:  Currently smoke.  Have a history of at least 30 pack-years of smoking and you currently smoke or have quit within the past 15 years. A pack-year is smoking an average of one pack of cigarettes per day for one year. Yearly screening should:  Continue until it has been 15 years since you quit.  Stop if you develop a health problem that would prevent you from having lung cancer treatment. Colorectal Cancer  This type of cancer can be detected and can often be prevented.  Routine colorectal cancer screening usually begins at age 86 and continues  through age 64.  If you have risk factors for colon cancer, your health care provider may recommend that you be screened at an earlier age.  If you have a family history of colorectal cancer, talk with your health care provider about genetic screening.  Your health care provider may also recommend using home test kits to check for hidden blood in your stool.  A small camera at the end of a tube can be used to examine your colon directly (sigmoidoscopy or colonoscopy). This is done to check for the earliest forms of colorectal cancer.  Direct examination of the colon should be repeated every 5-10 years until age 22. However, if early forms of precancerous polyps or small growths are found or if you have a family history or genetic risk for colorectal cancer, you may need to be screened more often. Skin Cancer  Check your skin from head to toe regularly.  Monitor any moles. Be sure to tell your health care provider:  About any new moles or changes in moles, especially if there is a change in a mole's shape or color.  If you have a mole that is larger than the size of a pencil eraser.  If any of your family members has a history of skin cancer, especially at a young age, talk with your health care provider about genetic screening.  Always use sunscreen. Apply sunscreen liberally and repeatedly throughout the day.  Whenever you are outside, protect yourself by wearing long sleeves, pants, a wide-brimmed hat, and sunglasses. What should I know about osteoporosis? Osteoporosis is a condition in which bone destruction happens more quickly than new bone creation. After menopause, you may be at an increased risk for osteoporosis. To help prevent osteoporosis or the bone fractures that can happen because of osteoporosis, the following is recommended:  If you are 17-59 years old, get at least 1,000 mg of calcium and at least 600 mg of vitamin D per day.  If you are older than age 68 but younger  than age 42, get at least 1,200 mg of calcium and at least 600 mg of vitamin D per day.  If you are older than age 67, get at least 1,200 mg of calcium and at least 800 mg of vitamin D per day. Smoking and excessive alcohol intake increase the risk of osteoporosis. Eat foods that are rich in calcium and vitamin D, and do weight-bearing exercises several times each week as directed by your health care provider. What should I know about how menopause affects my mental health? Depression may occur at any age, but it is more common as you become older. Common symptoms of depression include:  Low or sad mood.  Changes in sleep patterns.  Changes in appetite or eating patterns.  Feeling an overall  lack of motivation or enjoyment of activities that you previously enjoyed.  Frequent crying spells. Talk with your health care provider if you think that you are experiencing depression. What should I know about immunizations? It is important that you get and maintain your immunizations. These include:  Tetanus, diphtheria, and pertussis (Tdap) booster vaccine.  Influenza every year before the flu season begins.  Pneumonia vaccine.  Shingles vaccine. Your health care provider may also recommend other immunizations. This information is not intended to replace advice given to you by your health care provider. Make sure you discuss any questions you have with your health care provider. Document Released: 09/14/2005 Document Revised: 02/10/2016 Document Reviewed: 04/26/2015  2017 [employer]vier

## 2016-09-10 NOTE — Progress Notes (Signed)
Patient: Toni Callahan, Female    DOB: [birth year], 76 y.o.   MRN: [id number]  Visit Date: 09/10/2016  Today's Provider: Enid Derry, MD   Chief Complaint  Patient presents with  . Annual Exam    [facility]    Subjective:   Toni Callahan is a 76 y.o. female who presents today for her Subsequent Annual Wellness Visit.  Caregiver input:  N/a  USPSTF grade A and B recommendations Alcohol: at least 7 glasses of wine Depression:  Depression screen [facility] 2/9 09/10/2016 09/16/2015  Decreased Interest 0 0  Down, Depressed, Hopeless 0 0  PHQ - 2 Score 0 0   Hypertension: controlled  Obesity: no Tobacco use: quit 25 years ago HIV, hep B, hep C: declined STD testing and prevention (chl/gon/syphilis): declined Lipids: last TG 250, last HDL 36 February 2017 Glucose: last 99 in Feb 2017 Colorectal cancer: Feb 2017, Dr. Candace Cruise; he told her she did not need to come back b/c of age Breast cancer: October 2017; next in one year Intimate partner violence: no abuse Cervical cancer screening: graduated Lung cancer: quit smoking over 25 years ago Osteoporosis: done in October 2017; normal Fall prevention/vitamin D: taking 1,000 iu AAA: no fam hx Aspirin: taking 81 mg daily Diet: pretty good diet Exercise: regular exercise Skin cancer: no worrisome moles; goes to dermatologist regularly  She has hx of elevated glucose on and off; brother has diabetes High cholesterol; last TG reviewed  HPI  Review of Systems  Skin: Positive for rash (on both legs; has tried OTC steroid; sees derm but hasn't yet for this; going on for months).   Past Medical History:  Diagnosis Date  . Allergy   . Breast cancer ([facility]) 2009   left breast cancer, chem and radiation  . Cancer ([place]) 2009   left breast, T1, N0, M0. ER/PR positive, HER2 neu positive  . Dental bridge present    permanant - upper  . Personal history of malignant neoplasm of breast 2009   left breast lumpectomy,chemotherapy and  radiation therapy  . Personal history of tobacco use, presenting hazards to health   . Special screening for malignant neoplasms, colon 2012  . Tachycardia     Past Surgical History:  Procedure Laterality Date  . BREAST CYST ASPIRATION Bilateral 2005  . BREAST SURGERY Left 2009   lumpectomy  . CATARACT EXTRACTION W/ INTRAOCULAR LENS IMPLANT Bilateral   . COLONOSCOPY  [id number]   Dr. Candace Cruise  . COLONOSCOPY N/A 09/07/2015   Procedure: COLONOSCOPY;  Surgeon: Hulen Luster, MD;  Location: [facility];  Service: Gastroenterology;  Laterality: N/A;  . POLYPECTOMY  09/07/2015   Procedure: POLYPECTOMY;  Surgeon: Hulen Luster, MD;  Location: [facility];  Service: Gastroenterology;;  . PORT-A-CATH REMOVAL  2011  . PORTACATH PLACEMENT  2009  . TONSILLECTOMY      Family History  Problem Relation Age of Onset  . Stroke Mother   . Heart disease Father   . Diabetes Brother   . Cancer Neg Hx   . COPD Neg Hx   . Hypertension Neg Hx     Social History   Social History  . Marital status: Married    Spouse name: N/A  . Number of children: N/A  . Years of education: N/A   Occupational History  . Not on file.   Social History Main Topics  . Smoking status: Former Smoker    Years: 20.00    Types: Cigarettes    Quit  date: 08/06/1984  . Smokeless tobacco: Never Used     Comment: quit in 1986 approximately  . Alcohol use Yes     Comment: drinks rarely  . Drug use: No  . Sexual activity: Yes   Other Topics Concern  . Not on file   Social History Narrative  . No narrative on file    Outpatient Encounter Prescriptions as of 09/10/2016  Medication Sig  . Vitamin D, Ergocalciferol, (DRISDOL) 50000 units CAPS capsule Take 50,000 Units by mouth every 7 (seven) days.  Marland Kitchen aspirin 81 MG tablet Take 81 mg by mouth daily.  . Biotin 5000 MCG CAPS Take 1 capsule by mouth daily.  . Calcium Carbonate (CALCIUM 600 PO) Take 1 tablet by mouth every other day.   . Cholecalciferol (VITAMIN D PO)  Take 1,000 Int'l Units by mouth daily.  . Cyanocobalamin (VITAMIN B 12 PO) Take 500 mg by mouth daily.  . folic acid (FOLVITE) 419 MCG tablet Take 400 mcg by mouth daily.  Marland Kitchen letrozole (FEMARA) 2.5 MG tablet Take 1 tablet (2.5 mg total) by mouth daily.  Marland Kitchen lovastatin (MEVACOR) 20 MG tablet 1 tab every other day FOR CHOLESTEROL.  . Magnesium 250 MG TABS Take 1 tablet by mouth daily.  . metoprolol succinate (TOPROL-XL) 50 MG 24 hr tablet Take 1 tablet (50 mg total) by mouth daily.  . Multiple Vitamin (MULTIVITAMIN) tablet Take 1 tablet by mouth daily.  . Omega-3 Fatty Acids (FISH OIL) 1000 MG CAPS Take 1 capsule by mouth every other day.   . Potassium Gluconate 595 MG CAPS Take by mouth daily.  . vitamin C (ASCORBIC ACID) 500 MG tablet Take 500 mg by mouth every other day.    No facility-administered encounter medications on file as of 09/10/2016.     Functional Ability / Safety Screening 1.  Was the timed Get Up and Go test longer than 30 seconds?  no 2.  Does the patient need help with the phone, transportation, shopping,      preparing meals, housework, laundry, medications, or managing money?  no 3.  Does the patient's home have:  loose throw rugs in the hallway?   no      Grab bars in the bathroom? yes      Handrails on the stairs?   yes      Poor lighting?   no 4.  Has the patient noticed any hearing difficulties?   no  Fall Risk Assessment See under rooming  Depression Screen See under rooming Depression screen [facility] 2/9 09/10/2016 09/16/2015  Decreased Interest 0 0  Down, Depressed, Hopeless 0 0  PHQ - 2 Score 0 0    Advanced Directives Does patient have a HCPOA?    yes If yes, name and contact information: husband, same home number Does patient have a living will or MOST form?  no, not sure, she'll chck on those   Objective:   Vitals: BP 124/62   Pulse 87   Temp 98 F (36.7 C) (Oral)   Resp 14   Ht '5\' 5"'  (1.651 m)   Wt 167 lb 14.4 oz (76.2 kg)   SpO2 97%   BMI 27.94  kg/m  Body mass index is 27.94 kg/m. No exam data present  Physical Exam  Constitutional: She appears well-developed and well-nourished.  Cardiovascular: Normal rate.   Pulmonary/Chest: Effort normal.  Musculoskeletal: She exhibits no edema.  Skin: Rash (both anterior lower legs; erythematous palpable rash, irregular; no scale; some induration; no dermatomal distribution;  no vesicles) noted.  Psychiatric: She has a normal mood and affect. Her behavior is normal. Judgment and thought content normal.   Mood/affect:  Euthymic, pleasant Appearance:  Neatly groomed, good hygiene  Cognitive Testing - 6-CIT  Correct? Score   What year is it? yes 0 Yes = 0    No = 4  What month is it? yes 0 Yes = 0    No = 3  Remember:     [address]ia [address], [place], [place]     What time is it? yes 0 Yes = 0    No = 3  Count backwards from 20 to 1 yes 0 Correct = 0    1 error = 2   More than 1 error = 4  Say the months of the year in reverse. yes 0 Correct = 0    1 error = 2   More than 1 error = 4  What address did I ask you to remember? yes 0 Correct = 0  1 error = 2    2 error = 4    3 error = 6    4 error = 8    All wrong = 10       TOTAL SCORE  0/28   Interpretation:  Normal  Normal (0-7) Abnormal (8-28)    Assessment & Plan:     Annual Wellness Visit  Reviewed patient's Family Medical History Reviewed and updated list of patient's medical providers Assessment of cognitive impairment was done Assessed patient's functional ability Established a written schedule for health screening services Health Risk Assessent Completed and Reviewed  Exercise Activities and Dietary recommendations Goals    None    cut back on wine; discussed smart/safe limit of 7 drinks per week; she'll try  Immunization History  Administered Date(s) Administered  . Influenza-Unspecified 04/09/2015, 04/11/2016  . Pneumococcal Conjugate-13 03/02/2014  . Pneumococcal Polysaccharide-23 05/01/2005  . Td 08/07/2003   . Tdap 08/25/2013  . Zoster 05/16/2006    Health Maintenance  Topic Date Due  . Samul Dada  08/26/2023  . INFLUENZA VACCINE  Completed  . DEXA SCAN  Addressed  . ZOSTAVAX  Completed  . PNA vac Low Risk Adult  Completed   Discussed health benefits of physical activity, and encouraged her to engage in regular exercise appropriate for her age and condition.   Meds ordered this encounter  Medications  . Vitamin D, Ergocalciferol, (DRISDOL) 50000 units CAPS capsule    Sig: Take 50,000 Units by mouth every 7 (seven) days.    Current Outpatient Prescriptions:  Marland Kitchen  Vitamin D, Ergocalciferol, (DRISDOL) 50000 units CAPS capsule, Take 50,000 Units by mouth every 7 (seven) days., Disp: , Rfl:  .  aspirin 81 MG tablet, Take 81 mg by mouth daily., Disp: , Rfl:  .  Biotin 5000 MCG CAPS, Take 1 capsule by mouth daily., Disp: , Rfl:  .  Calcium Carbonate (CALCIUM 600 PO), Take 1 tablet by mouth every other day. , Disp: , Rfl:  .  Cholecalciferol (VITAMIN D PO), Take 1,000 Int'l Units by mouth daily., Disp: , Rfl:  .  Cyanocobalamin (VITAMIN B 12 PO), Take 500 mg by mouth daily., Disp: , Rfl:  .  folic acid (FOLVITE) 604 MCG tablet, Take 400 mcg by mouth daily., Disp: , Rfl:  .  letrozole (FEMARA) 2.5 MG tablet, Take 1 tablet (2.5 mg total) by mouth daily., Disp: 90 tablet, Rfl: 0 .  lovastatin (MEVACOR) 20 MG tablet, 1 tab  every other day FOR CHOLESTEROL., Disp: 30 tablet, Rfl: 10 .  Magnesium 250 MG TABS, Take 1 tablet by mouth daily., Disp: , Rfl:  .  metoprolol succinate (TOPROL-XL) 50 MG 24 hr tablet, Take 1 tablet (50 mg total) by mouth daily., Disp: 30 tablet, Rfl: 6 .  Multiple Vitamin (MULTIVITAMIN) tablet, Take 1 tablet by mouth daily., Disp: , Rfl:  .  Omega-3 Fatty Acids (FISH OIL) 1000 MG CAPS, Take 1 capsule by mouth every other day. , Disp: , Rfl:  .  Potassium Gluconate 595 MG CAPS, Take by mouth daily., Disp: , Rfl:  .  vitamin C (ASCORBIC ACID) 500 MG tablet, Take 500 mg by  mouth every other day. , Disp: , Rfl:  There are no discontinued medications.  Next Medicare Wellness Visit in 12+ months  Problem List Items Addressed This Visit      Cardiovascular and Mediastinum   Benign essential HTN    Controlled        Other   Preventative health care - Primary    USPSTF grade A and B recommendations reviewed with patient; age-appropriate recommendations, preventive care, screening tests, etc discussed and encouraged; healthy living encouraged; see AVS for patient education given to patient       Medication monitoring encounter    Check labs, sgpt on statin      Relevant Orders   COMPLETE METABOLIC PANEL WITH GFR   Hypertriglyceridemia    Check lipids      Relevant Orders   Lipid panel   Hyperglycemia    Check A1c and glucose      Relevant Orders   Hemoglobin A1c    Other Visit Diagnoses    Dermatitis       will use stronger corticosteroid; if not resolving, see dermatologist for biopsy

## 2016-09-10 NOTE — Assessment & Plan Note (Signed)
USPSTF grade A and B recommendations reviewed with patient; age-appropriate recommendations, preventive care, screening tests, etc discussed and encouraged; healthy living encouraged; see AVS for patient education given to patient  

## 2016-09-10 NOTE — Assessment & Plan Note (Signed)
Check A1c and glucose 

## 2016-09-10 NOTE — Assessment & Plan Note (Signed)
Controlled.  

## 2016-09-18 ENCOUNTER — Other Ambulatory Visit: Payer: Self-pay | Admitting: Family Medicine

## 2016-09-18 MED ORDER — LOVASTATIN 20 MG PO TABS: ORAL_TABLET | ORAL | 11 refills | Status: DC

## 2016-09-18 NOTE — Progress Notes (Signed)
rx sent to pharmacy

## 2016-09-21 ENCOUNTER — Other Ambulatory Visit: Payer: Self-pay | Admitting: Family Medicine

## 2016-09-21 ENCOUNTER — Telehealth: Payer: Self-pay | Admitting: Family Medicine

## 2016-09-21 MED ORDER — TRIAMCINOLONE ACETONIDE 0.1 % EX CREA: 1.0000 "application " | TOPICAL_CREAM | Freq: Two times a day (BID) | CUTANEOUS | 1 refills | Status: DC

## 2016-09-21 NOTE — Telephone Encounter (Signed)
Yes, Rx sent 

## 2016-09-21 NOTE — Telephone Encounter (Signed)
Patient would like to know would it be ok to get a refill of her kenalog cream. She stated the break outs are clearing up but she notice that it pops up form time to time.

## 2016-09-21 NOTE — Telephone Encounter (Signed)
Already done

## 2016-09-21 NOTE — Telephone Encounter (Signed)
PT RETURNING YOUR CALL. PLEASE CALL HER BACK.

## 2016-10-08 ENCOUNTER — Other Ambulatory Visit: Payer: Self-pay | Admitting: *Deleted

## 2016-10-08 MED ORDER — LETROZOLE 2.5 MG PO TABS: 2.5000 mg | ORAL_TABLET | Freq: Every day | ORAL | 1 refills | Status: DC

## 2016-10-29 ENCOUNTER — Telehealth: Payer: Self-pay | Admitting: Family Medicine

## 2016-10-29 ENCOUNTER — Inpatient Hospital Stay (HOSPITAL_BASED_OUTPATIENT_CLINIC_OR_DEPARTMENT_OTHER): Payer: Medicare Other | Admitting: Hematology and Oncology

## 2016-10-29 ENCOUNTER — Inpatient Hospital Stay: Payer: Medicare Other | Attending: Hematology and Oncology

## 2016-10-29 VITALS — BP 135/85 | HR 71 | Temp 97.0°F | Resp 18 | Wt 165.1 lb

## 2016-10-29 DIAGNOSIS — Z9221 Personal history of antineoplastic chemotherapy: Secondary | ICD-10-CM

## 2016-10-29 DIAGNOSIS — Z79899 Other long term (current) drug therapy: Secondary | ICD-10-CM | POA: Diagnosis not present

## 2016-10-29 DIAGNOSIS — C50212 Malignant neoplasm of upper-inner quadrant of left female breast: Secondary | ICD-10-CM

## 2016-10-29 DIAGNOSIS — Z7982 Long term (current) use of aspirin: Secondary | ICD-10-CM

## 2016-10-29 DIAGNOSIS — C50912 Malignant neoplasm of unspecified site of left female breast: Secondary | ICD-10-CM

## 2016-10-29 DIAGNOSIS — Z79811 Long term (current) use of aromatase inhibitors: Secondary | ICD-10-CM

## 2016-10-29 DIAGNOSIS — Z923 Personal history of irradiation: Secondary | ICD-10-CM

## 2016-10-29 DIAGNOSIS — Z87891 Personal history of nicotine dependence: Secondary | ICD-10-CM | POA: Insufficient documentation

## 2016-10-29 DIAGNOSIS — Z170 Estrogen receptor positive status [ER+]: Secondary | ICD-10-CM | POA: Insufficient documentation

## 2016-10-29 LAB — CBC WITH DIFFERENTIAL/PLATELET
Basophils Absolute: 0 10*3/uL (ref 0–0.1)
Basophils Relative: 1 %
Eosinophils Absolute: 0 10*3/uL (ref 0–0.7)
Eosinophils Relative: 0 %
HCT: 39.2 % (ref 35.0–47.0)
Hemoglobin: 13.6 g/dL (ref 12.0–16.0)
Lymphocytes Relative: 30 %
Lymphs Abs: 2 10*3/uL (ref 1.0–3.6)
MCH: 31.3 pg (ref 26.0–34.0)
MCHC: 34.7 g/dL (ref 32.0–36.0)
MCV: 90.2 fL (ref 80.0–100.0)
Monocytes Absolute: 0.4 10*3/uL (ref 0.2–0.9)
Monocytes Relative: 6 %
Neutro Abs: 4.2 10*3/uL (ref 1.4–6.5)
Neutrophils Relative %: 63 %
Platelets: 253 10*3/uL (ref 150–440)
RBC: 4.34 MIL/uL (ref 3.80–5.20)
RDW: 12.7 % (ref 11.5–14.5)
WBC: 6.7 10*3/uL (ref 3.6–11.0)

## 2016-10-29 LAB — COMPREHENSIVE METABOLIC PANEL
ALT: 19 U/L (ref 14–54)
AST: 23 U/L (ref 15–41)
Albumin: 3.8 g/dL (ref 3.5–5.0)
Alkaline Phosphatase: 50 U/L (ref 38–126)
Anion gap: 5 (ref 5–15)
BUN: 17 mg/dL (ref 6–20)
CO2: 26 mmol/L (ref 22–32)
Calcium: 9 mg/dL (ref 8.9–10.3)
Chloride: 107 mmol/L (ref 101–111)
Creatinine, Ser: 0.81 mg/dL (ref 0.44–1.00)
GFR calc Af Amer: >60 mL/min (ref >60)
GFR calc non Af Amer: >60 mL/min (ref >60)
Glucose, Bld: 100 mg/dL — ABNORMAL HIGH (ref 65–99)
Potassium: 4.3 mmol/L (ref 3.5–5.1)
Sodium: 138 mmol/L (ref 135–145)
Total Bilirubin: 0.7 mg/dL (ref 0.3–1.2)
Total Protein: 7.3 g/dL (ref 6.5–8.1)

## 2016-10-29 NOTE — Progress Notes (Signed)
Patient here today for follow up regarding breast cancer . 

## 2016-10-29 NOTE — Telephone Encounter (Signed)
Requesting return call to discuss her cholesterol medication [phone]

## 2016-10-29 NOTE — Progress Notes (Signed)
[facility] day:  10/29/2016  Chief Complaint: Toni Callahan is a 76 y.o. female with stage I Her2/neu+ left breast cancer who is seen for 6 month assessment.  HPI:  The patient was last seen in the medical oncology clinic on 04/26/2016.  At that time, she was seen for initial assessment by me.  We discussed her BCI results which showed a clear benefit of continuation of adjuvant hormonal therapy for 10 years.  We discussed the plan for bone density study every 2 years.  We discussed being active as well as calcium and vitamin D supplementation.  Bilateral screening mammogram on 05/30/2016 revealed no evidence of malignancy.  Bone density study on 05/30/2016 was normal with a T-score of -0.1 in the AP spine (L1-L2).  Symptomatically, she has done well.  She voices no concerns.   Past Medical History:  Diagnosis Date  . Allergy   . Breast cancer ([place]) 2009   left breast cancer, chem and radiation  . Cancer ([place]) 2009   left breast, T1, N0, M0. ER/PR positive, HER2 neu positive  . Dental bridge present    permanant - upper  . Personal history of malignant neoplasm of breast 2009   left breast lumpectomy,chemotherapy and radiation therapy  . Personal history of tobacco use, presenting hazards to health   . Special screening for malignant neoplasms, colon 2012  . Tachycardia     Past Surgical History:  Procedure Laterality Date  . BREAST CYST ASPIRATION Bilateral 2005  . BREAST SURGERY Left 2009   lumpectomy  . CATARACT EXTRACTION W/ INTRAOCULAR LENS IMPLANT Bilateral   . COLONOSCOPY  [id number]   Dr. Candace Cruise  . COLONOSCOPY N/A 09/07/2015   Procedure: COLONOSCOPY;  Surgeon: Hulen Luster, MD;  Location: [facility];  Service: Gastroenterology;  Laterality: N/A;  . POLYPECTOMY  09/07/2015   Procedure: POLYPECTOMY;  Surgeon: Hulen Luster, MD;  Location: [facility];  Service: Gastroenterology;;  . PORT-A-CATH REMOVAL  2011  .  PORTACATH PLACEMENT  2009  . TONSILLECTOMY      Family History  Problem Relation Age of Onset  . Stroke Mother   . Heart disease Father   . Diabetes Brother   . Cancer Neg Hx   . COPD Neg Hx   . Hypertension Neg Hx     Social History:  reports that she quit smoking about 32 years ago. Her smoking use included Cigarettes. She quit after 20.00 years of use. She has never used smokeless tobacco. She reports that she drinks alcohol. She reports that she does not use drugs.  She has lived in the area 59 years.  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  The patient is alone today.  Allergies:  Allergies  Allergen Reactions  . Sulfa Antibiotics Nausea Only    Current Medications: Current Outpatient Prescriptions  Medication Sig Dispense Refill  . aspirin 81 MG tablet Take 81 mg by mouth daily.    . Biotin 5000 MCG CAPS Take 1 capsule by mouth daily.    . Calcium Carbonate (CALCIUM 600 PO) Take 1 tablet by mouth every other day.     . Cholecalciferol (VITAMIN D PO) Take 1,000 Int'l Units by mouth daily.    . Cyanocobalamin (VITAMIN B 12 PO) Take 500 mg by mouth daily.    . folic acid (FOLVITE) 540 MCG tablet Take 400 mcg by mouth daily.    Marland Kitchen letrozole (FEMARA) 2.5 MG tablet  Take 1 tablet (2.5 mg total) by mouth daily. 90 tablet 1  . lovastatin (MEVACOR) 20 MG tablet One by mouth every other night for cholesterol 15 tablet 11  . Magnesium 250 MG TABS Take 1 tablet by mouth daily.    . metoprolol succinate (TOPROL-XL) 50 MG 24 hr tablet Take 1 tablet (50 mg total) by mouth daily. 30 tablet 6  . Multiple Vitamin (MULTIVITAMIN) tablet Take 1 tablet by mouth daily.    . Omega-3 Fatty Acids (FISH OIL) 1000 MG CAPS Take 1 capsule by mouth every other day.     . Potassium Gluconate 595 MG CAPS Take by mouth daily.    Marland Kitchen triamcinolone cream (KENALOG) 0.1 % Apply 1 application topically 2 (two) times daily. 30 g 1  . Vitamin D, Ergocalciferol, (DRISDOL) 50000 units CAPS capsule Take 50,000  Units by mouth every 7 (seven) days.    . vitamin C (ASCORBIC ACID) 500 MG tablet Take 500 mg by mouth every other day.      No current facility-administered medications for this visit.     Review of Systems:  GENERAL:  Feels good.  Active.  No fevers, sweats or weight loss. PERFORMANCE STATUS (ECOG):  0 HEENT:  No visual changes, runny nose, sore throat, mouth sores or tenderness. Lungs: No shortness of breath or cough.  No hemoptysis. Cardiac:  No chest pain, palpitations, orthopnea, or PND. GI:  No nausea, vomiting, diarrhea, constipation, melena or hematochezia.  Colonoscopy 09/07/2015. GU:  No urgency, frequency, dysuria, or hematuria. Musculoskeletal:  No back pain.  No joint pain.  No muscle tenderness. Extremities:  No pain or swelling. Skin:  No rashes or skin changes. Neuro:  No headache, numbness or weakness, balance or coordination issues. Endocrine:  No diabetes, thyroid issues, hot flashes or night sweats. Psych:  No mood changes, depression or anxiety. Pain:  No focal pain. Review of systems:  All other systems reviewed and found to be negative.  Physical Exam: Blood pressure 135/85, pulse 71, temperature 97 F (36.1 C), temperature source Tympanic, resp. rate 18, weight 165 lb 2 oz (74.9 kg). GENERAL:  Well developed, well nourished, woman sitting comfortably in the exam room in no acute distress. MENTAL STATUS:  Alert and oriented to person, place and time. HEAD:  Short styled gray hair.  Normocephalic, atraumatic, face symmetric, no Cushingoid features. EYES:  Glasses.  Blue eyes.  Pupils equal round and reactive to light and accomodation.  No conjunctivitis or scleral icterus. ENT:  Oropharynx clear without lesion.  Tongue normal. Mucous membranes moist.  RESPIRATORY:  Clear to auscultation without rales, wheezes or rhonchi. CARDIOVASCULAR:  Regular rate and rhythm without murmur, rub or gallop. BREAST:  Right breast with fibrocystic changes superiorly.  No  discrete masses, skin changes or nipple discharge.  Left breast with well healed incision at 9 o'clock position.  No masses, skin changes or nipple discharge.  ABDOMEN:  Soft, non-tender, with active bowel sounds, and no hepatosplenomegaly.  No masses. SKIN:  No rashes, ulcers or lesions. EXTREMITIES: No edema, no skin discoloration or tenderness.  No palpable cords. LYMPH NODES: No palpable cervical, supraclavicular, axillary or inguinal adenopathy  NEUROLOGICAL: Unremarkable. PSYCH:  Appropriate.   Appointment on 10/29/2016  Component Date Value Ref Range Status  . WBC 10/29/2016 6.7  3.6 - 11.0 K/uL Final  . RBC 10/29/2016 4.34  3.80 - 5.20 MIL/uL Final  . Hemoglobin 10/29/2016 13.6  12.0 - 16.0 g/dL Final  . HCT 10/29/2016 39.2  35.0 -  47.0 % Final  . MCV 10/29/2016 90.2  80.0 - 100.0 fL Final  . MCH 10/29/2016 31.3  26.0 - 34.0 pg Final  . MCHC 10/29/2016 34.7  32.0 - 36.0 g/dL Final  . RDW 10/29/2016 12.7  11.5 - 14.5 % Final  . Platelets 10/29/2016 253  150 - 440 K/uL Final  . Neutrophils Relative % 10/29/2016 63  % Final  . Neutro Abs 10/29/2016 4.2  1.4 - 6.5 K/uL Final  . Lymphocytes Relative 10/29/2016 30  % Final  . Lymphs Abs 10/29/2016 2.0  1.0 - 3.6 K/uL Final  . Monocytes Relative 10/29/2016 6  % Final  . Monocytes Absolute 10/29/2016 0.4  0.2 - 0.9 K/uL Final  . Eosinophils Relative 10/29/2016 0  % Final  . Eosinophils Absolute 10/29/2016 0.0  0 - 0.7 K/uL Final  . Basophils Relative 10/29/2016 1  % Final  . Basophils Absolute 10/29/2016 0.0  0 - 0.1 K/uL Final  . Sodium 10/29/2016 138  135 - 145 mmol/L Final  . Potassium 10/29/2016 4.3  3.5 - 5.1 mmol/L Final  . Chloride 10/29/2016 107  101 - 111 mmol/L Final  . CO2 10/29/2016 26  22 - 32 mmol/L Final  . Glucose, Bld 10/29/2016 100* 65 - 99 mg/dL Final  . BUN 10/29/2016 17  6 - 20 mg/dL Final  . Creatinine, Ser 10/29/2016 0.81  0.44 - 1.00 mg/dL Final  . Calcium 10/29/2016 9.0  8.9 - 10.3 mg/dL Final  . Total  Protein 10/29/2016 7.3  6.5 - 8.1 g/dL Final  . Albumin 10/29/2016 3.8  3.5 - 5.0 g/dL Final  . AST 10/29/2016 23  15 - 41 U/L Final  . ALT 10/29/2016 19  14 - 54 U/L Final  . Alkaline Phosphatase 10/29/2016 50  38 - 126 U/L Final  . Total Bilirubin 10/29/2016 0.7  0.3 - 1.2 mg/dL Final  . GFR calc non Af Amer 10/29/2016 >60  >60 mL/min Final  . GFR calc Af Amer 10/29/2016 >60  >60 mL/min Final   Comment: (NOTE) The eGFR has been calculated using the CKD EPI equation. This calculation has not been validated in all clinical situations. eGFR's persistently <60 mL/min signify possible Chronic Kidney Disease.   Georgiann Hahn gap 10/29/2016 5  5 - 15 Final    Assessment:  Toni Callahan is a 76 y.o. female with stage I Her2/neu+ left breast cancer s/p lumpectomy and sentinel lymph node biopsy on  02/19/2008.  No pathology is available. Notes indicate pathologic stage [id number].  Tumor was ER positive, PR positive and HER-2/neu 3+.  BCI testing on 07/06/2015 revealed a high risk of late recurrence 8.1% (CI 3.3%-12.6%) at years 5-10 with a high likelihood of benefit of extended adjuvant hormonal therapy.  She received 6 cycles of Taxotere, carboplatin, and Herceptin ([place])  6 cycles (03/16/2008 - 06/29/2008). She received Herceptin every 3 weeks (last cycle 02/15/2009).  She received radiation. She began letrozole (Femara).  She was off therapy for only about 2-3 months.  CA27.29 has been followed: 29.4 on 07/11/2010, 21.2 on 07/24/2011, 23.1 on 02/06/2012, 21.3 on 03/20/2013, 26.0 on 04/30/2014, 40.6 on 04/28/2015, 32.6 on 06/21/2015, and 34.7 on 10/29/2016.  Bilateral diagnostic mammogram on 05/30/2016 revealed no evidence of malignancy (ordered by Dr. Jamal Collin).  Bone density on 05/19/2014 was normal with a T score of 0.3 in the left femur.  Bone density on 05/30/2016 was normal with a T-score of -0.1 in the AP spine (L1-L2).  Symptomatically, she denies any  concerns. Exam is stable.  Plan: 1.   Labs today:  CBC with diff, CMP, CA27.29. 2.  Review interval bone density study.  Density is normal. 3.  Review interval mammogram.  No evidence of malignancy. 4.  Continue Femara. 5.  Continue calcium 1200 mg a day and vitamin D 800 IU a day. 6.  Anticipate next mammogram on 05/26/2017 (ordered by Dr. Jamal Collin). 7.  RTC in 6 months for MD assessment, labs (CBC with diff, CMP, CA27.29).   Lequita Asal, MD  10/29/2016, 11:49 AM

## 2016-10-30 LAB — CANCER ANTIGEN 27.29: CA 27.29: 34.7 U/mL (ref 0.0–38.6)

## 2016-10-30 NOTE — Telephone Encounter (Signed)
Return the pt call. Pt concern about her cholesterol medicine. She states she was put on the meds due to her heart condition. She's wondering since her cholesterol was your  biggest concern, do you want her to stay on the 20 mg dosage and take it every other night or take it everyday.

## 2016-11-05 ENCOUNTER — Other Ambulatory Visit: Payer: Self-pay | Admitting: Family Medicine

## 2016-11-05 MED ORDER — LOVASTATIN 20 MG PO TABS: ORAL_TABLET | ORAL | 3 refills | Status: DC

## 2016-11-05 NOTE — Progress Notes (Signed)
Patient is going to take statin five nights a week; new rx to [facility]

## 2016-12-10 ENCOUNTER — Encounter: Payer: Self-pay | Admitting: Hematology and Oncology

## 2016-12-25 ENCOUNTER — Other Ambulatory Visit: Payer: Self-pay | Admitting: Family Medicine

## 2016-12-27 ENCOUNTER — Other Ambulatory Visit: Payer: Self-pay

## 2016-12-27 MED ORDER — METOPROLOL SUCCINATE ER 50 MG PO TB24: 50.0000 mg | ORAL_TABLET | Freq: Every day | ORAL | 9 refills | Status: DC

## 2016-12-27 NOTE — Telephone Encounter (Signed)
Last OV: 09/16/15 Next OV: None on file  BMP Latest Ref Rng & Units 10/29/2016 09/10/2016 04/26/2016  Glucose 65 - 99 mg/dL 100(H) 102(H) 126(H)  BUN 6 - 20 mg/dL 17 17 18   Creatinine 0.44 - 1.00 mg/dL 0.81 0.71 0.74  BUN/Creat Ratio 11 - 26 - - -  Sodium 135 - 145 mmol/L 138 141 136  Potassium 3.5 - 5.1 mmol/L 4.3 4.5 4.1  Chloride 101 - 111 mmol/L 107 107 106  CO2 22 - 32 mmol/L 26 26 25   Calcium 8.9 - 10.3 mg/dL 9.0 9.3 8.9

## 2017-03-11 ENCOUNTER — Ambulatory Visit (INDEPENDENT_AMBULATORY_CARE_PROVIDER_SITE_OTHER): Payer: Medicare Other | Admitting: Family Medicine

## 2017-03-11 ENCOUNTER — Encounter: Payer: Self-pay | Admitting: Family Medicine

## 2017-03-11 DIAGNOSIS — C50212 Malignant neoplasm of upper-inner quadrant of left female breast: Secondary | ICD-10-CM

## 2017-03-11 DIAGNOSIS — E786 Lipoprotein deficiency: Secondary | ICD-10-CM

## 2017-03-11 DIAGNOSIS — I471 Supraventricular tachycardia: Secondary | ICD-10-CM

## 2017-03-11 DIAGNOSIS — R739 Hyperglycemia, unspecified: Secondary | ICD-10-CM | POA: Diagnosis not present

## 2017-03-11 DIAGNOSIS — E781 Pure hyperglyceridemia: Secondary | ICD-10-CM

## 2017-03-11 DIAGNOSIS — Z5181 Encounter for therapeutic drug level monitoring: Secondary | ICD-10-CM | POA: Diagnosis not present

## 2017-03-11 LAB — COMPLETE METABOLIC PANEL WITH GFR
ALT: 14 U/L (ref 6–29)
AST: 17 U/L (ref 10–35)
Albumin: 3.9 g/dL (ref 3.6–5.1)
Alkaline Phosphatase: 46 U/L (ref 33–130)
BILIRUBIN TOTAL: 0.5 mg/dL (ref 0.2–1.2)
BUN: 16 mg/dL (ref 7–25)
CHLORIDE: 102 mmol/L (ref 98–110)
CO2: 22 mmol/L (ref 20–32)
CREATININE: 0.67 mg/dL (ref 0.60–0.93)
Calcium: 9.3 mg/dL (ref 8.6–10.4)
GFR, EST NON AFRICAN AMERICAN: 85 mL/min (ref >=60)
Glucose, Bld: 91 mg/dL (ref 65–99)
Potassium: 4.3 mmol/L (ref 3.5–5.3)
Sodium: 141 mmol/L (ref 135–146)
TOTAL PROTEIN: 6.5 g/dL (ref 6.1–8.1)

## 2017-03-11 LAB — LIPID PANEL
CHOL/HDL RATIO: 5.4 ratio — AB (ref <5.0)
Cholesterol: 185 mg/dL (ref <200)
HDL: 34 mg/dL — AB (ref >50)
LDL CALC: 109 mg/dL — AB (ref <100)
Triglycerides: 209 mg/dL — ABNORMAL HIGH (ref <150)
VLDL: 42 mg/dL — ABNORMAL HIGH (ref <30)

## 2017-03-11 LAB — CBC WITH DIFFERENTIAL/PLATELET
BASOS ABS: 0 {cells}/uL (ref 0–200)
Basophils Relative: 0 %
EOS ABS: 0 {cells}/uL — AB (ref 15–500)
Eosinophils Relative: 0 %
HCT: 41 % (ref 35.0–45.0)
Hemoglobin: 13.6 g/dL (ref 11.7–15.5)
LYMPHS PCT: 29 %
Lymphs Abs: 2001 cells/uL (ref 850–3900)
MCH: 30.7 pg (ref 27.0–33.0)
MCHC: 33.2 g/dL (ref 32.0–36.0)
MCV: 92.6 fL (ref 80.0–100.0)
MONOS PCT: 6 %
MPV: 9.9 fL (ref 7.5–12.5)
Monocytes Absolute: 414 cells/uL (ref 200–950)
NEUTROS PCT: 65 %
Neutro Abs: 4485 cells/uL (ref 1500–7800)
PLATELETS: 245 10*3/uL (ref 140–400)
RBC: 4.43 MIL/uL (ref 3.80–5.10)
RDW: 12.9 % (ref 11.0–15.0)
WBC: 6.9 10*3/uL (ref 3.8–10.8)

## 2017-03-11 NOTE — Assessment & Plan Note (Signed)
Check lipids today; dietary patterns discussed

## 2017-03-11 NOTE — Assessment & Plan Note (Signed)
Check labs today.

## 2017-03-11 NOTE — Assessment & Plan Note (Signed)
Seeing Dr. Jamal Collin and Dr. Mike Gip; on [facility]

## 2017-03-11 NOTE — Patient Instructions (Signed)
Keep doing what you are doing

## 2017-03-11 NOTE — Assessment & Plan Note (Signed)
Seeing Dr. Nehemiah Massed

## 2017-03-11 NOTE — Progress Notes (Signed)
BP 120/64   Pulse 79   Temp 97.9 F (36.6 C) (Oral)   Resp 14   Wt 166 lb 9.6 oz (75.6 kg)   SpO2 96%   BMI 27.72 kg/m    Subjective:    Patient ID: Toni Callahan, female    DOB: [birth year], 76 y.o.   MRN: [id number]  HPI: Toni Callahan is a 76 y.o. female  Chief Complaint  Patient presents with  . Hyperlipidemia    6 month F/U   HPI Patient is here for 6 month follow-up  She has been going to work out; staying fit and active Blood pressure is beautiful today Dyslipidemia; low HDL, despite all of her activity High TG; trying to stay away from fried foods, sweets; not many starches Splits meals with her husband when going out Supraventricular tachycardia; just saw Dr. Nehemiah Massed 3 weeks ago; not even noticing any sx Breast cancer; monitored by Dr. Jamal Collin and Dr. Mike Gip; sees them; on Femara; getting imaging regularly  Lab Results  Component Value Date   CHOL 153 09/10/2016   CHOL 162 09/29/2015   Lab Results  Component Value Date   HDL 28 (L) 09/10/2016   HDL 36 (L) 09/29/2015   Lab Results  Component Value Date   LDLCALC 79 09/10/2016   [place] 76 09/29/2015   Lab Results  Component Value Date   TRIG 231 (H) 09/10/2016   TRIG 250 (H) 09/29/2015   Lab Results  Component Value Date   CHOLHDL 5.5 (H) 09/10/2016   No results found for: LDLDIRECT   Depression screen [facility] 2/9 03/11/2017 09/10/2016 09/16/2015  Decreased Interest 0 0 0  Down, Depressed, Hopeless 0 0 0  PHQ - 2 Score 0 0 0    Relevant past medical, surgical, family and social history reviewed Past Medical History:  Diagnosis Date  . Allergy   . Breast cancer ([place]) 2009   left breast cancer, chem and radiation  . Cancer ([place]) 2009   left breast, T1, N0, M0. ER/PR positive, HER2 neu positive  . Dental bridge present    permanant - upper  . Personal history of malignant neoplasm of breast 2009   left breast lumpectomy,chemotherapy and radiation therapy  . Personal history of  tobacco use, presenting hazards to health   . Special screening for malignant neoplasms, colon 2012  . Tachycardia    Past Surgical History:  Procedure Laterality Date  . BREAST CYST ASPIRATION Bilateral 2005  . BREAST SURGERY Left 2009   lumpectomy  . CATARACT EXTRACTION W/ INTRAOCULAR LENS IMPLANT Bilateral   . COLONOSCOPY  [id number]   Dr. Candace Cruise  . COLONOSCOPY N/A 09/07/2015   Procedure: COLONOSCOPY;  Surgeon: Hulen Luster, MD;  Location: [facility];  Service: Gastroenterology;  Laterality: N/A;  . POLYPECTOMY  09/07/2015   Procedure: POLYPECTOMY;  Surgeon: Hulen Luster, MD;  Location: [facility];  Service: Gastroenterology;;  . PORT-A-CATH REMOVAL  2011  . PORTACATH PLACEMENT  2009  . TONSILLECTOMY     Family History  Problem Relation Age of Onset  . Stroke Mother   . Heart disease Father   . Diabetes Brother   . Heart disease Paternal Grandmother   . Cancer Neg Hx   . COPD Neg Hx   . Hypertension Neg Hx    Social History   Social History  . Marital status: Married    Spouse name: N/A  . Number of children: N/A  . Years of education: N/A  Occupational History  . Not on file.   Social History Main Topics  . Smoking status: Former Smoker    Years: 20.00    Types: Cigarettes    Quit date: 08/06/1984  . Smokeless tobacco: Never Used     Comment: quit in 1986 approximately  . Alcohol use Yes     Comment: drinks rarely  . Drug use: No  . Sexual activity: Yes   Other Topics Concern  . Not on file   Social History Narrative  . No narrative on file    Interim medical history since last visit reviewed. Allergies and medications reviewed  Review of Systems  Cardiovascular: Negative for chest pain, palpitations and leg swelling.  Gastrointestinal: Negative for abdominal pain and blood in stool.  Genitourinary: Negative for hematuria.   Per HPI unless specifically indicated above     Objective:    BP 120/64   Pulse 79   Temp 97.9 F (36.6 C)  (Oral)   Resp 14   Wt 166 lb 9.6 oz (75.6 kg)   SpO2 96%   BMI 27.72 kg/m   Wt Readings from Last 3 Encounters:  03/11/17 166 lb 9.6 oz (75.6 kg)  10/29/16 165 lb 2 oz (74.9 kg)  09/10/16 167 lb 14.4 oz (76.2 kg)    Physical Exam  Constitutional: She appears well-developed and well-nourished. No distress.  HENT:  Head: Normocephalic and atraumatic.  Eyes: EOM are normal. No scleral icterus.  Neck: No thyromegaly present.  Cardiovascular: Normal rate, regular rhythm and normal heart sounds.   No murmur heard. Pulmonary/Chest: Effort normal and breath sounds normal.  Abdominal: Soft. Bowel sounds are normal. She exhibits no distension.  Musculoskeletal: Normal range of motion. She exhibits no edema.  Neurological: She is alert. She exhibits normal muscle tone.  Skin: Skin is warm and dry. She is not diaphoretic. No pallor.  Psychiatric: She has a normal mood and affect. Her behavior is normal. Judgment and thought content normal.    Results for orders placed or performed in visit on 10/29/16  CBC with Differential/Platelet  Result Value Ref Range   WBC 6.7 3.6 - 11.0 K/uL   RBC 4.34 3.80 - 5.20 MIL/uL   Hemoglobin 13.6 12.0 - 16.0 g/dL   HCT 39.2 35.0 - 47.0 %   MCV 90.2 80.0 - 100.0 fL   MCH 31.3 26.0 - 34.0 pg   MCHC 34.7 32.0 - 36.0 g/dL   RDW 12.7 11.5 - 14.5 %   Platelets 253 150 - 440 K/uL   Neutrophils Relative % 63 %   Neutro Abs 4.2 1.4 - 6.5 K/uL   Lymphocytes Relative 30 %   Lymphs Abs 2.0 1.0 - 3.6 K/uL   Monocytes Relative 6 %   Monocytes Absolute 0.4 0.2 - 0.9 K/uL   Eosinophils Relative 0 %   Eosinophils Absolute 0.0 0 - 0.7 K/uL   Basophils Relative 1 %   Basophils Absolute 0.0 0 - 0.1 K/uL  Comprehensive metabolic panel  Result Value Ref Range   Sodium 138 135 - 145 mmol/L   Potassium 4.3 3.5 - 5.1 mmol/L   Chloride 107 101 - 111 mmol/L   CO2 26 22 - 32 mmol/L   Glucose, Bld 100 (H) 65 - 99 mg/dL   BUN 17 6 - 20 mg/dL   Creatinine, Ser 0.81 0.44  - 1.00 mg/dL   Calcium 9.0 8.9 - 10.3 mg/dL   Total Protein 7.3 6.5 - 8.1 g/dL   Albumin 3.8 3.5 -  5.0 g/dL   AST 23 15 - 41 U/L   ALT 19 14 - 54 U/L   Alkaline Phosphatase 50 38 - 126 U/L   Total Bilirubin 0.7 0.3 - 1.2 mg/dL   GFR calc non Af Amer >60 >60 mL/min   GFR calc Af Amer >60 >60 mL/min   Anion gap 5 5 - 15  Cancer antigen 27.29  Result Value Ref Range   CA 27.29 34.7 0.0 - 38.6 U/mL      Assessment & Plan:   Problem List Items Addressed This Visit      Cardiovascular and Mediastinum   Supraventricular tachycardia ([place])    Seeing Dr. Nehemiah Massed        Other   Medication monitoring encounter    Check labs today      Relevant Orders   CBC with Differential/Platelet   COMPLETE METABOLIC PANEL WITH GFR   Hyperglycemia    Check glucose and A1c      Relevant Orders   Hemoglobin A1c   Breast cancer of upper-inner quadrant of left female breast (HCC)    Seeing Dr. Jamal Collin and Dr. 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  An after-visit summary was printed and given to the patient at [facility].  Please see the patient instructions which may contain other information and recommendations beyond what is mentioned above in the assessment and plan.  No orders of the defined types were placed in this encounter.   Orders Placed This Encounter  Procedures  . CBC with Differential/Platelet  . COMPLETE METABOLIC PANEL WITH GFR  . Hemoglobin A1c  . Lipid panel

## 2017-03-11 NOTE — Assessment & Plan Note (Signed)
Check fasting lipids; try to limit starches and fried foods and sweets

## 2017-03-11 NOTE — Assessment & Plan Note (Signed)
Check glucose and A1c 

## 2017-03-12 LAB — HEMOGLOBIN A1C
Hgb A1c MFr Bld: 5.3 % (ref <5.7)
MEAN PLASMA GLUCOSE: 105 mg/dL

## 2017-03-14 ENCOUNTER — Other Ambulatory Visit: Payer: Self-pay | Admitting: Family Medicine

## 2017-03-14 DIAGNOSIS — E781 Pure hyperglyceridemia: Secondary | ICD-10-CM

## 2017-03-14 DIAGNOSIS — E785 Hyperlipidemia, unspecified: Secondary | ICD-10-CM

## 2017-03-14 MED ORDER — ROSUVASTATIN CALCIUM 5 MG PO TABS: 5.0000 mg | ORAL_TABLET | Freq: Every day | ORAL | 2 refills | Status: DC

## 2017-03-14 NOTE — Progress Notes (Signed)
Will switch cholesterol medicine Recheck lipids around Sept 24th

## 2017-03-14 NOTE — Assessment & Plan Note (Signed)
Change statin 

## 2017-04-23 ENCOUNTER — Other Ambulatory Visit: Payer: Self-pay | Admitting: Hematology and Oncology

## 2017-04-23 DIAGNOSIS — Z1231 Encounter for screening mammogram for malignant neoplasm of breast: Secondary | ICD-10-CM

## 2017-05-06 ENCOUNTER — Ambulatory Visit: Payer: Medicare Other | Admitting: Hematology and Oncology

## 2017-05-06 ENCOUNTER — Other Ambulatory Visit: Payer: Medicare Other

## 2017-05-07 ENCOUNTER — Encounter: Payer: Self-pay | Admitting: Family Medicine

## 2017-05-07 ENCOUNTER — Ambulatory Visit (INDEPENDENT_AMBULATORY_CARE_PROVIDER_SITE_OTHER): Payer: Medicare Other | Admitting: Family Medicine

## 2017-05-07 VITALS — BP 138/70 | HR 89 | Temp 97.3°F | Resp 14 | Wt 170.3 lb

## 2017-05-07 DIAGNOSIS — R252 Cramp and spasm: Secondary | ICD-10-CM

## 2017-05-07 DIAGNOSIS — M25552 Pain in left hip: Secondary | ICD-10-CM

## 2017-05-07 NOTE — Patient Instructions (Addendum)
Try turmeric as a natural anti-inflammatory (for pain and arthritis). It comes in capsules where you buy aspirin and fish oil, but also as a spice where you buy pepper and garlic powder. Stop the Crestor (rosuvastatin) for 3 weeks and let's see how your symptoms change I'll recommend hydration, drink enough caffeine-free beverages to make your urine pale yellow You can try Aleve 220 mg over-the-counter twice a day if needed for discomfort, take with food Tylenol is okay for pain per package directions Call me in 3 weeks, sooner if needed

## 2017-05-07 NOTE — Progress Notes (Signed)
[facility] day:  05/08/2017  Chief Complaint: Toni Callahan is a 76 y.o. female with stage I Her2/neu+ left breast cancer who is seen for 6 month assessment.  HPI:  The patient was last seen in the medical oncology clinic on 10/29/2016.  At that time, patient was doing well, and verbalized no concerns. CBC and chemistries were unremarkable. CA27.29 was 34.7 (previously 32.6). She continued on Femara.  Patient has interval mammogram scheduled for 06/03/2017.  During the interim, patient doing well. She denies physical complaints. She denies any breast concerns.  She is eating well. Her weight is up 4 pounds. Patient continues on Femara with no perceived side effects.    Past Medical History:  Diagnosis Date  . Allergy   . Breast cancer ([place]) 2009   left breast cancer, chem and radiation  . Cancer ([place]) 2009   left breast, T1, N0, M0. ER/PR positive, HER2 neu positive  . Dental bridge present    permanant - upper  . Personal history of malignant neoplasm of breast 2009   left breast lumpectomy,chemotherapy and radiation therapy  . Personal history of tobacco use, presenting hazards to health   . Special screening for malignant neoplasms, colon 2012  . Tachycardia     Past Surgical History:  Procedure Laterality Date  . BREAST CYST ASPIRATION Bilateral 2005  . BREAST SURGERY Left 2009   lumpectomy  . CATARACT EXTRACTION W/ INTRAOCULAR LENS IMPLANT Bilateral   . COLONOSCOPY  [id number]   Dr. Candace Cruise  . COLONOSCOPY N/A 09/07/2015   Procedure: COLONOSCOPY;  Surgeon: Hulen Luster, MD;  Location: [place];  Service: Gastroenterology;  Laterality: N/A;  . POLYPECTOMY  09/07/2015   Procedure: POLYPECTOMY;  Surgeon: Hulen Luster, MD;  Location: [place];  Service: Gastroenterology;;  . PORT-A-CATH REMOVAL  2011  . PORTACATH PLACEMENT  2009  . TONSILLECTOMY      Family History  Problem Relation Age of Onset  . Stroke Mother    . Heart disease Father   . Diabetes Brother   . Heart disease Paternal Grandmother   . Cancer Neg Hx   . COPD Neg Hx   . Hypertension Neg Hx     Social History:  reports that she quit smoking about 32 years ago. Her smoking use included Cigarettes. She quit after 20.00 years of use. She has never used smokeless tobacco. She reports that she drinks alcohol. She reports that she does not use drugs.  She has lived in the area 95 years.  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  She lives in [place].  The patient is alone today.  Allergies:  Allergies  Allergen Reactions  . Sulfa Antibiotics Nausea Only and Other (See Comments)    Upset stomach    Current Medications: Current Outpatient Prescriptions  Medication Sig Dispense Refill  . aspirin 81 MG tablet Take 81 mg by mouth daily.    . Biotin 5000 MCG CAPS Take 1 capsule by mouth daily.    . Calcium Carbonate (CALCIUM 600 PO) Take 1 tablet by mouth every other day.     . Cholecalciferol (VITAMIN D PO) Take 1,000 Int'l Units by mouth daily.    . Cyanocobalamin (VITAMIN B 12 PO) Take 500 mg by mouth daily.    . folic acid (FOLVITE) 401 MCG tablet Take 400 mcg by mouth daily.    Marland Kitchen letrozole (FEMARA) 2.5 MG tablet Take 1 tablet (2.5 mg  total) by mouth daily. 90 tablet 1  . Magnesium 250 MG TABS Take 1 tablet by mouth daily.    . metoprolol succinate (TOPROL-XL) 50 MG 24 hr tablet Take 1 tablet (50 mg total) by mouth daily. 30 tablet 9  . Multiple Vitamin (MULTIVITAMIN) tablet Take 1 tablet by mouth daily.    . Omega-3 Fatty Acids (FISH OIL) 1000 MG CAPS Take 1 capsule by mouth every other day.     . Potassium Gluconate 595 MG CAPS Take by mouth daily.    Marland Kitchen triamcinolone cream (KENALOG) 0.1 % Apply 1 application topically 2 (two) times daily. (Patient taking differently: Apply 1 application topically as needed. ) 30 g 1  . vitamin C (ASCORBIC ACID) 500 MG tablet Take 500 mg by mouth every other day.     . Vitamin D, Ergocalciferol,  (DRISDOL) 50000 units CAPS capsule Take 50,000 Units by mouth every 7 (seven) days.     No current facility-administered medications for this visit.     Review of Systems:  GENERAL:  Feels good.  Active.  No fevers or sweats. Weight up 1 pound since last visit. PERFORMANCE STATUS (ECOG):  0 HEENT:  No visual changes, runny nose, sore throat, mouth sores or tenderness. Lungs: No shortness of breath or cough.  No hemoptysis. Cardiac:  No chest pain, palpitations, orthopnea, or PND. GI:  No nausea, vomiting, diarrhea, constipation, melena or hematochezia.  Colonoscopy 09/07/2015. GU:  No urgency, frequency, dysuria, or hematuria. Musculoskeletal:  No back pain.  No joint pain.  No muscle tenderness. Extremities:  No pain or swelling. Skin:  No rashes or skin changes. Neuro:  No headache, numbness or weakness, balance or coordination issues. Endocrine:  No diabetes, thyroid issues, hot flashes or night sweats. Psych:  No mood changes, depression or anxiety. Pain:  No focal pain. Review of systems:  All other systems reviewed and found to be negative.  Physical Exam: Blood pressure 137/84, pulse 75, temperature 97.9 F (36.6 C), temperature source Tympanic, resp. rate 18, weight 166 lb 3.6 oz (75.4 kg). GENERAL:  Well developed, well nourished, woman sitting comfortably in the exam room in no acute distress. MENTAL STATUS:  Alert and oriented to person, place and time. HEAD:  Short gray hair.  Normocephalic, atraumatic, face symmetric, no Cushingoid features. EYES:  Glasses.  Blue eyes.  Pupils equal round and reactive to light and accomodation.  No conjunctivitis or scleral icterus. ENT:  Oropharynx clear without lesion.  Tongue normal. Mucous membranes moist.  RESPIRATORY:  Clear to auscultation without rales, wheezes or rhonchi. CARDIOVASCULAR:  Regular rate and rhythm without murmur, rub or gallop. BREAST:  Right breast with fibrocystic changes superiorly.  No discrete masses, skin  changes or nipple discharge.  Left breast with well healed incision at 9 o'clock position.  No masses, skin changes or nipple discharge.  ABDOMEN:  Soft, non-tender, with active bowel sounds, and no hepatosplenomegaly.  No masses. SKIN:  No rashes, ulcers or lesions. EXTREMITIES: No edema, no skin discoloration or tenderness.  No palpable cords. LYMPH NODES: No palpable cervical, supraclavicular, axillary or inguinal adenopathy  NEUROLOGICAL: Unremarkable. PSYCH:  Appropriate.   Orders Only on 05/08/2017  Component Date Value Ref Range Status  . WBC 05/08/2017 6.5  3.6 - 11.0 K/uL Final  . RBC 05/08/2017 4.35  3.80 - 5.20 MIL/uL Final  . Hemoglobin 05/08/2017 13.5  12.0 - 16.0 g/dL Final  . HCT 05/08/2017 39.6  35.0 - 47.0 % Final  . MCV 05/08/2017 90.9  80.0 - 100.0 fL Final  . MCH 05/08/2017 30.9  26.0 - 34.0 pg Final  . MCHC 05/08/2017 34.0  32.0 - 36.0 g/dL Final  . RDW 05/08/2017 12.5  11.5 - 14.5 % Final  . Platelets 05/08/2017 236  150 - 440 K/uL Final  . Neutrophils Relative % 05/08/2017 57  % Final  . Neutro Abs 05/08/2017 3.7  1.4 - 6.5 K/uL Final  . Lymphocytes Relative 05/08/2017 35  % Final  . Lymphs Abs 05/08/2017 2.3  1.0 - 3.6 K/uL Final  . Monocytes Relative 05/08/2017 7  % Final  . Monocytes Absolute 05/08/2017 0.5  0.2 - 0.9 K/uL Final  . Eosinophils Relative 05/08/2017 0  % Final  . Eosinophils Absolute 05/08/2017 0.0  0 - 0.7 K/uL Final  . Basophils Relative 05/08/2017 1  % Final  . Basophils Absolute 05/08/2017 0.1  0 - 0.1 K/uL Final  . Sodium 05/08/2017 138  135 - 145 mmol/L Final  . Potassium 05/08/2017 4.2  3.5 - 5.1 mmol/L Final  . Chloride 05/08/2017 106  101 - 111 mmol/L Final  . CO2 05/08/2017 24  22 - 32 mmol/L Final  . Glucose, Bld 05/08/2017 99  65 - 99 mg/dL Final  . BUN 05/08/2017 23* 6 - 20 mg/dL Final  . Creatinine, Ser 05/08/2017 0.68  0.44 - 1.00 mg/dL Final  . Calcium 05/08/2017 8.9  8.9 - 10.3 mg/dL Final  . Total Protein 05/08/2017 7.0   6.5 - 8.1 g/dL Final  . Albumin 05/08/2017 4.1  3.5 - 5.0 g/dL Final  . AST 05/08/2017 24  15 - 41 U/L Final  . ALT 05/08/2017 19  14 - 54 U/L Final  . Alkaline Phosphatase 05/08/2017 47  38 - 126 U/L Final  . Total Bilirubin 05/08/2017 0.5  0.3 - 1.2 mg/dL Final  . GFR calc non Af Amer 05/08/2017 >60  >60 mL/min Final  . GFR calc Af Amer 05/08/2017 >60  >60 mL/min Final   Comment: (NOTE) The eGFR has been calculated using the CKD EPI equation. This calculation has not been validated in all clinical situations. eGFR's persistently <60 mL/min signify possible Chronic Kidney Disease.   Georgiann Hahn gap 05/08/2017 8  5 - 15 Final    Assessment:  Toni Callahan is a 76 y.o. female with stage I Her2/neu+ left breast cancer s/p lumpectomy and sentinel lymph node biopsy on  02/19/2008.  No pathology is available. Notes indicate pathologic stage [id number].  Tumor was ER positive, PR positive and HER-2/neu 3+.  BCI testing on 07/06/2015 revealed a high risk of late recurrence 8.1% (CI 3.3%-12.6%) at years 5-10 with a high likelihood of benefit of extended adjuvant hormonal therapy.  She received 6 cycles of Taxotere, carboplatin, and Herceptin ([place])  6 cycles (03/16/2008 - 06/29/2008). She received Herceptin every 3 weeks (last cycle 02/15/2009).  She received radiation. She began letrozole (Femara).  She was off therapy for only about 2-3 months.  CA27.29 has been followed: 29.4 on 07/11/2010, 21.2 on 07/24/2011, 23.1 on 02/06/2012, 21.3 on 03/20/2013, 26.0 on 04/30/2014, 40.6 on 04/28/2015, 32.6 on 06/21/2015, 34.7 on 10/29/2016, and 28.5 on 05/08/2017.  Bilateral diagnostic mammogram on 05/30/2016 revealed no evidence of malignancy (ordered by Dr. Jamal Collin).  Bone density on 05/19/2014 was normal with a T score of 0.3 in the left femur.  Bone density on 05/30/2016 was normal with a T-score of -0.1 in the AP spine (L1-L2).  Symptomatically, she denies any concerns. Exam is stable. Labs unremarkable.  Plan: 1.  Labs today:  CBC with diff, CMP, CA27.29. 2.  Continue Femara. 3.  Continue calcium 1200 mg a day and vitamin D 800 IU a day. 4.  Screening mammogram scheduled for 06/03/2017. 5.  RTC in 6 months for MD assessment, labs (CBC with diff, CMP, CA27.29).   Honor Loh, NP  05/08/2017, 10:40 AM   I saw and evaluated the patient, participating in the key portions of the service and reviewing pertinent diagnostic studies and records.  I reviewed the nurse practitioner's note and agree with the findings and the plan.  A few questions were asked by the patient and answered.   Nolon Stalls, MD 05/08/2017,10:40 AM

## 2017-05-07 NOTE — Progress Notes (Signed)
BP 138/70 (BP Location: Left Arm, Patient Position: Sitting, Cuff Size: Normal)   Pulse 89   Temp (!) 97.3 F (36.3 C) (Oral)   Resp 14   Wt 170 lb 4.8 oz (77.2 kg)   SpO2 92%   BMI 28.34 kg/m    Subjective:    Patient ID: Toni Callahan, female    DOB: 01-Jun-1941, 76 y.o.   MRN: [id number]  HPI: Toni Callahan is a 76 y.o. female  Chief Complaint  Patient presents with  . Hip Pain    left hip pain when walk up the stairs. Pt states some times when shge sit down she feel the pain but its more when she is going up the stairs.   . Medication Reaction    Pt states she staring feeling fatigue after the new cholesterol was prescribed  . leg cramps    often happen at night     HPI Left hip pain Going on for four to five weeks Hurts going up and down stairs For about four to six weeks, not herself, feels tired; has not changed her diet, has not changed her activity Does her own housework, riding mower, feels more tired; just lately Nothing hurts, just sitting more after chores; just normal things and feeling more tired No muscle aches except for the hip discomfort Leg cramps No SHOB; not more winded We did change her cholesterol medicine and these symptoms have been going on about the same amount of time; gradual  Lab Results  Component Value Date   CHOL 185 03/11/2017   HDL 34 (L) 03/11/2017   LDLCALC 109 (H) 03/11/2017   TRIG 209 (H) 03/11/2017   CHOLHDL 5.4 (H) 03/11/2017    Depression screen PHQ 2/9 05/07/2017 03/11/2017 09/10/2016 09/16/2015  Decreased Interest 0 0 0 0  Down, Depressed, Hopeless 0 0 0 0  PHQ - 2 Score 0 0 0 0   Relevant past medical, surgical, family and social history reviewed Past Medical History:  Diagnosis Date  . Allergy   . Breast cancer ([place]) 2009   left breast cancer, chem and radiation  . Cancer ([place]) 2009   left breast, T1, N0, M0. ER/PR positive, HER2 neu positive  . Dental bridge present    permanant - upper  . Personal  history of malignant neoplasm of breast 2009   left breast lumpectomy,chemotherapy and radiation therapy  . Personal history of tobacco use, presenting hazards to health   . Special screening for malignant neoplasms, colon 2012  . Tachycardia    Past Surgical History:  Procedure Laterality Date  . BREAST CYST ASPIRATION Bilateral 2005  . BREAST SURGERY Left 2009   lumpectomy  . CATARACT EXTRACTION W/ INTRAOCULAR LENS IMPLANT Bilateral   . COLONOSCOPY  [id number]   Dr. Candace Cruise  . COLONOSCOPY N/A 09/07/2015   Procedure: COLONOSCOPY;  Surgeon: Hulen Luster, MD;  Location: [place];  Service: Gastroenterology;  Laterality: N/A;  . POLYPECTOMY  09/07/2015   Procedure: POLYPECTOMY;  Surgeon: Hulen Luster, MD;  Location: [place];  Service: Gastroenterology;;  . PORT-A-CATH REMOVAL  2011  . PORTACATH PLACEMENT  2009  . TONSILLECTOMY     Family History  Problem Relation Age of Onset  . Stroke Mother   . Heart disease Father   . Diabetes Brother   . Heart disease Paternal Grandmother   . Cancer Neg Hx   . COPD Neg Hx   . Hypertension Neg Hx    Social  History   Social History  . Marital status: Married    Spouse name: N/A  . Number of children: N/A  . Years of education: N/A   Occupational History  . Not on file.   Social History Main Topics  . Smoking status: Former Smoker    Years: 20.00    Types: Cigarettes    Quit date: 08/06/1984  . Smokeless tobacco: Never Used     Comment: quit in 1986 approximately  . Alcohol use Yes     Comment: drinks rarely  . Drug use: No  . Sexual activity: Yes   Other Topics Concern  . Not on file   Social History Narrative  . No narrative on file   Interim medical history since last visit reviewed. Allergies and medications reviewed  Review of Systems Per HPI unless specifically indicated above     Objective:    BP 138/70 (BP Location: Left Arm, Patient Position: Sitting, Cuff Size: Normal)   Pulse 89   Temp (!) 97.3 F  (36.3 C) (Oral)   Resp 14   Wt 170 lb 4.8 oz (77.2 kg)   SpO2 92%   BMI 28.34 kg/m   Wt Readings from Last 3 Encounters:  05/07/17 170 lb 4.8 oz (77.2 kg)  03/11/17 166 lb 9.6 oz (75.6 kg)  10/29/16 165 lb 2 oz (74.9 kg)    Physical Exam  Constitutional: She appears well-developed and well-nourished.  HENT:  Mouth/Throat: Mucous membranes are normal.  Eyes: EOM are normal. No scleral icterus.  Cardiovascular: Normal rate.   Pulmonary/Chest: Effort normal.  Musculoskeletal: She exhibits no edema.       Left hip: She exhibits normal range of motion, normal strength, no tenderness, no swelling and no deformity.  Limited in her ability to load weight on the left leg/hip and then start to squat; normal flexion, extension, external rotation  Skin: She is not diaphoretic. No pallor.  Psychiatric: She has a normal mood and affect. Her behavior is normal. Her mood appears not anxious. She does not exhibit a depressed mood.    Results for orders placed or performed in visit on 03/11/17  CBC with Differential/Platelet  Result Value Ref Range   WBC 6.9 3.8 - 10.8 K/uL   RBC 4.43 3.80 - 5.10 MIL/uL   Hemoglobin 13.6 11.7 - 15.5 g/dL   HCT 41.0 35.0 - 45.0 %   MCV 92.6 80.0 - 100.0 fL   MCH 30.7 27.0 - 33.0 pg   MCHC 33.2 32.0 - 36.0 g/dL   RDW 12.9 11.0 - 15.0 %   Platelets 245 140 - 400 K/uL   MPV 9.9 7.5 - 12.5 fL   Neutro Abs 4,485 1,500 - 7,800 cells/uL   Lymphs Abs 2,001 850 - 3,900 cells/uL   Monocytes Absolute 414 200 - 950 cells/uL   Eosinophils Absolute 0 (L) 15 - 500 cells/uL   Basophils Absolute 0 0 - 200 cells/uL   Neutrophils Relative % 65 %   Lymphocytes Relative 29 %   Monocytes Relative 6 %   Eosinophils Relative 0 %   Basophils Relative 0 %   Smear Review Criteria for review not met   COMPLETE METABOLIC PANEL WITH GFR  Result Value Ref Range   Sodium 141 135 - 146 mmol/L   Potassium 4.3 3.5 - 5.3 mmol/L   Chloride 102 98 - 110 mmol/L   CO2 22 20 - 32 mmol/L     Glucose, Bld 91 65 - 99 mg/dL  BUN 16 7 - 25 mg/dL   Creat 0.67 0.60 - 0.93 mg/dL   Total Bilirubin 0.5 0.2 - 1.2 mg/dL   Alkaline Phosphatase 46 33 - 130 U/L   AST 17 10 - 35 U/L   ALT 14 6 - 29 U/L   Total Protein 6.5 6.1 - 8.1 g/dL   Albumin 3.9 3.6 - 5.1 g/dL   Calcium 9.3 8.6 - 10.4 mg/dL   GFR, Est African American >89 >=60 mL/min   GFR, Est Non African American 85 >=60 mL/min  Hemoglobin A1c  Result Value Ref Range   Hgb A1c MFr Bld 5.3 <5.7 %   Mean Plasma Glucose 105 mg/dL  Lipid panel  Result Value Ref Range   Cholesterol 185 <200 mg/dL   Triglycerides 209 (H) <150 mg/dL   HDL 34 (L) >50 mg/dL   Total CHOL/HDL Ratio 5.4 (H) <5.0 Ratio   VLDL 42 (H) <30 mg/dL   LDL Cholesterol 109 (H) <100 mg/dL      Assessment & Plan:   Problem List Items Addressed This Visit    None    Visit Diagnoses    Hip pain, acute, left    -  Primary   may be related to statin use; stop statin for 3 weeks then call me; will proceed with xrays, etc if not alleviated; try turmeric, aleve, tylenol see AVS   Leg cramps       may be secondary to statin; will stop statin x 3 weeks, then call me; try turmeric       Follow up plan: No Follow-up on file.  An after-visit summary was printed and given to the patient at [facility].  Please see the patient instructions which may contain other information and recommendations beyond what is mentioned above in the assessment and plan.  No orders of the defined types were placed in this encounter.   No orders of the defined types were placed in this encounter.

## 2017-05-08 ENCOUNTER — Inpatient Hospital Stay: Payer: Medicare Other | Attending: Hematology and Oncology

## 2017-05-08 ENCOUNTER — Inpatient Hospital Stay (HOSPITAL_BASED_OUTPATIENT_CLINIC_OR_DEPARTMENT_OTHER): Payer: Medicare Other | Admitting: Hematology and Oncology

## 2017-05-08 ENCOUNTER — Other Ambulatory Visit: Payer: Self-pay | Admitting: *Deleted

## 2017-05-08 ENCOUNTER — Other Ambulatory Visit: Payer: Self-pay

## 2017-05-08 VITALS — BP 137/84 | HR 75 | Temp 97.9°F | Resp 18 | Wt 166.2 lb

## 2017-05-08 DIAGNOSIS — Z79899 Other long term (current) drug therapy: Secondary | ICD-10-CM | POA: Insufficient documentation

## 2017-05-08 DIAGNOSIS — Z87891 Personal history of nicotine dependence: Secondary | ICD-10-CM | POA: Insufficient documentation

## 2017-05-08 DIAGNOSIS — Z79811 Long term (current) use of aromatase inhibitors: Secondary | ICD-10-CM

## 2017-05-08 DIAGNOSIS — Z9221 Personal history of antineoplastic chemotherapy: Secondary | ICD-10-CM

## 2017-05-08 DIAGNOSIS — C50212 Malignant neoplasm of upper-inner quadrant of left female breast: Secondary | ICD-10-CM | POA: Diagnosis not present

## 2017-05-08 DIAGNOSIS — Z170 Estrogen receptor positive status [ER+]: Secondary | ICD-10-CM | POA: Insufficient documentation

## 2017-05-08 DIAGNOSIS — Z923 Personal history of irradiation: Secondary | ICD-10-CM | POA: Diagnosis not present

## 2017-05-08 DIAGNOSIS — Z853 Personal history of malignant neoplasm of breast: Secondary | ICD-10-CM

## 2017-05-08 DIAGNOSIS — Z7982 Long term (current) use of aspirin: Secondary | ICD-10-CM | POA: Insufficient documentation

## 2017-05-08 LAB — CBC WITH DIFFERENTIAL/PLATELET
Basophils Absolute: 0.1 10*3/uL (ref 0–0.1)
Basophils Relative: 1 %
Eosinophils Absolute: 0 10*3/uL (ref 0–0.7)
Eosinophils Relative: 0 %
HCT: 39.6 % (ref 35.0–47.0)
Hemoglobin: 13.5 g/dL (ref 12.0–16.0)
Lymphocytes Relative: 35 %
Lymphs Abs: 2.3 10*3/uL (ref 1.0–3.6)
MCH: 30.9 pg (ref 26.0–34.0)
MCHC: 34 g/dL (ref 32.0–36.0)
MCV: 90.9 fL (ref 80.0–100.0)
Monocytes Absolute: 0.5 10*3/uL (ref 0.2–0.9)
Monocytes Relative: 7 %
Neutro Abs: 3.7 10*3/uL (ref 1.4–6.5)
Neutrophils Relative %: 57 %
Platelets: 236 10*3/uL (ref 150–440)
RBC: 4.35 MIL/uL (ref 3.80–5.20)
RDW: 12.5 % (ref 11.5–14.5)
WBC: 6.5 10*3/uL (ref 3.6–11.0)

## 2017-05-08 LAB — COMPREHENSIVE METABOLIC PANEL
ALT: 19 U/L (ref 14–54)
AST: 24 U/L (ref 15–41)
Albumin: 4.1 g/dL (ref 3.5–5.0)
Alkaline Phosphatase: 47 U/L (ref 38–126)
Anion gap: 8 (ref 5–15)
BUN: 23 mg/dL — ABNORMAL HIGH (ref 6–20)
CO2: 24 mmol/L (ref 22–32)
Calcium: 8.9 mg/dL (ref 8.9–10.3)
Chloride: 106 mmol/L (ref 101–111)
Creatinine, Ser: 0.68 mg/dL (ref 0.44–1.00)
GFR calc Af Amer: >60 mL/min (ref >60)
GFR calc non Af Amer: >60 mL/min (ref >60)
Glucose, Bld: 99 mg/dL (ref 65–99)
Potassium: 4.2 mmol/L (ref 3.5–5.1)
Sodium: 138 mmol/L (ref 135–145)
Total Bilirubin: 0.5 mg/dL (ref 0.3–1.2)
Total Protein: 7 g/dL (ref 6.5–8.1)

## 2017-05-08 NOTE — Progress Notes (Signed)
Patient offers no complaints today. 

## 2017-05-09 LAB — CANCER ANTIGEN 27.29: CA 27.29: 28.5 U/mL (ref 0.0–38.6)

## 2017-05-21 ENCOUNTER — Telehealth: Payer: Self-pay | Admitting: Family Medicine

## 2017-05-21 NOTE — Telephone Encounter (Signed)
Patient called and not sure who called her, left detail message to return call if she knows who left the message, because there is no documentation.

## 2017-05-21 NOTE — Telephone Encounter (Signed)
Pt requesting return call. States that she received a call the other day something a bout a form for clearance for rehab. (there is no paperwork up front). Will not be home around 10:30-2:30 [phone]

## 2017-05-23 ENCOUNTER — Telehealth: Payer: Self-pay

## 2017-05-23 NOTE — Telephone Encounter (Signed)
Called pt no answer. LM for pt informing her that per Dr.Lada she needs to get clearance from her cardiologist [facility] before she will be able to complete her form clearing her to exercise.

## 2017-05-24 NOTE — Telephone Encounter (Signed)
PER JAMIE AND THE PATIENT  THE CARDIOLOGIST IS JUST SENDING ON OVER TO THE HOSP SO LADA DOES NOT NEED TO DO ANYTHING.

## 2017-06-03 ENCOUNTER — Ambulatory Visit
Admission: RE | Admit: 2017-06-03 | Discharge: 2017-06-03 | Disposition: A | Discharge status: Home / Self Care | Payer: Medicare Other | Source: Ambulatory Visit | Attending: Hematology and Oncology | Admitting: Hematology and Oncology

## 2017-06-03 DIAGNOSIS — Z1231 Encounter for screening mammogram for malignant neoplasm of breast: Secondary | ICD-10-CM | POA: Diagnosis not present

## 2017-06-03 HISTORY — DX: Personal history of antineoplastic chemotherapy: Z92.21

## 2017-06-03 HISTORY — DX: Personal history of irradiation: Z92.3

## 2017-06-09 ENCOUNTER — Encounter: Payer: Self-pay | Admitting: Hematology and Oncology

## 2017-06-10 ENCOUNTER — Encounter: Payer: Self-pay | Admitting: General Surgery

## 2017-06-10 ENCOUNTER — Ambulatory Visit: Payer: Medicare Other | Admitting: General Surgery

## 2017-06-10 VITALS — BP 138/74 | HR 68 | Resp 14 | Ht 66.0 in | Wt 165.0 lb

## 2017-06-10 DIAGNOSIS — C50212 Malignant neoplasm of upper-inner quadrant of left female breast: Secondary | ICD-10-CM | POA: Diagnosis not present

## 2017-06-10 NOTE — Progress Notes (Signed)
Patient ID: Toni Callahan, adult   DOB: [birth year], 76 y.o.   MRN: [id number]  Chief Complaint  Patient presents with  . Follow-up    HPI Toni Callahan is a 76 y.o. adult who presents for a breast cancer follow up. History of left breast cancer dx in 2009 (T1, N0, M0; ER/PR positive, HER2neu positive).  The most recent mammogram was done on 06/03/2017.  Patient does perform regular self breast checks and gets regular mammograms done.    HPI  Past Medical History:  Diagnosis Date  . Allergy   . Cancer ([facility]) 2009   left breast, T1, N0, M0. ER/PR positive, HER2 neu positive  . Dental bridge present    permanant - upper  . Personal history of tobacco use, presenting hazards to health   . Tachycardia     Past Surgical History:  Procedure Laterality Date  . BREAST BIOPSY Left 2009  . BREAST CYST ASPIRATION Bilateral 2005   neg  . BREAST LUMPECTOMY Left 2009   positive  . BREAST SURGERY Left 2009   lumpectomy  . CATARACT EXTRACTION W/ INTRAOCULAR LENS IMPLANT Bilateral   . COLONOSCOPY  [id number]   Dr. Bluford Kaufmann  . PORT-A-CATH REMOVAL  2011  . PORTACATH PLACEMENT  2009  . TONSILLECTOMY      Family History  Problem Relation Age of Onset  . Stroke Mother   . Heart disease Father   . Diabetes Brother   . Heart disease Paternal Grandmother   . Cancer Neg Hx   . COPD Neg Hx   . Hypertension Neg Hx   . Breast cancer Neg Hx     Social History Social History   Tobacco Use  . Smoking status: Former Smoker    Years: 20.00    Types: Cigarettes    Last attempt to quit: 08/06/1984    Years since quitting: 32.8  . Smokeless tobacco: Never Used  . Tobacco comment: quit in 1986 approximately  Substance Use Topics  . Alcohol use: Yes    Comment: drinks rarely  . Drug use: No    Allergies  Allergen Reactions  . Sulfa Antibiotics Nausea Only and Other (See Comments)    Upset stomach    Current Outpatient Medications  Medication Sig Dispense Refill  . aspirin 81 MG  tablet Take 81 mg by mouth daily.    . Biotin 5000 MCG CAPS Take 1 capsule by mouth daily.    . Calcium Carbonate (CALCIUM 600 PO) Take 1 tablet by mouth every other day.     . Cholecalciferol (VITAMIN D PO) Take 1,000 Int'l Units by mouth daily.    . Cyanocobalamin (VITAMIN B 12 PO) Take 500 mg by mouth daily.    . folic acid (FOLVITE) 400 MCG tablet Take 400 mcg by mouth daily.    Marland Kitchen letrozole (FEMARA) 2.5 MG tablet Take 1 tablet (2.5 mg total) by mouth daily. 90 tablet 1  . Magnesium 250 MG TABS Take 1 tablet by mouth daily.    . metoprolol succinate (TOPROL-XL) 50 MG 24 hr tablet Take 1 tablet (50 mg total) by mouth daily. 30 tablet 9  . Multiple Vitamin (MULTIVITAMIN) tablet Take 1 tablet by mouth daily.    . Omega-3 Fatty Acids (FISH OIL) 1000 MG CAPS Take 1 capsule by mouth every other day.     . Potassium Gluconate 595 MG CAPS Take by mouth daily.    Marland Kitchen triamcinolone cream (KENALOG) 0.1 % Apply 1 application topically 2 (two) times  daily. (Patient taking differently: Apply 1 application topically as needed. ) 30 g 1  . vitamin C (ASCORBIC ACID) 500 MG tablet Take 500 mg by mouth every other day.      No current facility-administered medications for this visit.     Review of Systems Review of Systems  Constitutional: Negative.   Respiratory: Negative.   Cardiovascular: Negative.     Blood pressure 138/74, pulse 68, resp. rate 14, height 5\' 6"  (1.676 m), weight 165 lb (74.8 kg).  Physical Exam Physical Exam  Constitutional: She is oriented to person, place, and time. She appears well-developed and well-nourished.  Eyes: Conjunctivae are normal. No scleral icterus.  Neck: Neck supple. No thyromegaly present.  Cardiovascular: Normal rate, regular rhythm and normal heart sounds.  Pulmonary/Chest: Effort normal and breath sounds normal. Right breast exhibits no inverted nipple, no mass, no nipple discharge, no skin change and no tenderness. Left breast exhibits no inverted nipple, no  mass, no nipple discharge, no skin change and no tenderness. Breasts are symmetrical.    Abdominal: Soft. Bowel sounds are normal. She exhibits no mass. There is no hepatomegaly. There is no tenderness.  Lymphadenopathy:    She has no cervical adenopathy.    She has no axillary adenopathy.  Neurological: She is alert and oriented to person, place, and time.  Skin: Skin is warm and dry.    Data Reviewed Mammogram, previous notes Mammogram was not suspicious for malignancy. Dystrophic calcifications stable from previous.   Assessment  History of left breast cancer dx in 2009 (T1, N0, M0; ER/PR positive, HER2neu positive). S/p lumpectomy and chemoradiation.  Ca 27,29 05/09/17 was 28.5. Mammogram and physical exam stable.       Plan    Follow up in one year with bilateral screening mammograms with Dr. Lemar Livings  The patient is aware to call back for any questions or concerns.     HPI, Physical Exam, Assessment and Plan have been scribed under the direction and in the presence of Kathreen Cosier, MD  Ples Specter, CMA    I have completed the exam and reviewed the above documentation for accuracy and completeness.  I agree with the above.  Museum/gallery conservator has been used and any errors in dictation or transcription are unintentional.  Jazzmon Prindle G. Evette Cristal, M.D., F.A.C.S.  Gerlene Burdock G 06/10/2017, 3:18 PM

## 2017-06-10 NOTE — Patient Instructions (Signed)
Follow up in one year with bilateral screening mammograms with Dr. Bary Castilla  The patient is aware to call back for any questions or concerns.

## 2017-07-31 ENCOUNTER — Other Ambulatory Visit: Payer: Self-pay | Admitting: Hematology and Oncology

## 2017-08-14 ENCOUNTER — Telehealth: Payer: Self-pay | Admitting: Family Medicine

## 2017-08-14 NOTE — Telephone Encounter (Signed)
I'm going through oustanding lab orders Patient is overdue to have her cholesterol rechecked Please ask him to come in fasting (since her TG were high) and we'll recheck that soon Document what she's been doing with her cholesterol medicine please Thank you

## 2017-08-15 NOTE — Telephone Encounter (Signed)
Left detailed voicemail

## 2017-09-11 ENCOUNTER — Ambulatory Visit (INDEPENDENT_AMBULATORY_CARE_PROVIDER_SITE_OTHER): Payer: Medicare Other | Admitting: Family Medicine

## 2017-09-11 ENCOUNTER — Telehealth: Payer: Self-pay | Admitting: Family Medicine

## 2017-09-11 ENCOUNTER — Other Ambulatory Visit: Payer: Self-pay

## 2017-09-11 ENCOUNTER — Encounter: Payer: Self-pay | Admitting: Family Medicine

## 2017-09-11 VITALS — BP 136/78 | HR 78 | Temp 97.8°F | Resp 14 | Ht 65.38 in | Wt 164.4 lb

## 2017-09-11 DIAGNOSIS — E781 Pure hyperglyceridemia: Secondary | ICD-10-CM

## 2017-09-11 DIAGNOSIS — E785 Hyperlipidemia, unspecified: Secondary | ICD-10-CM

## 2017-09-11 DIAGNOSIS — Z7189 Other specified counseling: Secondary | ICD-10-CM

## 2017-09-11 DIAGNOSIS — Z789 Other specified health status: Secondary | ICD-10-CM | POA: Diagnosis not present

## 2017-09-11 DIAGNOSIS — C50212 Malignant neoplasm of upper-inner quadrant of left female breast: Secondary | ICD-10-CM | POA: Diagnosis not present

## 2017-09-11 DIAGNOSIS — Z1211 Encounter for screening for malignant neoplasm of colon: Secondary | ICD-10-CM | POA: Diagnosis not present

## 2017-09-11 DIAGNOSIS — Z0000 Encounter for general adult medical examination without abnormal findings: Secondary | ICD-10-CM | POA: Insufficient documentation

## 2017-09-11 LAB — LIPID PANEL
CHOLESTEROL: 208 mg/dL — AB (ref <200)
HDL: 32 mg/dL — ABNORMAL LOW (ref >50)
LDL Cholesterol (Calc): 139 mg/dL (calc) — ABNORMAL HIGH
Non-HDL Cholesterol (Calc): 176 mg/dL (calc) — ABNORMAL HIGH (ref <130)
Total CHOL/HDL Ratio: 6.5 (calc) — ABNORMAL HIGH (ref <5.0)
Triglycerides: 227 mg/dL — ABNORMAL HIGH (ref <150)

## 2017-09-11 NOTE — Assessment & Plan Note (Signed)
Reviewed colonoscopy and path report; next screening in 2022; long life expectancy and will continue cologuard until age 77

## 2017-09-11 NOTE — Patient Instructions (Addendum)
Health Maintenance  Topic Date Due  . DEXA SCAN  05/30/2018  . MAMMOGRAM  06/03/2018  . TETANUS/TDAP  08/26/2023  . INFLUENZA VACCINE  Completed  . PNA vac Low Risk Adult  Completed    Fall Prevention in the Home Falls can cause injuries. They can happen to people of all ages. There are many things you can do to make your home safe and to help prevent falls. What can I do on the outside of my home?  Regularly fix the edges of walkways and driveways and fix any cracks.  Remove anything that might make you trip as you walk through a door, such as a raised step or threshold.  Trim any bushes or trees on the path to your home.  Use bright outdoor lighting.  Clear any walking paths of anything that might make someone trip, such as rocks or tools.  Regularly check to see if handrails are loose or broken. Make sure that both sides of any steps have handrails.  Any raised decks and porches should have guardrails on the edges.  Have any leaves, snow, or ice cleared regularly.  Use sand or salt on walking paths during winter.  Clean up any spills in your garage right away. This includes oil or grease spills. What can I do in the bathroom?  Use night lights.  Install grab bars by the toilet and in the tub and shower. Do not use towel bars as grab bars.  Use non-skid mats or decals in the tub or shower.  If you need to sit down in the shower, use a plastic, non-slip stool.  Keep the floor dry. Clean up any water that spills on the floor as soon as it happens.  Remove soap buildup in the tub or shower regularly.  Attach bath mats securely with double-sided non-slip rug tape.  Do not have throw rugs and other things on the floor that can make you trip. What can I do in the bedroom?  Use night lights.  Make sure that you have a light by your bed that is easy to reach.  Do not use any sheets or blankets that are too big for your bed. They should not hang down onto the  floor.  Have a firm chair that has side arms. You can use this for support while you get dressed.  Do not have throw rugs and other things on the floor that can make you trip. What can I do in the kitchen?  Clean up any spills right away.  Avoid walking on wet floors.  Keep items that you use a lot in easy-to-reach places.  If you need to reach something above you, use a strong step stool that has a grab bar.  Keep electrical cords out of the way.  Do not use floor polish or wax that makes floors slippery. If you must use wax, use non-skid floor wax.  Do not have throw rugs and other things on the floor that can make you trip. What can I do with my stairs?  Do not leave any items on the stairs.  Make sure that there are handrails on both sides of the stairs and use them. Fix handrails that are broken or loose. Make sure that handrails are as long as the stairways.  Check any carpeting to make sure that it is firmly attached to the stairs. Fix any carpet that is loose or worn.  Avoid having throw rugs at the top or bottom of  the stairs. If you do have throw rugs, attach them to the floor with carpet tape.  Make sure that you have a light switch at the top of the stairs and the bottom of the stairs. If you do not have them, ask someone to add them for you. What else can I do to help prevent falls?  Wear shoes that: ? Do not have high heels. ? Have rubber bottoms. ? Are comfortable and fit you well. ? Are closed at the toe. Do not wear sandals.  If you use a stepladder: ? Make sure that it is fully opened. Do not climb a closed stepladder. ? Make sure that both sides of the stepladder are locked into place. ? Ask someone to hold it for you, if possible.  Clearly mark and make sure that you can see: ? Any grab bars or handrails. ? First and last steps. ? Where the edge of each step is.  Use tools that help you move around (mobility aids) if they are needed. These  include: ? Canes. ? Walkers. ? Scooters. ? Crutches.  Turn on the lights when you go into a dark area. Replace any light bulbs as soon as they burn out.  Set up your furniture so you have a clear path. Avoid moving your furniture around.  If any of your floors are uneven, fix them.  If there are any pets around you, be aware of where they are.  Review your medicines with your doctor. Some medicines can make you feel dizzy. This can increase your chance of falling. Ask your doctor what other things that you can do to help prevent falls. This information is not intended to replace advice given to you by your health care provider. Make sure you discuss any questions you have with your health care provider. Document Released: 05/19/2009 Document Revised: 12/29/2015 Document Reviewed: 08/27/2014 Elsevier Interactive Patient Education  2018 [place] Maintenance for Postmenopausal Women Menopause is a normal process in which your reproductive ability comes to an end. This process happens gradually over a span of months to years, usually between the ages of 64 and 29. Menopause is complete when you have missed 12 consecutive menstrual periods. It is important to talk with your health care provider about some of the most common conditions that affect postmenopausal women, such as heart disease, cancer, and bone loss (osteoporosis). Adopting a healthy lifestyle and getting preventive care can help to promote your health and wellness. Those actions can also lower your chances of developing some of these common conditions. What should I know about menopause? During menopause, you may experience a number of symptoms, such as:  Moderate-to-severe hot flashes.  Night sweats.  Decrease in sex drive.  Mood swings.  Headaches.  Tiredness.  Irritability.  Memory problems.  Insomnia.  Choosing to treat or not to treat menopausal changes is an individual decision that you make  with your health care provider. What should I know about hormone replacement therapy and supplements? Hormone therapy products are effective for treating symptoms that are associated with menopause, such as hot flashes and night sweats. Hormone replacement carries certain risks, especially as you become older. If you are thinking about using estrogen or estrogen with progestin treatments, discuss the benefits and risks with your health care provider. What should I know about heart disease and stroke? Heart disease, heart attack, and stroke become more likely as you age. This may be due, in part, to the hormonal changes that  your body experiences during menopause. These can affect how your body processes dietary fats, triglycerides, and cholesterol. Heart attack and stroke are both medical emergencies. There are many things that you can do to help prevent heart disease and stroke:  Have your blood pressure checked at least every 1-2 years. High blood pressure causes heart disease and increases the risk of stroke.  If you are 51-3 years old, ask your health care provider if you should take aspirin to prevent a heart attack or a stroke.  Do not use any tobacco products, including cigarettes, chewing tobacco, or electronic cigarettes. If you need help quitting, ask your health care provider.  It is important to eat a healthy diet and maintain a healthy weight. ? Be sure to include plenty of vegetables, fruits, low-fat dairy products, and lean protein. ? Avoid eating foods that are high in solid fats, added sugars, or salt (sodium).  Get regular exercise. This is one of the most important things that you can do for your health. ? Try to exercise for at least 150 minutes each week. The type of exercise that you do should increase your heart rate and make you sweat. This is known as moderate-intensity exercise. ? Try to do strengthening exercises at least twice each week. Do these in addition to the  moderate-intensity exercise.  Know your numbers.Ask your health care provider to check your cholesterol and your blood glucose. Continue to have your blood tested as directed by your health care provider.  What should I know about cancer screening? There are several types of cancer. Take the following steps to reduce your risk and to catch any cancer development as early as possible. Breast Cancer  Practice breast self-awareness. ? This means understanding how your breasts normally appear and feel. ? It also means doing regular breast self-exams. Let your health care provider know about any changes, no matter how small.  If you are 69 or older, have a clinician do a breast exam (clinical breast exam or CBE) every year. Depending on your age, family history, and medical history, it may be recommended that you also have a yearly breast X-ray (mammogram).  If you have a family history of breast cancer, talk with your health care provider about genetic screening.  If you are at high risk for breast cancer, talk with your health care provider about having an MRI and a mammogram every year.  Breast cancer (BRCA) gene test is recommended for women who have family members with BRCA-related cancers. Results of the assessment will determine the need for genetic counseling and BRCA1 and for BRCA2 testing. BRCA-related cancers include these types: ? Breast. This occurs in males or females. ? Ovarian. ? Tubal. This may also be called fallopian tube cancer. ? Cancer of the abdominal or pelvic lining (peritoneal cancer). ? Prostate. ? Pancreatic.  Cervical, Uterine, and Ovarian Cancer Your health care provider may recommend that you be screened regularly for cancer of the pelvic organs. These include your ovaries, uterus, and vagina. This screening involves a pelvic exam, which includes checking for microscopic changes to the surface of your cervix (Pap test).  For women ages 21-65, health care  providers may recommend a pelvic exam and a Pap test every three years. For women ages 36-65, they may recommend the Pap test and pelvic exam, combined with testing for human papilloma virus (HPV), every five years. Some types of HPV increase your risk of cervical cancer. Testing for HPV may also be done on  women of any age who have unclear Pap test results.  Other health care providers may not recommend any screening for nonpregnant women who are considered low risk for pelvic cancer and have no symptoms. Ask your health care provider if a screening pelvic exam is right for you.  If you have had past treatment for cervical cancer or a condition that could lead to cancer, you need Pap tests and screening for cancer for at least 20 years after your treatment. If Pap tests have been discontinued for you, your risk factors (such as having a new sexual partner) need to be reassessed to determine if you should start having screenings again. Some women have medical problems that increase the chance of getting cervical cancer. In these cases, your health care provider may recommend that you have screening and Pap tests more often.  If you have a family history of uterine cancer or ovarian cancer, talk with your health care provider about genetic screening.  If you have vaginal bleeding after reaching menopause, tell your health care provider.  There are currently no reliable tests available to screen for ovarian cancer.  Lung Cancer Lung cancer screening is recommended for adults 91-63 years old who are at high risk for lung cancer because of a history of smoking. A yearly low-dose CT scan of the lungs is recommended if you:  Currently smoke.  Have a history of at least 30 pack-years of smoking and you currently smoke or have quit within the past 15 years. A pack-year is smoking an average of one pack of cigarettes per day for one year.  Yearly screening should:  Continue until it has been 15 years  since you quit.  Stop if you develop a health problem that would prevent you from having lung cancer treatment.  Colorectal Cancer  This type of cancer can be detected and can often be prevented.  Routine colorectal cancer screening usually begins at age 31 and continues through age 11.  If you have risk factors for colon cancer, your health care provider may recommend that you be screened at an earlier age.  If you have a family history of colorectal cancer, talk with your health care provider about genetic screening.  Your health care provider may also recommend using home test kits to check for hidden blood in your stool.  A small camera at the end of a tube can be used to examine your colon directly (sigmoidoscopy or colonoscopy). This is done to check for the earliest forms of colorectal cancer.  Direct examination of the colon should be repeated every 5-10 years until age 5. However, if early forms of precancerous polyps or small growths are found or if you have a family history or genetic risk for colorectal cancer, you may need to be screened more often.  Skin Cancer  Check your skin from head to toe regularly.  Monitor any moles. Be sure to tell your health care provider: ? About any new moles or changes in moles, especially if there is a change in a mole's shape or color. ? If you have a mole that is larger than the size of a pencil eraser.  If any of your family members has a history of skin cancer, especially at a young age, talk with your health care provider about genetic screening.  Always use sunscreen. Apply sunscreen liberally and repeatedly throughout the day.  Whenever you are outside, protect yourself by wearing long sleeves, pants, a wide-brimmed hat, and sunglasses.  What should I know about osteoporosis? Osteoporosis is a condition in which bone destruction happens more quickly than new bone creation. After menopause, you may be at an increased risk for  osteoporosis. To help prevent osteoporosis or the bone fractures that can happen because of osteoporosis, the following is recommended:  If you are 3-9 years old, get at least 1,000 mg of calcium and at least 600 mg of vitamin D per day.  If you are older than age 13 but younger than age 25, get at least 1,200 mg of calcium and at least 600 mg of vitamin D per day.  If you are older than age 79, get at least 1,200 mg of calcium and at least 800 mg of vitamin D per day.  Smoking and excessive alcohol intake increase the risk of osteoporosis. Eat foods that are rich in calcium and vitamin D, and do weight-bearing exercises several times each week as directed by your health care provider. What should I know about how menopause affects my mental health? Depression may occur at any age, but it is more common as you become older. Common symptoms of depression include:  Low or sad mood.  Changes in sleep patterns.  Changes in appetite or eating patterns.  Feeling an overall lack of motivation or enjoyment of activities that you previously enjoyed.  Frequent crying spells.  Talk with your health care provider if you think that you are experiencing depression. What should I know about immunizations? It is important that you get and maintain your immunizations. These include:  Tetanus, diphtheria, and pertussis (Tdap) booster vaccine.  Influenza every year before the flu season begins.  Pneumonia vaccine.  Shingles vaccine.  Your health care provider may also recommend other immunizations. This information is not intended to replace advice given to you by your health care provider. Make sure you discuss any questions you have with your health care provider. Document Released: 09/14/2005 Document Revised: 02/10/2016 Document Reviewed: 04/26/2015 Elsevier Interactive Patient Education  2018 [employer].

## 2017-09-11 NOTE — Assessment & Plan Note (Signed)
USPSTF grade A and B recommendations reviewed with patient; age-appropriate recommendations, preventive care, screening tests, etc discussed and encouraged; healthy living encouraged; see AVS for patient education given to patient  

## 2017-09-11 NOTE — Assessment & Plan Note (Signed)
Managed by surgeon and oncologist

## 2017-09-11 NOTE — Progress Notes (Signed)
Patient: Toni Callahan, Adult    DOB: 1941-07-28, 77 y.o.   MRN: [id number]  Visit Date: 09/11/2017  Today's Provider: Enid Derry, MD   Chief Complaint  Patient presents with  . Medicare Wellness    Subjective:   Toni Callahan is a 77 y.o. adult who presents today for her Subsequent Annual Wellness Visit.  USPSTF grade A and B recommendations Depression:  Depression screen [facility] 2/9 09/11/2017 05/07/2017 03/11/2017 09/10/2016 09/16/2015  Decreased Interest 0 0 0 0 0  Down, Depressed, Hopeless 0 0 0 0 0  PHQ - 2 Score 0 0 0 0 0   Hypertension: BP Readings from Last 3 Encounters:  09/11/17 136/78  06/10/17 138/74  05/08/17 137/84   Obesity: Wt Readings from Last 3 Encounters:  09/11/17 164 lb 6.4 oz (74.6 kg)  06/10/17 165 lb (74.8 kg)  05/08/17 166 lb 3.6 oz (75.4 kg)   BMI Readings from Last 3 Encounters:  09/11/17 27.04 kg/m  06/10/17 26.63 kg/m  05/08/17 27.66 kg/m    Skin cancer: no worrisome moles Lung cancer:  Former, quit 1986 Breast cancer: 10 years ago, LEFT breast, stage 1; sees Psychologist, sport and exercise and oncologist; both Colorectal cancer: recommend cologuard until age 77; next due 2022 Cervical cancer screening: n/a HIV, hep B, hep C: not interested STD testing and prevention (chl/gon/syphilis): not interested Intimate partner violence: no abuse Contraception: n/a Osteoporosis: last DEXA normal; October 2017 Fall prevention/vitamin D: discussed; getting exercise, discussed calcium, vit D  Immunizations: pneumonia vaccines UTD, shingrix UTD; flu UTD Diet: calcium, not many fatty meats; nuts Exercise: yes Alcohol: wine, 7 a week Tobacco use: former AAA: n/a Aspirin: taking 81 mg; no bleeding Glucose: last A1c normal Glucose  Date Value Ref Range Status  04/30/2014 90 65 - 99 mg/dL Final  10/16/2013 109 (H) 65 - 99 mg/dL Final  03/20/2013 96 65 - 99 mg/dL Final   Glucose, Bld  Date Value Ref Range Status  05/08/2017 99 65 - 99 mg/dL Final  03/11/2017 91 65 -  99 mg/dL Final  10/29/2016 100 (H) 65 - 99 mg/dL Final   Lipids: not taking cholesterol medicine now; started to have hip pains on the low dose Crestor; stopped it and pain went away; she has been off of a statin now for probably six months she estimates; did not tolerate the other statin either Lab Results  Component Value Date   CHOL 185 03/11/2017   CHOL 153 09/10/2016   CHOL 162 09/29/2015   Lab Results  Component Value Date   HDL 34 (L) 03/11/2017   HDL 28 (L) 09/10/2016   HDL 36 (L) 09/29/2015   Lab Results  Component Value Date   LDLCALC 109 (H) 03/11/2017   LDLCALC 79 09/10/2016   LDLCALC 76 09/29/2015   Lab Results  Component Value Date   TRIG 209 (H) 03/11/2017   TRIG 231 (H) 09/10/2016   TRIG 250 (H) 09/29/2015   Lab Results  Component Value Date   CHOLHDL 5.4 (H) 03/11/2017   CHOLHDL 5.5 (H) 09/10/2016   No results found for: LDLDIRECT   Review of Systems  Past Medical History:  Diagnosis Date  . Allergy   . Cancer ([place]) 2009   left breast, T1, N0, M0. ER/PR positive, HER2 neu positive  . Dental bridge present    permanant - upper  . Personal history of tobacco use, presenting hazards to health   . Tachycardia     Past Surgical History:  Procedure Laterality Date  .  BREAST BIOPSY Left 2009  . BREAST CYST ASPIRATION Bilateral 2005   neg  . BREAST LUMPECTOMY Left 2009   positive  . BREAST SURGERY Left 2009   lumpectomy  . CATARACT EXTRACTION W/ INTRAOCULAR LENS IMPLANT Bilateral   . COLONOSCOPY  [id number]   Dr. Candace Cruise  . COLONOSCOPY N/A 09/07/2015   Procedure: COLONOSCOPY;  Surgeon: Hulen Luster, MD;  Location: [facility];  Service: Gastroenterology;  Laterality: N/A;  . POLYPECTOMY  09/07/2015   Procedure: POLYPECTOMY;  Surgeon: Hulen Luster, MD;  Location: [facility];  Service: Gastroenterology;;  . PORT-A-CATH REMOVAL  2011  . PORTACATH PLACEMENT  2009  . TONSILLECTOMY      Family History  Problem Relation Age of Onset  . Stroke  Mother   . Heart disease Father   . Diabetes Brother   . Heart disease Paternal Grandmother   . Cancer Neg Hx   . COPD Neg Hx   . Hypertension Neg Hx   . Breast cancer Neg Hx     Social History   Tobacco Use  . Smoking status: Former Smoker    Years: 20.00    Types: Cigarettes    Last attempt to quit: 08/06/1984    Years since quitting: 33.1  . Smokeless tobacco: Never Used  . Tobacco comment: quit in 1986 approximately  Substance Use Topics  . Alcohol use: Yes    Comment: drinks rarely  . Drug use: No    Outpatient Encounter Medications as of 09/11/2017  Medication Sig  . aspirin 81 MG tablet Take 81 mg by mouth daily.  . Biotin 5000 MCG CAPS Take 1 capsule by mouth daily.  . Calcium Carbonate (CALCIUM 600 PO) Take 1 tablet by mouth every other day.   . Cholecalciferol (VITAMIN D PO) Take 1,000 Int'l Units by mouth daily.  . Cyanocobalamin (VITAMIN B 12 PO) Take 500 mg by mouth daily.  . folic acid (FOLVITE) 664 MCG tablet Take 400 mcg by mouth daily.  Marland Kitchen letrozole (FEMARA) 2.5 MG tablet Take 1 tablet (2.5 mg total) by mouth daily.  . Magnesium 250 MG TABS Take 1 tablet by mouth daily.  . metoprolol succinate (TOPROL-XL) 50 MG 24 hr tablet Take 1 tablet (50 mg total) by mouth daily.  . Multiple Vitamin (MULTIVITAMIN) tablet Take 1 tablet by mouth daily.  . Omega-3 Fatty Acids (FISH OIL) 1000 MG CAPS Take 1 capsule by mouth every other day.   . Potassium Gluconate 595 MG CAPS Take by mouth daily.  Marland Kitchen triamcinolone cream (KENALOG) 0.1 % Apply 1 application topically 2 (two) times daily. (Patient taking differently: Apply 1 application topically as needed. )  . vitamin C (ASCORBIC ACID) 500 MG tablet Take 500 mg by mouth every other day.   . [DISCONTINUED] lovastatin (MEVACOR) 40 MG tablet Take by mouth.  . [DISCONTINUED] SHINGRIX injection    No facility-administered encounter medications on file as of 09/11/2017.     Functional Ability / Safety Screening 1.  Was the timed Get  Up and Go test less than 12 seconds?  yes 2.  Does the patient need help with the phone, transportation, shopping,      preparing meals, housework, laundry, medications, or managing money?  no 3.  Does the patient's home have:  loose throw rugs in the hallway?   yes, loose rugs on top of carpet      Grab bars in the bathroom? yes      Handrails on the stairs?  yes      Good lighting?   yes 4.  Has the patient noticed any hearing difficulties?   no  Advanced Directives Does patient have a HCPOA?    yes If yes, name and contact information: husband, Aela, Bohan.; [phone] Does patient have a living will or MOST form?  yes  Discussion on code issues; she wishes to be a full code at this time in current state of health; in the future, should condition worsen or terminal, would not be kept alive on machines Immunizations:   [place] 09/11/2017  What Year? 0 points  What month? 0 points  What time? 0 points  Count back from 20 0 points  Months in reverse 4 points  Repeat phrase 0 points  Total Score 4   Serial 7s; 100 93 86 79 62...  Fall Risk Assessment See under rooming  Depression Screen See under rooming Depression screen [facility] 2/9 09/11/2017 05/07/2017 03/11/2017 09/10/2016 09/16/2015  Decreased Interest 0 0 0 0 0  Down, Depressed, Hopeless 0 0 0 0 0  PHQ - 2 Score 0 0 0 0 0    Objective:   Vitals: BP 136/78   Pulse 78   Temp 97.8 F (36.6 C) (Oral)   Resp 14   Ht 5' 5.38" (1.661 m)   Wt 164 lb 6.4 oz (74.6 kg)   SpO2 96%   BMI 27.04 kg/m  Body mass index is 27.04 kg/m. No exam data present  Physical Exam Mood/affect:  Euthymic, pleasant Appearance:  Neatly dressed, good hygiene, appears younger than stated age  6CIT Screen 09/11/2017  What Year? 0 points  What month? 0 points  What time? 0 points  Count back from 20 0 points  Months in reverse 4 points  Repeat phrase 0 points  Total Score 4    Assessment & Plan:     Annual Wellness  Visit  Reviewed patient's Family Medical History Reviewed and updated list of patient's medical providers Assessment of cognitive impairment was done Assessed patient's functional ability Established a written schedule for health screening [place] Completed and Reviewed  Exercise Activities and Dietary recommendations Goals    . DIET - EAT MORE FRUITS AND VEGETABLES (pt-stated)     Five a day       Immunization History  Administered Date(s) Administered  . Influenza-Unspecified 04/09/2015, 04/11/2016  . Pneumococcal Conjugate-13 03/02/2014  . Pneumococcal Polysaccharide-23 05/01/2005  . Td 08/07/2003  . Tdap 08/25/2013  . Zoster 05/16/2006  . Zoster Recombinat (Shingrix) 06/07/2017, 09/07/2017    Health Maintenance  Topic Date Due  . DEXA SCAN  05/30/2018  . MAMMOGRAM  06/03/2018  . TETANUS/TDAP  08/26/2023  . INFLUENZA VACCINE  Completed  . PNA vac Low Risk Adult  Completed    Discussed health benefits of physical activity, and encouraged her to engage in regular exercise appropriate for her age and condition.   No orders of the defined types were placed in this encounter.   Current Outpatient Medications:  .  aspirin 81 MG tablet, Take 81 mg by mouth daily., Disp: , Rfl:  .  Biotin 5000 MCG CAPS, Take 1 capsule by mouth daily., Disp: , Rfl:  .  Calcium Carbonate (CALCIUM 600 PO), Take 1 tablet by mouth every other day. , Disp: , Rfl:  .  Cholecalciferol (VITAMIN D PO), Take 1,000 Int'l Units by mouth daily., Disp: , Rfl:  .  Cyanocobalamin (VITAMIN B 12 PO), Take 500 mg by  mouth daily., Disp: , Rfl:  .  folic acid (FOLVITE) 408 MCG tablet, Take 400 mcg by mouth daily., Disp: , Rfl:  .  letrozole (FEMARA) 2.5 MG tablet, Take 1 tablet (2.5 mg total) by mouth daily., Disp: 90 tablet, Rfl: 0 .  Magnesium 250 MG TABS, Take 1 tablet by mouth daily., Disp: , Rfl:  .  metoprolol succinate (TOPROL-XL) 50 MG 24 hr tablet, Take 1 tablet (50 mg total)  by mouth daily., Disp: 30 tablet, Rfl: 9 .  Multiple Vitamin (MULTIVITAMIN) tablet, Take 1 tablet by mouth daily., Disp: , Rfl:  .  Omega-3 Fatty Acids (FISH OIL) 1000 MG CAPS, Take 1 capsule by mouth every other day. , Disp: , Rfl:  .  Potassium Gluconate 595 MG CAPS, Take by mouth daily., Disp: , Rfl:  .  triamcinolone cream (KENALOG) 0.1 %, Apply 1 application topically 2 (two) times daily. (Patient taking differently: Apply 1 application topically as needed. ), Disp: 30 g, Rfl: 1 .  vitamin C (ASCORBIC ACID) 500 MG tablet, Take 500 mg by mouth every other day. , Disp: , Rfl:  .  DOCOSAHEXAENOIC ACID PO, Take by mouth., Disp: , Rfl:  .  omeprazole (PRILOSEC) 20 MG capsule, Take by mouth., Disp: , Rfl:  .  Potassium 99 MG TABS, Take by mouth., Disp: , Rfl:  Medications Discontinued During This Encounter  Medication Reason  . SHINGRIX injection Completed Course  . lovastatin (MEVACOR) 40 MG tablet Completed Course    Next Medicare Wellness Visit in 12+ months  Problem List Items Addressed This Visit      Other   Hypertriglyceridemia   Hyperlipidemia LDL goal <100   Relevant Orders   Lipid panel   Medicare annual wellness visit, subsequent - Primary    USPSTF grade A and B recommendations reviewed with patient; age-appropriate recommendations, preventive care, screening tests, etc discussed and encouraged; healthy living encouraged; see AVS for patient education given to patient       Colon cancer screening    Reviewed colonoscopy and path report; next screening in 2022; long life expectancy and will continue cologuard until age 57      Breast cancer of upper-inner quadrant of left female breast ([place])    Managed by surgeon and oncologist       Other Visit Diagnoses    Full code status       Relevant Orders   Full code   Advanced care planning/counseling discussion

## 2017-09-11 NOTE — Telephone Encounter (Signed)
Called pt we clarified together that she is no longer taking Prilosec or Docosahexaenoic. Pt also questions if she should be taking a cholesterol medication. She states that the one she was on caused her joint pain but she believes that you discussed her taking one a few days a week today but cannot remember. Please advise. Thanks!

## 2017-09-11 NOTE — Telephone Encounter (Signed)
Copied from [place]. Topic: Quick Communication - See Telephone Encounter >> Sep 11, 2017  2:47 PM Arletha Grippe wrote: CRM for notification. See Telephone encounter for:   09/11/17. Pt called asking about prilosec and DOCOSAHEXAENOIC ACID PO. They are on her medication list and pt does not remember taking this medication.  She states that she has not been taking the prilosec.  If applicable please call into [facility]..please call pt at [phone] for clarification, as pt had a hard time remembering all the info from today.

## 2017-09-12 MED ORDER — ATORVASTATIN CALCIUM 10 MG PO TABS: ORAL_TABLET | ORAL | 2 refills | Status: DC

## 2017-09-12 NOTE — Telephone Encounter (Signed)
Okay, I reviewed her cholesterol panel and her cholesterol is higher than we want I'm going to suggest that she start the lowest dose of atorvastatin (her other meds were lovastatin and rosuvastatin) Have her take it just three nights a week, Monday, Wednesday, and Friday for example If that causes any aches, then drop it back to two nights a week or even just one night a week A little is better than nothing Let's recheck her cholesterol in 3 months Obviously, I'd much rather do dietary changes for all of my patients if that would be enough Avoid foods that come from cows and pigs, and go vegetarian or vegan if she truly doesn't want to take any medicine at all; a vegan diet has been shown to lower cholesterol without medicines Please order lipids for 3 months (have her come fasting since her TG were high) Thank you

## 2017-09-13 NOTE — Telephone Encounter (Signed)
Called pt informed her of the message below per Dr.Lada. Pt gave verbal understanding and states that she will pick up medication today. Labs ordered.

## 2017-09-23 NOTE — Progress Notes (Signed)
Signing off on lab document

## 2017-11-05 NOTE — Progress Notes (Signed)
[facility] day:  11/06/2017  Chief Complaint: Toni Callahan is a 77 y.o. adult with stage I Her2/neu+ left breast cancer who is seen for 6 month assessment.  HPI:  The patient was last seen in the medical oncology clinic on 05/08/2017.  At that time, patient was doing well. She had no acute concerns. She continued on her Femara as prescribed with no perceived side effects. Exam and labs unremarkable.   Patient had screening mammogram done on 06/03/2017 that demonstrated no mammographic evidence of malignancy.   Patient was seen in post surgical follow up on 06/09/2017 by Dr. Jamal Collin.  Patient was noted to be stable.  She was scheduled to follow-up in 1 year with bilateral screening mammograms with Dr. Bary Castilla.   In the interim, patient has been doing well. She states, "everything has been the same". She has no acute concerns today. Patient verbalizes no concerns with regards to her breasts. Patient has no B symptoms. She has not had any recent infections.   Patient eating well. Her weight is stable. Patient denies pain in the clinic today.   Patient performs monthly self breast examinations as recommended.    Past Medical History:  Diagnosis Date  . Allergy   . Cancer ([facility]) 2009   left breast, T1, N0, M0. ER/PR positive, HER2 neu positive  . Dental bridge present    permanant - upper  . Personal history of tobacco use, presenting hazards to health   . Tachycardia     Past Surgical History:  Procedure Laterality Date  . BREAST BIOPSY Left 2009  . BREAST CYST ASPIRATION Bilateral 2005   neg  . BREAST LUMPECTOMY Left 2009   positive  . BREAST SURGERY Left 2009   lumpectomy  . CATARACT EXTRACTION W/ INTRAOCULAR LENS IMPLANT Bilateral   . COLONOSCOPY  [id number]   Dr. Candace Cruise  . COLONOSCOPY N/A 09/07/2015   Procedure: COLONOSCOPY;  Surgeon: Hulen Luster, MD;  Location: [facility];  Service: Gastroenterology;  Laterality: N/A;  .  POLYPECTOMY  09/07/2015   Procedure: POLYPECTOMY;  Surgeon: Hulen Luster, MD;  Location: [facility];  Service: Gastroenterology;;  . PORT-A-CATH REMOVAL  2011  . PORTACATH PLACEMENT  2009  . TONSILLECTOMY      Family History  Problem Relation Age of Onset  . Stroke Mother   . Heart disease Father   . Diabetes Brother   . Heart disease Paternal Grandmother   . Cancer Neg Hx   . COPD Neg Hx   . Hypertension Neg Hx   . Breast cancer Neg Hx     Social History:  reports that she quit smoking about 33 years ago. Her smoking use included cigarettes. She quit after 20.00 years of use. She has never used smokeless tobacco. She reports that she drinks alcohol. She reports that she does not use drugs.  She has lived in the area 10 years.  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  She lives in [place].  The patient is alone today.  Allergies:  Allergies  Allergen Reactions  . Sulfa Antibiotics Nausea Only and Other (See Comments)    Upset stomach    Current Medications: Current Outpatient Medications  Medication Sig Dispense Refill  . aspirin 81 MG tablet Take 81 mg by mouth daily.    Marland Kitchen atorvastatin (LIPITOR) 10 MG tablet One by mouth at bedtime three nights a week if tolerated; drop back to two nights  or one night a week if needed 12 tablet 2  . Biotin 5000 MCG CAPS Take 1 capsule by mouth daily.    . Calcium Carbonate (CALCIUM 600 PO) Take 1 tablet by mouth every other day.     . Cholecalciferol (VITAMIN D PO) Take 1,000 Int'l Units by mouth daily.    . Cyanocobalamin (VITAMIN B 12 PO) Take 500 mg by mouth daily.    . folic acid (FOLVITE) 889 MCG tablet Take 400 mcg by mouth daily.    Marland Kitchen letrozole (FEMARA) 2.5 MG tablet Take 1 tablet (2.5 mg total) by mouth daily. 90 tablet 0  . Magnesium 250 MG TABS Take 1 tablet by mouth daily.    . metoprolol succinate (TOPROL-XL) 50 MG 24 hr tablet Take 1 tablet (50 mg total) by mouth daily. 30 tablet 9  . Multiple Vitamin (MULTIVITAMIN)  tablet Take 1 tablet by mouth daily.    . Omega-3 Fatty Acids (FISH OIL) 1000 MG CAPS Take 1 capsule by mouth every other day.     . Potassium Gluconate 595 MG CAPS Take by mouth daily.    . vitamin C (ASCORBIC ACID) 500 MG tablet Take 500 mg by mouth every other day.     . Potassium 99 MG TABS Take by mouth.     No current facility-administered medications for this visit.     Review of Systems:  GENERAL:  Feels good.  Active.  No fevers or sweats. Weight stable. PERFORMANCE STATUS (ECOG):  0 HEENT:  Allergies. No visual changes, runny nose, sore throat, mouth sores or tenderness. Lungs: No shortness of breath or cough.  No hemoptysis. Cardiac:  No chest pain, palpitations, orthopnea, or PND. GI:  No nausea, vomiting, diarrhea, constipation, melena or hematochezia.  Colonoscopy 09/07/2015. GU:  No urgency, frequency, dysuria, or hematuria. Musculoskeletal:  No back pain.  No joint pain.  No muscle tenderness. Extremities:  No pain or swelling. Skin:  No rashes or skin changes. Neuro:  No headache, numbness or weakness, balance or coordination issues. Endocrine:  No diabetes, thyroid issues, hot flashes or night sweats. Psych:  No mood changes, depression or anxiety. Pain:  No focal pain. Review of systems:  All other systems reviewed and found to be negative.  Physical Exam: Blood pressure 132/81, pulse 72, temperature 97.7 F (36.5 C), temperature source Tympanic, weight 164 lb 3.9 oz (74.5 kg). GENERAL:  Well developed, well nourished, woman sitting comfortably in the exam room in no acute distress. MENTAL STATUS:  Alert and oriented to person, place and time. HEAD:  Short gray hair.  Normocephalic, atraumatic, face symmetric, no Cushingoid features. EYES:  Glasses.  Blue eyes.  Eyes injected.  Pupils equal round and reactive to light and accomodation.  Noscleral icterus. ENT:  Oropharynx clear without lesion.  Tongue normal. Mucous membranes moist.  RESPIRATORY:  Clear to  auscultation without rales, wheezes or rhonchi. CARDIOVASCULAR:  Regular rate and rhythm without murmur, rub or gallop. BREAST:  Right breast with fibrocystic changes superiorly.  No discrete masses, skin changes or nipple discharge.  Left breast with well healed incision at 9 o'clock position.  Scarring at end of incision.  No masses, skin changes or nipple discharge.  ABDOMEN:  Soft, non-tender, with active bowel sounds, and no hepatosplenomegaly.  No masses. SKIN:  No rashes, ulcers or lesions. EXTREMITIES: No edema, no skin discoloration or tenderness.  No palpable cords. LYMPH NODES: No palpable cervical, supraclavicular, axillary or inguinal adenopathy  NEUROLOGICAL: Unremarkable. PSYCH:  Appropriate.  Appointment on 11/06/2017  Component Date Value Ref Range Status  . Sodium 11/06/2017 136  135 - 145 mmol/L Final  . Potassium 11/06/2017 4.1  3.5 - 5.1 mmol/L Final  . Chloride 11/06/2017 103  101 - 111 mmol/L Final  . CO2 11/06/2017 26  22 - 32 mmol/L Final  . Glucose, Bld 11/06/2017 94  65 - 99 mg/dL Final  . BUN 11/06/2017 19  6 - 20 mg/dL Final  . Creatinine, Ser 11/06/2017 0.66  0.44 - 1.00 mg/dL Final  . Calcium 11/06/2017 8.9  8.9 - 10.3 mg/dL Final  . Total Protein 11/06/2017 7.4  6.5 - 8.1 g/dL Final  . Albumin 11/06/2017 3.9  3.5 - 5.0 g/dL Final  . AST 11/06/2017 23  15 - 41 U/L Final  . ALT 11/06/2017 20  14 - 54 U/L Final  . Alkaline Phosphatase 11/06/2017 55  38 - 126 U/L Final  . Total Bilirubin 11/06/2017 0.6  0.3 - 1.2 mg/dL Final  . GFR calc non Af Amer 11/06/2017 >60  >60 mL/min Final  . GFR calc Af Amer 11/06/2017 >60  >60 mL/min Final   Comment: (NOTE) The eGFR has been calculated using the CKD EPI equation. This calculation has not been validated in all clinical situations. eGFR's persistently <60 mL/min signify possible Chronic Kidney Disease.   Georgiann Hahn gap 11/06/2017 7  5 - 15 Final   Performed at [facility], [address]  [place], [place] [address]  . WBC 11/06/2017 7.4  3.6 - 11.0 K/uL Final  . RBC 11/06/2017 4.52  3.80 - 5.20 MIL/uL Final  . Hemoglobin 11/06/2017 14.1  12.0 - 16.0 g/dL Final  . HCT 11/06/2017 41.3  35.0 - 47.0 % Final  . MCV 11/06/2017 91.4  80.0 - 100.0 fL Final  . MCH 11/06/2017 31.3  26.0 - 34.0 pg Final  . MCHC 11/06/2017 34.2  32.0 - 36.0 g/dL Final  . RDW 11/06/2017 12.8  11.5 - 14.5 % Final  . Platelets 11/06/2017 238  150 - 440 K/uL Final  . Neutrophils Relative % 11/06/2017 68  % Final  . Neutro Abs 11/06/2017 5.1  1.4 - 6.5 K/uL Final  . Lymphocytes Relative 11/06/2017 25  % Final  . Lymphs Abs 11/06/2017 1.8  1.0 - 3.6 K/uL Final  . Monocytes Relative 11/06/2017 6  % Final  . Monocytes Absolute 11/06/2017 0.4  0.2 - 0.9 K/uL Final  . Eosinophils Relative 11/06/2017 0  % Final  . Eosinophils Absolute 11/06/2017 0.0  0 - 0.7 K/uL Final  . Basophils Relative 11/06/2017 1  % Final  . Basophils Absolute 11/06/2017 0.0  0 - 0.1 K/uL Final   Performed at [facility], [address], [place], [place] [address]    Assessment:  Toni Callahan is a 77 y.o. adult with stage I Her2/neu+ left breast cancer s/p lumpectomy and sentinel lymph node biopsy on  02/19/2008.  No pathology is available. Notes indicate pathologic stage [id number].  Tumor was ER positive, PR positive and HER-2/neu 3+.  BCI testing on 07/06/2015 revealed a high risk of late recurrence 8.1% (CI 3.3%-12.6%) at years 5-10 with a high likelihood of benefit of extended adjuvant hormonal therapy.  She received 6 cycles of Taxotere, carboplatin, and Herceptin (Foot of Ten)  6 cycles (03/16/2008 - 06/29/2008). She received Herceptin every 3 weeks (last cycle 02/15/2009).  She received radiation. She began letrozole (Femara).  She was off therapy for only about 2-3 months.  CA27.29  has been followed: 29.4 on 07/11/2010, 21.2 on 07/24/2011, 23.1 on 02/06/2012, 21.3 on 03/20/2013, 26.0 on 04/30/2014, 40.6 on 04/28/2015, 32.6 on  06/21/2015, 34.7 on 10/29/2016, and 28.5 on 05/08/2017.  Bilateral diagnostic mammogram on 05/30/2016 revealed no evidence of malignancy (ordered by Dr. Jamal Collin). Bilateral screening mammogram on 06/03/2018  demonstrated no mammographic evidence of malignancy.   Bone density on 05/19/2014 was normal with a T score of 0.3 in the left femur.  Bone density on 05/30/2016 was normal with a T-score of -0.1 in the AP spine (L1-L2).  Symptomatically, she denies any concerns. Exam is stable. Breasts reveal fibrocystic changes (RIGHT) and post-surgical scarring (LEFT). Labs are unremarkable.   Plan: 1.  Labs today:  CBC with diff, CMP, CA27.29. 2.  Continue Femara.  Obtain radiation records to determine start date of Femara. 3.  Continue to follow-up with surgery as already scheduled.  Patient will be seeing Dr. Bary Castilla following the retirement of Dr. Jamal Collin.   4.  Anticipate mammogram in 04/2018 (ordered by surgery). 5.  Bone density 05/30/2018. 6.  Continue calcium 1200 mg a day and vitamin D 800 IU a day. 7.  RTC in 6 months for MD assessment, labs (CBC with diff, CMP, CA27.29).   Honor Loh, NP  11/06/2017, 8:59 AM   I saw and evaluated the patient, participating in the key portions of the service and reviewing pertinent diagnostic studies and records.  I reviewed the nurse practitioner's note and agree with the findings and the plan.  A few questions were asked by the patient and answered.   Nolon Stalls, MD 11/06/2017,8:59 AM

## 2017-11-06 ENCOUNTER — Inpatient Hospital Stay: Payer: Medicare Other | Attending: Hematology and Oncology | Admitting: Hematology and Oncology

## 2017-11-06 ENCOUNTER — Inpatient Hospital Stay: Payer: Medicare Other

## 2017-11-06 VITALS — BP 132/81 | HR 72 | Temp 97.7°F | Wt 164.2 lb

## 2017-11-06 DIAGNOSIS — Z87891 Personal history of nicotine dependence: Secondary | ICD-10-CM | POA: Insufficient documentation

## 2017-11-06 DIAGNOSIS — Z170 Estrogen receptor positive status [ER+]: Secondary | ICD-10-CM | POA: Diagnosis not present

## 2017-11-06 DIAGNOSIS — Z9221 Personal history of antineoplastic chemotherapy: Secondary | ICD-10-CM

## 2017-11-06 DIAGNOSIS — Z7982 Long term (current) use of aspirin: Secondary | ICD-10-CM

## 2017-11-06 DIAGNOSIS — C50212 Malignant neoplasm of upper-inner quadrant of left female breast: Secondary | ICD-10-CM | POA: Diagnosis not present

## 2017-11-06 DIAGNOSIS — Z853 Personal history of malignant neoplasm of breast: Secondary | ICD-10-CM

## 2017-11-06 DIAGNOSIS — Z79899 Other long term (current) drug therapy: Secondary | ICD-10-CM

## 2017-11-06 DIAGNOSIS — Z882 Allergy status to sulfonamides status: Secondary | ICD-10-CM | POA: Diagnosis not present

## 2017-11-06 DIAGNOSIS — Z79811 Long term (current) use of aromatase inhibitors: Secondary | ICD-10-CM | POA: Diagnosis not present

## 2017-11-06 DIAGNOSIS — Z923 Personal history of irradiation: Secondary | ICD-10-CM | POA: Insufficient documentation

## 2017-11-06 DIAGNOSIS — Z13820 Encounter for screening for osteoporosis: Secondary | ICD-10-CM

## 2017-11-06 LAB — CBC WITH DIFFERENTIAL/PLATELET
Basophils Absolute: 0 10*3/uL (ref 0–0.1)
Basophils Relative: 1 %
Eosinophils Absolute: 0 10*3/uL (ref 0–0.7)
Eosinophils Relative: 0 %
HCT: 41.3 % (ref 35.0–47.0)
Hemoglobin: 14.1 g/dL (ref 12.0–16.0)
Lymphocytes Relative: 25 %
Lymphs Abs: 1.8 10*3/uL (ref 1.0–3.6)
MCH: 31.3 pg (ref 26.0–34.0)
MCHC: 34.2 g/dL (ref 32.0–36.0)
MCV: 91.4 fL (ref 80.0–100.0)
Monocytes Absolute: 0.4 10*3/uL (ref 0.2–0.9)
Monocytes Relative: 6 %
Neutro Abs: 5.1 10*3/uL (ref 1.4–6.5)
Neutrophils Relative %: 68 %
Platelets: 238 10*3/uL (ref 150–440)
RBC: 4.52 MIL/uL (ref 3.80–5.20)
RDW: 12.8 % (ref 11.5–14.5)
WBC: 7.4 10*3/uL (ref 3.6–11.0)

## 2017-11-06 LAB — COMPREHENSIVE METABOLIC PANEL
ALT: 20 U/L (ref 14–54)
AST: 23 U/L (ref 15–41)
Albumin: 3.9 g/dL (ref 3.5–5.0)
Alkaline Phosphatase: 55 U/L (ref 38–126)
Anion gap: 7 (ref 5–15)
BUN: 19 mg/dL (ref 6–20)
CO2: 26 mmol/L (ref 22–32)
Calcium: 8.9 mg/dL (ref 8.9–10.3)
Chloride: 103 mmol/L (ref 101–111)
Creatinine, Ser: 0.66 mg/dL (ref 0.44–1.00)
GFR calc Af Amer: >60 mL/min (ref >60)
GFR calc non Af Amer: >60 mL/min (ref >60)
Glucose, Bld: 94 mg/dL (ref 65–99)
Potassium: 4.1 mmol/L (ref 3.5–5.1)
Sodium: 136 mmol/L (ref 135–145)
Total Bilirubin: 0.6 mg/dL (ref 0.3–1.2)
Total Protein: 7.4 g/dL (ref 6.5–8.1)

## 2017-11-06 NOTE — Progress Notes (Signed)
Patient offers no complaints today. 

## 2017-11-07 LAB — CA 27.29 (SERIAL MONITOR): CA 27.29: 37.8 U/mL (ref 0.0–38.6)

## 2017-11-09 ENCOUNTER — Encounter: Payer: Self-pay | Admitting: Hematology and Oncology

## 2017-12-27 ENCOUNTER — Other Ambulatory Visit: Payer: Self-pay | Admitting: Family Medicine

## 2018-01-20 ENCOUNTER — Other Ambulatory Visit: Payer: Self-pay | Admitting: Family Medicine

## 2018-01-20 NOTE — Telephone Encounter (Signed)
Please ask pt to have her lipid panel done

## 2018-01-20 NOTE — Telephone Encounter (Signed)
Left detailed voicemail

## 2018-02-15 ENCOUNTER — Other Ambulatory Visit: Payer: Self-pay | Admitting: Hematology and Oncology

## 2018-04-05 ENCOUNTER — Other Ambulatory Visit: Payer: Self-pay | Admitting: Family Medicine

## 2018-04-08 ENCOUNTER — Other Ambulatory Visit: Payer: Self-pay

## 2018-04-08 MED ORDER — ATORVASTATIN CALCIUM 10 MG PO TABS: ORAL_TABLET | ORAL | 0 refills | Status: DC

## 2018-04-08 NOTE — Telephone Encounter (Signed)
Last 4/3

## 2018-04-10 ENCOUNTER — Other Ambulatory Visit: Payer: Self-pay | Admitting: Hematology and Oncology

## 2018-04-10 DIAGNOSIS — Z1231 Encounter for screening mammogram for malignant neoplasm of breast: Secondary | ICD-10-CM

## 2018-05-07 ENCOUNTER — Other Ambulatory Visit: Payer: Medicare Other

## 2018-05-07 ENCOUNTER — Ambulatory Visit: Payer: Medicare Other | Admitting: Hematology and Oncology

## 2018-05-29 ENCOUNTER — Other Ambulatory Visit: Payer: Self-pay | Admitting: Urgent Care

## 2018-06-03 ENCOUNTER — Other Ambulatory Visit: Payer: Medicare Other

## 2018-06-09 ENCOUNTER — Ambulatory Visit: Payer: Medicare Other

## 2018-06-10 ENCOUNTER — Ambulatory Visit: Payer: Medicare Other | Admitting: General Surgery

## 2018-06-11 ENCOUNTER — Ambulatory Visit
Admission: RE | Admit: 2018-06-11 | Discharge: 2018-06-11 | Disposition: A | Discharge status: Home / Self Care | Payer: Medicare Other | Source: Ambulatory Visit | Attending: Hematology and Oncology | Admitting: Hematology and Oncology

## 2018-06-11 ENCOUNTER — Ambulatory Visit
Admission: RE | Admit: 2018-06-11 | Discharge: 2018-06-11 | Disposition: A | Discharge status: Home / Self Care | Payer: Medicare Other | Source: Ambulatory Visit | Attending: Urgent Care | Admitting: Urgent Care

## 2018-06-11 DIAGNOSIS — Z79811 Long term (current) use of aromatase inhibitors: Secondary | ICD-10-CM | POA: Insufficient documentation

## 2018-06-11 DIAGNOSIS — Z13820 Encounter for screening for osteoporosis: Secondary | ICD-10-CM | POA: Insufficient documentation

## 2018-06-11 DIAGNOSIS — Z780 Asymptomatic menopausal state: Secondary | ICD-10-CM | POA: Diagnosis not present

## 2018-06-11 DIAGNOSIS — Z1231 Encounter for screening mammogram for malignant neoplasm of breast: Secondary | ICD-10-CM | POA: Insufficient documentation

## 2018-06-11 DIAGNOSIS — M85832 Other specified disorders of bone density and structure, left forearm: Secondary | ICD-10-CM | POA: Diagnosis not present

## 2018-06-12 ENCOUNTER — Other Ambulatory Visit: Payer: Self-pay

## 2018-06-12 ENCOUNTER — Ambulatory Visit (INDEPENDENT_AMBULATORY_CARE_PROVIDER_SITE_OTHER): Payer: Medicare Other | Admitting: General Surgery

## 2018-06-12 ENCOUNTER — Encounter: Payer: Self-pay | Admitting: General Surgery

## 2018-06-12 VITALS — BP 145/83 | HR 79 | Resp 13 | Ht 66.0 in | Wt 163.0 lb

## 2018-06-12 DIAGNOSIS — C50212 Malignant neoplasm of upper-inner quadrant of left female breast: Secondary | ICD-10-CM | POA: Diagnosis not present

## 2018-06-12 NOTE — Progress Notes (Signed)
Patient ID: Toni Callahan, adult   DOB: Aug 08, 1940, 77 y.o.   MRN: [id number]  Chief Complaint  Patient presents with  . Follow-up    HPI Toni Callahan is a 77 y.o. 77 y.o. adult who presents for a breast evaluation. The most recent mammogram was done on 06/11/2018 .  Patient does perform regular self breast checks and gets regular mammograms done.    HPI  Past Medical History:  Diagnosis Date  . Allergy   . Breast cancer ([facility]) 2009   left /chemo/rad  . Cancer ([facility]) 2009   left breast, T1, N0, M0. ER/PR positive, HER2 neu positive  . Dental bridge present    permanant - upper  . Personal history of chemotherapy   . Personal history of radiation therapy   . Personal history of tobacco use, presenting hazards to health   . Tachycardia     Past Surgical History:  Procedure Laterality Date  . BREAST BIOPSY Left 2009   positive  . BREAST CYST ASPIRATION Bilateral 2005   neg  . BREAST LUMPECTOMY Left 2009   positive  . BREAST SURGERY Left 2009   lumpectomy  . CATARACT EXTRACTION W/ INTRAOCULAR LENS IMPLANT Bilateral   . COLONOSCOPY  [id number]   Dr. Bluford Kaufmann  . COLONOSCOPY N/A 09/07/2015   Procedure: COLONOSCOPY;  Surgeon: Wallace Cullens, MD;  Location: [facility];  Service: Gastroenterology;  Laterality: N/A;  . POLYPECTOMY  09/07/2015   Procedure: POLYPECTOMY;  Surgeon: Wallace Cullens, MD;  Location: [facility];  Service: Gastroenterology;;  . PORT-A-CATH REMOVAL  2011  . PORTACATH PLACEMENT  2009  . TONSILLECTOMY      Family History  Problem Relation Age of Onset  . Stroke Mother   . Heart disease Father   . Diabetes Brother   . Heart disease Paternal Grandmother   . Cancer Neg Hx   . COPD Neg Hx   . Hypertension Neg Hx   . Breast cancer Neg Hx     Social History Social History   Tobacco Use  . Smoking status: Former Smoker    Years: 20.00    Types: Cigarettes    Last attempt to quit: 08/06/1984    Years since quitting: 33.8  . Smokeless tobacco: Never Used   . Tobacco comment: quit in 1986 approximately  Substance Use Topics  . Alcohol use: Yes    Comment: drinks rarely  . Drug use: No    Allergies  Allergen Reactions  . Sulfa Antibiotics Nausea Only and Other (See Comments)    Upset stomach    Current Outpatient Medications  Medication Sig Dispense Refill  . aspirin 81 MG tablet Take 81 mg by mouth daily.    Marland Kitchen atorvastatin (LIPITOR) 10 MG tablet One by mouth at bedtime three nights a week if tolerated; drop back to two nights or one night a week if needed 12 tablet 0  . Biotin 5000 MCG CAPS Take 1 capsule by mouth daily.    . Calcium Carbonate (CALCIUM 600 PO) Take 1 tablet by mouth every other day.     . Cholecalciferol (VITAMIN D PO) Take 1,000 Int'l Units by mouth daily.    . Cyanocobalamin (VITAMIN B 12 PO) Take 500 mg by mouth daily.    . folic acid (FOLVITE) 400 MCG tablet Take 400 mcg by mouth daily.    Marland Kitchen letrozole (FEMARA) 2.5 MG tablet Take 1 tablet (2.5 mg total) by mouth daily. 90 tablet 0  . Magnesium 250 MG  TABS Take 1 tablet by mouth daily.    . metoprolol succinate (TOPROL-XL) 50 MG 24 hr tablet Take 1 tablet (50 mg total) by mouth daily. 30 tablet 8  . Multiple Vitamin (MULTIVITAMIN) tablet Take 1 tablet by mouth daily.    . Omega-3 Fatty Acids (FISH OIL) 1000 MG CAPS Take 1 capsule by mouth every other day.     . Potassium 99 MG TABS Take by mouth.    . Potassium Gluconate 595 MG CAPS Take by mouth daily.    . vitamin C (ASCORBIC ACID) 500 MG tablet Take 500 mg by mouth every other day.      No current facility-administered medications for this visit.     Review of Systems Review of Systems  Constitutional: Negative.   Respiratory: Negative.   Cardiovascular: Negative.     Blood pressure (!) 145/83, pulse 79, resp. rate 13, height 5\' 6"  (1.676 m), weight 163 lb (73.9 kg).  Physical Exam Physical Exam  Constitutional: She is oriented to person, place, and time. She appears well-developed and well-nourished.   Eyes: Conjunctivae are normal. No scleral icterus.  Neck: Neck supple.  Cardiovascular: Normal rate, regular rhythm and normal heart sounds.  Pulmonary/Chest: Effort normal and breath sounds normal. Right breast exhibits no inverted nipple, no mass, no nipple discharge, no skin change and no tenderness. Left breast exhibits no inverted nipple, no mass, no nipple discharge, no skin change and no tenderness.    Lymphadenopathy:    She has no cervical adenopathy.    She has no axillary adenopathy.       Right: No supraclavicular adenopathy present.       Left: No supraclavicular adenopathy present.  Neurological: She is alert and oriented to person, place, and time.  Skin: Skin is warm and dry.    Data Reviewed Bone density June 11, 2018:  AP Spine L1-L2 06/11/2018 79.1 Normal 0.6 1.237 g/cm2 AP Spine L1-L2 05/19/2014 75.0 Normal 0.3 1.214 g/cm2  DualFemur Total Left 06/11/2018 79.1 Normal 0.1 1.023 g/cm2 DualFemur Total Left 05/19/2014 75.0 Normal 0.3 1.043 g/cm2  DualFemur Total Mean 06/11/2018 79.1 Normal 0.4 1.054 g/cm2 DualFemur Total Mean 05/19/2014 75.0 Normal 0.3 1.048 g/cm2  BIRAD-2.   Assessment    No evidence of recurrent disease.    Plan  The patient has been on extended treatment with antiestrogen therapy.  She will complete 10 years of therapy next spring.  Start date was spring/summer 2010 after chemotherapy and radiation.  Final determination of stop date with medical oncology.  Patient will be asked to return to the office in one year with a bilateral screening mammogram. Dr. Merlene Pulling orders. The patient is aware to call back for any questions or concerns.    HPI, Physical Exam, Assessment and Plan have been scribed under the direction and in the presence of Donnalee Curry, MD.  Toni Callahan, CMA I have completed the exam and reviewed the above documentation for accuracy and completeness.  I agree with the above.  Museum/gallery conservator has been used  and any errors in dictation or transcription are unintentional.  Donnalee Curry, M.D., F.A.C.S.   Toni Callahan 06/13/2018, 5:02 AM

## 2018-06-12 NOTE — Patient Instructions (Signed)
Patient will be asked to return to the office in one year with a bilateral screening mammogram. Dr. Mike Gip orders. The patient is aware to call back for any questions or concerns.

## 2018-06-18 ENCOUNTER — Other Ambulatory Visit: Payer: Self-pay

## 2018-06-18 ENCOUNTER — Inpatient Hospital Stay: Payer: Medicare Other

## 2018-06-18 ENCOUNTER — Encounter: Payer: Self-pay | Admitting: Hematology and Oncology

## 2018-06-18 ENCOUNTER — Inpatient Hospital Stay: Payer: Medicare Other | Attending: Hematology and Oncology | Admitting: Hematology and Oncology

## 2018-06-18 VITALS — BP 136/74 | HR 76 | Temp 96.5°F | Resp 18 | Wt 163.5 lb

## 2018-06-18 DIAGNOSIS — M816 Localized osteoporosis [Lequesne]: Secondary | ICD-10-CM

## 2018-06-18 DIAGNOSIS — Z79811 Long term (current) use of aromatase inhibitors: Secondary | ICD-10-CM | POA: Insufficient documentation

## 2018-06-18 DIAGNOSIS — C50212 Malignant neoplasm of upper-inner quadrant of left female breast: Secondary | ICD-10-CM

## 2018-06-18 DIAGNOSIS — Z87891 Personal history of nicotine dependence: Secondary | ICD-10-CM | POA: Diagnosis not present

## 2018-06-18 DIAGNOSIS — Z923 Personal history of irradiation: Secondary | ICD-10-CM | POA: Diagnosis not present

## 2018-06-18 DIAGNOSIS — Z170 Estrogen receptor positive status [ER+]: Secondary | ICD-10-CM | POA: Diagnosis not present

## 2018-06-18 DIAGNOSIS — Z9221 Personal history of antineoplastic chemotherapy: Secondary | ICD-10-CM | POA: Diagnosis not present

## 2018-06-18 DIAGNOSIS — Z7982 Long term (current) use of aspirin: Secondary | ICD-10-CM | POA: Diagnosis not present

## 2018-06-18 DIAGNOSIS — Z79899 Other long term (current) drug therapy: Secondary | ICD-10-CM | POA: Diagnosis not present

## 2018-06-18 LAB — COMPREHENSIVE METABOLIC PANEL
ALT: 20 U/L (ref 0–44)
AST: 25 U/L (ref 15–41)
Albumin: 3.9 g/dL (ref 3.5–5.0)
Alkaline Phosphatase: 53 U/L (ref 38–126)
Anion gap: 8 (ref 5–15)
BUN: 20 mg/dL (ref 8–23)
CO2: 23 mmol/L (ref 22–32)
Calcium: 9 mg/dL (ref 8.9–10.3)
Chloride: 105 mmol/L (ref 98–111)
Creatinine, Ser: 0.73 mg/dL (ref 0.44–1.00)
GFR calc Af Amer: >60 mL/min (ref >60)
GFR calc non Af Amer: >60 mL/min (ref >60)
Glucose, Bld: 101 mg/dL — ABNORMAL HIGH (ref 70–99)
Potassium: 4.4 mmol/L (ref 3.5–5.1)
Sodium: 136 mmol/L (ref 135–145)
Total Bilirubin: 0.5 mg/dL (ref 0.3–1.2)
Total Protein: 7.2 g/dL (ref 6.5–8.1)

## 2018-06-18 LAB — CBC WITH DIFFERENTIAL/PLATELET
Abs Immature Granulocytes: 0.03 10*3/uL (ref 0.00–0.07)
Basophils Absolute: 0 10*3/uL (ref 0.0–0.1)
Basophils Relative: 0 %
Eosinophils Absolute: 0 10*3/uL (ref 0.0–0.5)
Eosinophils Relative: 0 %
HCT: 40.3 % (ref 36.0–46.0)
Hemoglobin: 13.7 g/dL (ref 12.0–15.0)
Immature Granulocytes: 0 %
Lymphocytes Relative: 33 %
Lymphs Abs: 2.4 10*3/uL (ref 0.7–4.0)
MCH: 31.3 pg (ref 26.0–34.0)
MCHC: 34 g/dL (ref 30.0–36.0)
MCV: 92 fL (ref 80.0–100.0)
Monocytes Absolute: 0.4 10*3/uL (ref 0.1–1.0)
Monocytes Relative: 6 %
Neutro Abs: 4.3 10*3/uL (ref 1.7–7.7)
Neutrophils Relative %: 61 %
Platelets: 248 10*3/uL (ref 150–400)
RBC: 4.38 MIL/uL (ref 3.87–5.11)
RDW: 11.9 % (ref 11.5–15.5)
WBC: 7.2 10*3/uL (ref 4.0–10.5)
nRBC: 0 % (ref 0.0–0.2)

## 2018-06-18 NOTE — Progress Notes (Signed)
Here for follow up. Per pt " feeling good -no change "

## 2018-06-18 NOTE — Progress Notes (Signed)
[facility] day:  06/18/2018  Chief Complaint: Toni Callahan is a 77 y.o. adult with stage I Her2/neu+ left breast cancer who is seen for 6 month assessment.  HPI:  The patient was last seen in the medical oncology clinic on 11/06/2017.  At that time, she denied any concerns. Exam was stable. Breasts revealed fibrocystic changes (RIGHT) and post-surgical scarring (LEFT). Labs were unremarkable.   Bilateral screening mammogram on 06/11/2018 revealed no evidence of malignancy.  Bone density on 06/11/2018 revealed osteoporosis with a T-score of -2.6 in the forearm radius.  She saw Dr. Bary Castilla on 06/12/2018.  Notes reviewed.  She was noted to to have started endocrine therapy in the spring/summer of 2010.  During the interim, patient feels "fine". She denies any acute concerns today. Patient denies that she has experienced any B symptoms. She denies any interval infections.  Patient does not verbalize any concerns with regards to her breasts today. Patient performs monthly self breast examinations as recommended.  Patient remains on prescribed letrozole daily.  Patient advises that she maintains an adequate appetite. She is eating well. Weight today is 163 lb 7.5 oz (74.2 kg), which compared to her last visit to the clinic, represents a  1 pound decrease.   Patient denies pain in the clinic today.   Past Medical History:  Diagnosis Date  . Allergy   . Breast cancer ([facility]) 2009   left /chemo/rad  . Cancer ([facility]) 2009   left breast, T1, N0, M0. ER/PR positive, HER2 neu positive  . Dental bridge present    permanant - upper  . Personal history of chemotherapy   . Personal history of radiation therapy   . Personal history of tobacco use, presenting hazards to health   . Tachycardia     Past Surgical History:  Procedure Laterality Date  . BREAST BIOPSY Left 2009   positive  . BREAST CYST ASPIRATION Bilateral 2005   neg  . BREAST  LUMPECTOMY Left 2009   positive  . BREAST SURGERY Left 2009   lumpectomy  . CATARACT EXTRACTION W/ INTRAOCULAR LENS IMPLANT Bilateral   . COLONOSCOPY  [id number]   Dr. Candace Cruise  . COLONOSCOPY N/A 09/07/2015   Procedure: COLONOSCOPY;  Surgeon: Hulen Luster, MD;  Location: [facility];  Service: Gastroenterology;  Laterality: N/A;  . POLYPECTOMY  09/07/2015   Procedure: POLYPECTOMY;  Surgeon: Hulen Luster, MD;  Location: [facility];  Service: Gastroenterology;;  . PORT-A-CATH REMOVAL  2011  . PORTACATH PLACEMENT  2009  . TONSILLECTOMY      Family History  Problem Relation Age of Onset  . Stroke Mother   . Heart disease Father   . Diabetes Brother   . Heart disease Paternal Grandmother   . Cancer Neg Hx   . COPD Neg Hx   . Hypertension Neg Hx   . Breast cancer Neg Hx     Social History:  reports that she quit smoking about 33 years ago. Her smoking use included cigarettes. She quit after 20.00 years of use. She has never used smokeless tobacco. She reports that she drinks alcohol. She reports that she does not use drugs.  She has lived in the area 61 years.  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 She lives in [place].  The patient is alone today.  Allergies:  Allergies  Allergen Reactions  . Sulfa  Antibiotics Nausea Only and Other (See Comments)    Upset stomach    Current Medications: Current Outpatient Medications  Medication Sig Dispense Refill  . aspirin 81 MG tablet Take 81 mg by mouth daily.    Marland Kitchen atorvastatin (LIPITOR) 10 MG tablet One by mouth at bedtime three nights a week if tolerated; drop back to two nights or one night a week if needed 12 tablet 0  . Biotin 5000 MCG CAPS Take 1 capsule by mouth daily.    . Calcium Carbonate (CALCIUM 600 PO) Take 1 tablet by mouth every other day.     . Cholecalciferol (VITAMIN D PO) Take 1,000 Int'l Units by mouth daily.    . Cyanocobalamin (VITAMIN B 12 PO)  Take 500 mg by mouth daily.    . folic acid (FOLVITE) 580 MCG tablet Take 400 mcg by mouth daily.    Marland Kitchen letrozole (FEMARA) 2.5 MG tablet Take 1 tablet (2.5 mg total) by mouth daily. 90 tablet 0  . Magnesium 250 MG TABS Take 1 tablet by mouth daily.    . metoprolol succinate (TOPROL-XL) 50 MG 24 hr tablet Take 1 tablet (50 mg total) by mouth daily. 30 tablet 8  . Multiple Vitamin (MULTIVITAMIN) tablet Take 1 tablet by mouth daily.    . Omega-3 Fatty Acids (FISH OIL) 1000 MG CAPS Take 1 capsule by mouth every other day.     . Potassium Gluconate 595 MG CAPS Take by mouth daily.    . vitamin C (ASCORBIC ACID) 500 MG tablet Take 500 mg by mouth every other day.      No current facility-administered medications for this visit.     Review of Systems  Constitutional: Positive for weight loss (down 1 pound). Negative for diaphoresis, fever and malaise/fatigue.       "I am feeling fine".   HENT: Negative.   Eyes: Negative.   Respiratory: Negative for cough, hemoptysis, sputum production and shortness of breath.   Cardiovascular: Negative for chest pain, palpitations, orthopnea, leg swelling and PND.  Gastrointestinal: Negative for abdominal pain, blood in stool, constipation, diarrhea, melena, nausea and vomiting.       Last colonoscopy was in 09/2015  Genitourinary: Negative for dysuria, frequency, hematuria and urgency.  Musculoskeletal: Negative for back pain, falls, joint pain and myalgias.  Skin: Negative for itching and rash.  Neurological: Negative for dizziness, tremors, weakness and headaches.  Endo/Heme/Allergies: Does not bruise/bleed easily.  Psychiatric/Behavioral: Negative for depression, memory loss and suicidal ideas. The patient is not nervous/anxious and does not have insomnia.   All other systems reviewed and are negative.  Performance status (ECOG): 0 - Asymptomatic  Vital Signs BP 136/74 (BP Location: Left Arm, Patient Position: Sitting)   Pulse 76   Temp (!) 96.5 F  (35.8 C) (Tympanic)   Resp 18   Wt 163 lb 7.5 oz (74.2 kg)   BMI 26.38 kg/m   Physical Exam  Constitutional: She is oriented to person, place, and time. She appears distressed.  HENT:  Head: Normocephalic and atraumatic.  Mouth/Throat: Oropharynx is clear and moist and mucous membranes are normal. No oropharyngeal exudate.  Short gray hair.  Eyes: Pupils are equal, round, and reactive to light. Conjunctivae and EOM are normal. No scleral icterus.  Glasses.  Blue eyes.  Neck: Normal range of motion. Neck supple. No JVD present.  Cardiovascular: Normal rate, regular rhythm, normal heart sounds and intact distal pulses. Exam reveals no gallop and no friction rub.  No murmur heard. Pulmonary/Chest:  Effort normal and breath sounds normal. No respiratory distress. She has no wheezes. She has no rales. Right breast exhibits skin change (fibrocystic changes superiorly). Right breast exhibits no inverted nipple, no mass, no nipple discharge and no tenderness. Left breast exhibits inverted nipple and skin change (healed incision at the 9 o'clock position; scarring at the end of the incision). Left breast exhibits no mass, no nipple discharge and no tenderness.  Abdominal: Soft. Bowel sounds are normal. She exhibits no distension and no mass. There is no abdominal tenderness. There is no rebound and no guarding.  Musculoskeletal: Normal range of motion. She exhibits no edema or tenderness.  Lymphadenopathy:    She has no cervical adenopathy.    She has no axillary adenopathy.       Right: No inguinal and no supraclavicular adenopathy present.       Left: No inguinal and no supraclavicular adenopathy present.  Neurological: She is alert and oriented to person, place, and time.  Skin: Skin is warm and dry. No rash noted. She is not diaphoretic. No erythema.  Psychiatric: Mood, affect and judgment normal.  Nursing note and vitals reviewed.   Appointment on 06/18/2018  Component Date Value Ref  Range Status  . Sodium 06/18/2018 136  135 - 145 mmol/L Final  . Potassium 06/18/2018 4.4  3.5 - 5.1 mmol/L Final  . Chloride 06/18/2018 105  98 - 111 mmol/L Final  . CO2 06/18/2018 23  22 - 32 mmol/L Final  . Glucose, Bld 06/18/2018 101* 70 - 99 mg/dL Final  . BUN 06/18/2018 20  8 - 23 mg/dL Final  . Creatinine, Ser 06/18/2018 0.73  0.44 - 1.00 mg/dL Final  . Calcium 06/18/2018 9.0  8.9 - 10.3 mg/dL Final  . Total Protein 06/18/2018 7.2  6.5 - 8.1 g/dL Final  . Albumin 06/18/2018 3.9  3.5 - 5.0 g/dL Final  . AST 06/18/2018 25  15 - 41 U/L Final  . ALT 06/18/2018 20  0 - 44 U/L Final  . Alkaline Phosphatase 06/18/2018 53  38 - 126 U/L Final  . Total Bilirubin 06/18/2018 0.5  0.3 - 1.2 mg/dL Final  . GFR calc non Af Amer 06/18/2018 >60  >60 mL/min Final  . GFR calc Af Amer 06/18/2018 >60  >60 mL/min Final   Comment: (NOTE) The eGFR has been calculated using the CKD EPI equation. This calculation has not been validated in all clinical situations. eGFR's persistently <60 mL/min signify possible Chronic Kidney Disease.   Toni Callahan gap 06/18/2018 8  5 - 15 Final   Performed at [facility], [address], [place], [place] [address]  . WBC 06/18/2018 7.2  4.0 - 10.5 K/uL Final  . RBC 06/18/2018 4.38  3.87 - 5.11 MIL/uL Final  . Hemoglobin 06/18/2018 13.7  12.0 - 15.0 g/dL Final  . HCT 06/18/2018 40.3  36.0 - 46.0 % Final  . MCV 06/18/2018 92.0  80.0 - 100.0 fL Final  . MCH 06/18/2018 31.3  26.0 - 34.0 pg Final  . MCHC 06/18/2018 34.0  30.0 - 36.0 g/dL Final  . RDW 06/18/2018 11.9  11.5 - 15.5 % Final  . Platelets 06/18/2018 248  150 - 400 K/uL Final  . nRBC 06/18/2018 0.0  0.0 - 0.2 % Final  . Neutrophils Relative % 06/18/2018 61  % Final  . Neutro Abs 06/18/2018 4.3  1.7 - 7.7 K/uL Final  . Lymphocytes Relative 06/18/2018 33  % Final  . Lymphs Abs 06/18/2018 2.4  0.7 - 4.0 K/uL Final  . Monocytes Relative 06/18/2018 6  % Final  . Monocytes Absolute 06/18/2018 0.4  0.1 -  1.0 K/uL Final  . Eosinophils Relative 06/18/2018 0  % Final  . Eosinophils Absolute 06/18/2018 0.0  0.0 - 0.5 K/uL Final  . Basophils Relative 06/18/2018 0  % Final  . Basophils Absolute 06/18/2018 0.0  0.0 - 0.1 K/uL Final  . Immature Granulocytes 06/18/2018 0  % Final  . Abs Immature Granulocytes 06/18/2018 0.03  0.00 - 0.07 K/uL Final   Performed at [facility], [address] [place], [place] [address]    Assessment:  ABY GESSEL is a 77 y.o. adult with stage I Her2/neu+ left breast cancer s/p lumpectomy and sentinel lymph node biopsy on  02/19/2008.  No pathology is available. Notes indicate pathologic stage [id number].  Tumor was ER positive, PR positive and HER-2/neu 3+.  BCI testing on 07/06/2015 revealed a high risk of late recurrence 8.1% (CI 3.3%-12.6%) at years 5-10 with a high likelihood of benefit of extended adjuvant hormonal therapy.  She received 6 cycles of Taxotere, carboplatin, and Herceptin ([facility])  6 cycles (03/16/2008 - 06/29/2008). She received Herceptin every 3 weeks (last cycle 02/15/2009).  She received radiation. She began letrozole (Femara).  She was off therapy for only about 2-3 months.  CA27.29 has been followed: 29.4 on 07/11/2010, 21.2 on 07/24/2011, 23.1 on 02/06/2012, 21.3 on 03/20/2013, 26.0 on 04/30/2014, 40.6 on 04/28/2015, 32.6 on 06/21/2015, 34.7 on 10/29/2016, 28.5 on 05/08/2017, 37.8 on 11/06/2017, and 35.9 on 06/18/2018.  Bilateral diagnostic mammogram on 05/30/2016 revealed no evidence of malignancy (ordered by Dr. Jamal Collin). Bilateral screening mammogram on 06/03/2017  demonstrated no mammographic evidence of malignancy.   Bone density on 05/19/2014 was normal with a T score of 0.3 in the left femur.  Bone density on 05/30/2016 was normal with a T-score of -0.1 in the AP spine (L1-L2).  Bone density on 06/11/2018 revealed osteoporosis with a T-score of -2.6 in the forearm radius.    Symptomatically, she is doing well overall. She  denies an acute concerns. Patient denies that she has experienced any B symptoms. She denies any interval infections. She denies any breast concerns. Exam reveals fibrocystic changes in the RIGHT breast and post-surgical scarring in the LEFT breast. CA27.29 is normal.   Plan: 1.  Labs today:  CBC with diff, CMP, CA27.29. 2.  Stage I Her2/neu+ left breast cancer:  Doing well overall.  No symptoms.  No breast concerns.  Review interval mammogram- no evidence of disease.  CA27.29 is 35.9.   Continue endocrine therapy (letrozole) as previously prescribed.    Confirm start date of hormonal therapy.(re: 5 vs 10 years adjuvant endocrine therapy). 3.  Osteoporosis:  Review new bone density study.    Scan quality was limited by exclusion of L3 and L4 secondary to degenerative changes.  Ten-year fracture probability by FRAX of 3% or greater for hip fracture or 20% or greater for major osteoporotic fracture.  Osteoporosis in forearm radius. Dual femur and total left femur, and AP spine was normal.  Discuss with endocrinology.  Continue calcium and vitamin D 4. RTC in 6 months for MD assessment, labs (CBC with diff, CMP, CA27.29).   Honor Loh, NP  06/18/2018, 10:33 AM   I saw and evaluated the patient, participating in the key portions of the service and reviewing pertinent diagnostic studies and records.  I reviewed the nurse practitioner's note and agree with the findings and the  plan.  A few questions were asked by the patient and answered.   Nolon Stalls, MD 06/18/2018,10:33 AM

## 2018-06-19 LAB — CANCER ANTIGEN 27.29: CA 27.29: 35.9 U/mL (ref 0.0–38.6)

## 2018-07-21 ENCOUNTER — Other Ambulatory Visit: Payer: Self-pay | Admitting: Nurse Practitioner

## 2018-07-21 DIAGNOSIS — E785 Hyperlipidemia, unspecified: Secondary | ICD-10-CM

## 2018-07-21 NOTE — Telephone Encounter (Signed)
Lab Results  Component Value Date   ALT 20 06/18/2018   ALT 29 04/30/2014   Lab Results  Component Value Date   CHOL 208 (H) 09/11/2017   HDL 32 (L) 09/11/2017   LDLCALC 139 (H) 09/11/2017   TRIG 227 (H) 09/11/2017   CHOLHDL 6.5 (H) 09/11/2017   Please see abnormal labs from February and the phone note dated 09/11/17 She was going to start a cholesterol medicine and then come in fasting 3 months later (that would have been in May) So please call her and see just how she has been taking her medicine, and have her come in right away for fasting labs (ORDER lipid panel is last has expired please) We need to document how much she has been taking and decide if that's a good amount for this next Rx Thank you

## 2018-07-22 ENCOUNTER — Other Ambulatory Visit: Payer: Self-pay

## 2018-07-22 NOTE — Telephone Encounter (Signed)
Please see the refill note from yesterday Thank you

## 2018-07-22 NOTE — Telephone Encounter (Signed)
Refill request was sent to Elizabeth Poulose, DNP for approval and submission.  

## 2018-07-23 NOTE — Telephone Encounter (Signed)
Called unable to leave message on 12/17 and today.

## 2018-07-24 MED ORDER — ATORVASTATIN CALCIUM 10 MG PO TABS: ORAL_TABLET | ORAL | 0 refills | Status: DC

## 2018-07-24 NOTE — Telephone Encounter (Signed)
Pt coming in for labs, she just takes med 2-3x per week

## 2018-07-24 NOTE — Addendum Note (Signed)
Addended by: LADA, Satira Anis on: 07/24/2018 09:08 AM   Modules accepted: Orders

## 2018-07-25 LAB — LIPID PANEL
Cholesterol: 168 mg/dL (ref <200)
HDL: 35 mg/dL — AB (ref >50)
LDL CHOLESTEROL (CALC): 96 mg/dL
Non-HDL Cholesterol (Calc): 133 mg/dL (calc) — ABNORMAL HIGH (ref <130)
TRIGLYCERIDES: 261 mg/dL — AB (ref <150)
Total CHOL/HDL Ratio: 4.8 (calc) (ref <5.0)

## 2018-08-07 ENCOUNTER — Telehealth: Payer: Self-pay | Admitting: Family Medicine

## 2018-08-07 NOTE — Telephone Encounter (Signed)
Pt given labs per Dr Sanda Klein, dated 07/25/18 "please let the patient know that her LDL has really improved, down from 139 to 96; continue the medicine, and I just sent a new Rx yesterday"; she verbalized understanding.

## 2018-09-13 ENCOUNTER — Other Ambulatory Visit: Payer: Self-pay | Admitting: Hematology and Oncology

## 2018-09-18 ENCOUNTER — Ambulatory Visit (INDEPENDENT_AMBULATORY_CARE_PROVIDER_SITE_OTHER): Payer: Medicare Other

## 2018-09-18 ENCOUNTER — Encounter: Payer: Self-pay | Admitting: Nurse Practitioner

## 2018-09-18 ENCOUNTER — Ambulatory Visit (INDEPENDENT_AMBULATORY_CARE_PROVIDER_SITE_OTHER): Payer: Medicare Other | Admitting: Nurse Practitioner

## 2018-09-18 VITALS — BP 136/72 | HR 76 | Temp 97.7°F | Resp 16 | Ht 66.0 in | Wt 160.8 lb

## 2018-09-18 DIAGNOSIS — E785 Hyperlipidemia, unspecified: Secondary | ICD-10-CM

## 2018-09-18 DIAGNOSIS — M810 Age-related osteoporosis without current pathological fracture: Secondary | ICD-10-CM

## 2018-09-18 DIAGNOSIS — I471 Supraventricular tachycardia: Secondary | ICD-10-CM | POA: Diagnosis not present

## 2018-09-18 DIAGNOSIS — Z0000 Encounter for general adult medical examination without abnormal findings: Secondary | ICD-10-CM

## 2018-09-18 DIAGNOSIS — I10 Essential (primary) hypertension: Secondary | ICD-10-CM | POA: Diagnosis not present

## 2018-09-18 DIAGNOSIS — C50212 Malignant neoplasm of upper-inner quadrant of left female breast: Secondary | ICD-10-CM

## 2018-09-18 MED ORDER — METOPROLOL SUCCINATE ER 50 MG PO TB24: 50.0000 mg | ORAL_TABLET | Freq: Every day | ORAL | 11 refills | Status: DC

## 2018-09-18 MED ORDER — ATORVASTATIN CALCIUM 10 MG PO TABS: ORAL_TABLET | ORAL | 3 refills | Status: DC

## 2018-09-18 NOTE — Progress Notes (Signed)
Subjective:   Toni Callahan is a 78 y.o. female who presents for Medicare Annual (Subsequent) preventive examination.  Review of Systems:   Cardiac Risk Factors include: advanced age (>80mn, >>5women);dyslipidemia     Objective:     Vitals: BP 136/72 (BP Location: Right Arm, Patient Position: Sitting, Cuff Size: Normal)   Pulse 76   Temp 97.7 F (36.5 C) (Oral)   Resp 16   Ht '5\' 6"'  (1.676 m)   Wt 160 lb 12.8 oz (72.9 kg)   SpO2 96%   BMI 25.95 kg/m   Body mass index is 25.95 kg/m.  Advanced Directives 09/18/2018 06/18/2018 11/06/2017 05/08/2017 05/07/2017 03/11/2017 10/29/2016  Does Patient Have a Medical Advance Directive? Yes Yes Yes Yes Yes Yes Yes  Type of [place] will [place] will - - - - -  Does patient want to make changes to medical advance directive? - - - - - - -  Copy of [place] Chart? No - copy requested No - copy requested - - - - -    Tobacco Social History   Tobacco Use  Smoking Status Former Smoker  . Years: 20.00  . Types: Cigarettes  . Last attempt to quit: 08/06/1984  . Years since quitting: 34.1  Smokeless Tobacco Never Used  Tobacco Comment   quit in 1986 approximately     Counseling given: Not Answered Comment: quit in 1986 approximately   Clinical Intake:  Pre-visit preparation completed: Yes  Pain : No/denies pain     BMI - recorded: 25.95 Nutritional Status: BMI 25 -29 Overweight Nutritional Risks: None Diabetes: No  How often do you need to have someone help you when you read instructions, pamphlets, or other written materials from your doctor or pharmacy?: 1 - Never What is the last grade level you completed in school?: bachelor's degree  Interpreter Needed?: No  Information entered by :: KClemetine MarkerLPN  Past Medical History:  Diagnosis Date  . Allergy   . [place]   [place] /chemo/rad  . [place]   left breast, T1, N0, M0. ER/PR positive, HER2 neu positive  . Dental bridge present    permanant - upper  . Hyperlipidemia   . Personal history of chemotherapy   . Personal history of radiation therapy   . Personal history of tobacco use, presenting hazards to health   . Tachycardia    Past Surgical History:  Procedure Laterality Date  . BREAST BIOPSY Left 2009   positive  . BREAST CYST ASPIRATION Bilateral 2005   neg  . BREAST LUMPECTOMY Left 2009   positive  . BREAST SURGERY Left 2009   lumpectomy  . CATARACT EXTRACTION W/ INTRAOCULAR LENS IMPLANT Bilateral   . COLONOSCOPY  [id number]  Dr. OCandace Cruise . COLONOSCOPY N/A 09/07/2015   Procedure: COLONOSCOPY;  Surgeon: PHulen Luster MD;  [facility]  Service: Gastroenterology;  Laterality: N/A;  . POLYPECTOMY  09/07/2015   Procedure: POLYPECTOMY;  Surgeon: PHulen Luster MD;  [facility]  Service: Gastroenterology;;  . PORT-A-CATH REMOVAL  2011  . PORTACATH PLACEMENT  2009  . TONSILLECTOMY     Family History  Problem Relation Age of Onset  . Stroke Mother   . Heart disease Father   . Diabetes Brother   . Heart disease Paternal Grandmother   . Cancer Neg Hx   . COPD Neg Hx   .  Hypertension Neg Hx   . Breast cancer Neg Hx    Social History   Socioeconomic History  . Marital status: Married    Spouse name: Not on file  . Number of children: 0  . Years of education: Not on file  . Highest education level: Bachelor's degree (e.g., BA, AB, BS)  Occupational History  . Not on file  Social Needs  . Financial resource strain: Not hard at all  . Food insecurity:    Worry: Never true    Inability: Never true  . Transportation needs:    Medical: No    Non-medical: No  Tobacco Use  . Smoking status: Former Smoker    Years: 20.00    Types: Cigarettes    Last attempt to quit: 08/06/1984    Years since quitting: 34.1  . Smokeless tobacco: Never Used  . Tobacco comment: quit in 1986 approximately    Substance and Sexual Activity  . Alcohol use: Yes    Comment: drinks rarely  . Drug use: No  . Sexual activity: Yes  Lifestyle  . Physical activity:    Days per week: 2 days    Minutes per session: 50 min  . Stress: Not at all  Relationships  . Social connections:    Talks on phone: More than three times a week    Gets together: Three times a week    Attends religious service: More than 4 times per year    Active member of club or organization: No    Attends meetings of clubs or organizations: Never    Relationship status: Married  Other Topics Concern  . Not on file  Social History Narrative  . Not on file    Outpatient Encounter Medications as of 09/18/2018  Medication Sig  . aspirin 81 MG tablet Take 81 mg by mouth daily.  Marland Kitchen atorvastatin (LIPITOR) 10 MG tablet One by mouth at bedtime three nights a week  . Biotin 5000 MCG CAPS Take 1 capsule by mouth daily.  . Calcium Carbonate (CALCIUM 600 PO) Take 1 tablet by mouth every other day.   . Cholecalciferol (VITAMIN D PO) Take 1,000 Int'l Units by mouth daily.  . Cyanocobalamin (VITAMIN B 12 PO) Take 500 mg by mouth daily.  . folic acid (FOLVITE) 454 MCG tablet Take 400 mcg by mouth daily.  Marland Kitchen letrozole (FEMARA) 2.5 MG tablet Take 1 tablet (2.5 mg total) by mouth daily.  . Magnesium 250 MG TABS Take 1 tablet by mouth daily.  . metoprolol succinate (TOPROL-XL) 50 MG 24 hr tablet Take 1 tablet (50 mg total) by mouth daily.  . Multiple Vitamin (MULTIVITAMIN) tablet Take 1 tablet by mouth daily.  . Omega-3 Fatty Acids (FISH OIL) 1000 MG CAPS Take 1 capsule by mouth every other day.   . Potassium Gluconate 595 MG CAPS Take by mouth daily.  . vitamin C (ASCORBIC ACID) 500 MG tablet Take 500 mg by mouth every other day.    No facility-administered encounter medications on file as of 09/18/2018.     Activities of Daily Living In your present state of health, do you have any difficulty performing the following activities: 09/18/2018   Hearing? N  Comment declines hearing aids  Vision? N  Comment wears glasses  Difficulty concentrating or making decisions? N  Walking or climbing stairs? N  Dressing or bathing? N  Doing errands, shopping? N  Preparing Food and eating ? N  Using the Toilet? N  In the past six  months, have you accidently leaked urine? N  Do you have problems with loss of bowel control? N  Managing your Medications? N  Managing your Finances? N  Housekeeping or managing your Housekeeping? N  Some recent data might be hidden    Patient Care Team: Lada, Satira Anis, MD as PCP - General (Family Medicine) Corey Skains, MD as Consulting Physician (Cardiology) Bary Castilla, Forest Gleason, MD (General Surgery) Lequita Asal, MD as Referring Physician (Hematology and Oncology) Efrain Sella, MD as Consulting Physician (Gastroenterology)    Assessment:   This is a routine wellness examination for [place].  Exercise Activities and Dietary recommendations Current Exercise Habits: Structured exercise class, Type of exercise: Other - see comments(silver sneakers ), Time (Minutes): 45, Frequency (Times/Week): 2, Weekly Exercise (Minutes/Week): 90, Intensity: Moderate, Exercise limited by: None identified  Goals    . DIET - EAT MORE FRUITS AND VEGETABLES (pt-stated)     Five a day       Fall Risk Fall Risk  09/18/2018 06/12/2018 09/11/2017 05/07/2017 03/11/2017  Falls in the past year? 0 0 No No No  Number falls in past yr: 0 - - - -  Injury with Fall? 0 - - - -  Follow up Falls prevention discussed - - - -   FALL RISK PREVENTION PERTAINING TO THE HOME:  Any stairs in or around the home? Yes  If so, are they are without handrails? Yes   Home free of loose throw rugs in walkways, pet beds, electrical cords, etc? Yes  Adequate lighting in your home to reduce risk of falls? Yes   ASSISTIVE DEVICES UTILIZED TO PREVENT FALLS:  Life alert? No  Use of a cane, walker or w/c? No  Grab bars in the  bathroom? Yes  Shower chair or bench in shower? No  Elevated toilet seat or a handicapped toilet? Yes   DME ORDERS:  DME order needed?  No   TIMED UP AND GO:  Was the test performed? Yes .  Length of time to ambulate 10 feet: 5 sec.   GAIT:  Appearance of gait: Gait stead-fast and without the use of an assistive device.   Education: Fall risk prevention has been discussed.  Intervention(s) required? No   Depression Screen PHQ 2/9 Scores 09/18/2018 09/11/2017 05/07/2017 03/11/2017  PHQ - 2 Score 0 0 0 0  PHQ- 9 Score 0 - - -     Cognitive Function     6CIT Screen 09/18/2018 09/11/2017  What Year? 0 points 0 points  What month? 0 points 0 points  What time? 0 points 0 points  Count back from 20 0 points 0 points  Months in reverse 0 points 4 points  Repeat phrase 0 points 0 points  Total Score 0 4    Immunization History  Administered Date(s) Administered  . Influenza,inj,quad, With Preservative 05/02/2018  . Influenza-Unspecified 04/09/2015, 04/11/2016, 05/02/2018  . Pneumococcal Conjugate-13 03/02/2014  . Pneumococcal Polysaccharide-23 05/01/2005  . Td 08/07/2003  . Tdap 08/25/2013  . Zoster 05/16/2006  . Zoster Recombinat (Shingrix) 06/07/2017, 09/06/2017    Qualifies for Shingles Vaccine? Yes  Zostavax completed 2007. Shingrix completed 2019.  Tdap: Up to date  Flu Vaccine: Up to date  Pneumococcal Vaccine: Up to date   Screening Tests Health Maintenance  Topic Date Due  . COLONOSCOPY  09/06/2018  . MAMMOGRAM  06/12/2019  . DEXA SCAN  06/11/2020  . TETANUS/TDAP  08/26/2023  . INFLUENZA VACCINE  Completed  . PNA vac Low  Risk Adult  Completed    Cancer Screenings:  Colorectal Screening: Completed 09/07/15. Repeat every 3 years. Scheduled for repeat screening 11/05/18.  Mammogram: Completed 06/11/18. Repeat every year.  Bone Density: Completed 06/11/18. Results reflect  OSTEOPOROSIS. Repeat every 2 years.   Lung Cancer Screening: (Low Dose CT Chest  recommended if Age 80-80 years, 30 pack-year currently smoking OR have quit w/in 15years.) does qualify.   Additional Screening:  Hepatitis C Screening: no longer required.   Vision Screening: Recommended annual ophthalmology exams for early detection of glaucoma and other disorders of the eye. Is the patient up to date with their annual eye exam?  Yes  Who is the provider or what is the name of the office in which the pt attends annual eye exams? Dr. Atilano Median  Dental Screening: Recommended annual dental exams for proper oral hygiene  Community Resource Referral:  CRR required this visit?  No      Plan:     I have personally reviewed and addressed the Medicare Annual Wellness questionnaire and have noted the following in the patient's chart:  A. Medical and social history B. Use of alcohol, tobacco or illicit drugs  C. Current medications and supplements D. Functional ability and status E.  Nutritional status F.  Physical activity G. Advance directives H. List of other physicians I.  Hospitalizations, surgeries, and ER visits in previous 12 months J.  Derma such as hearing and vision if needed, cognitive and depression L. Referrals and appointments   In addition, I have reviewed and discussed with patient certain preventive protocols, quality metrics, and best practice recommendations. A written personalized care plan for preventive services as well as general preventive health recommendations were provided to patient.   Signed,  Clemetine Marker, LPN Nurse Health Advisor   Nurse Notes: Pt doing well and appreciative of visit today. Pt has same day appt with Dr. Sanda Klein

## 2018-09-18 NOTE — Patient Instructions (Signed)
Toni Callahan , Thank you for taking time to come for your Medicare Wellness Visit. I appreciate your ongoing commitment to your health goals. Please review the following plan we discussed and let me know if I can assist you in the future.   Screening recommendations/referrals: Colonoscopy: 09/07/2015. Scheduled for 11/05/18 Mammogram: done 06/11/18. Repeat every year.  Bone Density: done 06/11/18. Repeat in 2021. Recommended yearly ophthalmology/optometry visit for glaucoma screening and checkup Recommended yearly dental visit for hygiene and checkup  Vaccinations: Influenza vaccine: done 05/02/18 Pneumococcal vaccine: done 03/02/14 Tdap vaccine: done 08/25/13 Shingles vaccine: series completed 09/06/17    Advanced directives: Please bring a copy of your health care power of attorney and living will to the office at your convenience.  Conditions/risks identified: Keep up the great work!  Next appointment: Please follow up in one year for your Medicare Annual Wellness visit.     Preventive Care 34 Years and Older, Female Preventive care refers to lifestyle choices and visits with your health care provider that can promote health and wellness. What does preventive care include?  A yearly physical exam. This is also called an annual well check.  Dental exams once or twice a year.  Routine eye exams. Ask your health care provider how often you should have your eyes checked.  Personal lifestyle choices, including:  Daily care of your teeth and gums.  Regular physical activity.  Eating a healthy diet.  Avoiding tobacco and drug use.  Limiting alcohol use.  Practicing safe sex.  Taking low-dose aspirin every day.  Taking vitamin and mineral supplements as recommended by your health care provider. What happens during an annual well check? The services and screenings done by your health care provider during your annual well check will depend on your age, overall health, lifestyle risk  factors, and family history of disease. Counseling  Your health care provider may ask you questions about your:  Alcohol use.  Tobacco use.  Drug use.  Emotional well-being.  Home and relationship well-being.  Sexual activity.  Eating habits.  History of falls.  Memory and ability to understand (cognition).  Work and work Statistician.  Reproductive health. Screening  You may have the following tests or measurements:  Height, weight, and BMI.  Blood pressure.  Lipid and cholesterol levels. These may be checked every 5 years, or more frequently if you are over 93 years old.  Skin check.  Lung cancer screening. You may have this screening every year starting at age 21 if you have a 30-pack-year history of smoking and currently smoke or have quit within the past 15 years.  Fecal occult blood test (FOBT) of the stool. You may have this test every year starting at age 28.  Flexible sigmoidoscopy or colonoscopy. You may have a sigmoidoscopy every 5 years or a colonoscopy every 10 years starting at age 57.  Hepatitis C blood test.  Hepatitis B blood test.  Sexually transmitted disease (STD) testing.  Diabetes screening. This is done by checking your blood sugar (glucose) after you have not eaten for a while (fasting). You may have this done every 1-3 years.  Bone density scan. This is done to screen for osteoporosis. You may have this done starting at age 57.  Mammogram. This may be done every 1-2 years. Talk to your health care provider about how often you should have regular mammograms. Talk with your health care provider about your test results, treatment options, and if necessary, the need for more tests. Vaccines  Your health care provider may recommend certain vaccines, such as:  Influenza vaccine. This is recommended every year.  Tetanus, diphtheria, and acellular pertussis (Tdap, Td) vaccine. You may need a Td booster every 10 years.  Zoster vaccine. You  may need this after age 26.  Pneumococcal 13-valent conjugate (PCV13) vaccine. One dose is recommended after age 24.  Pneumococcal polysaccharide (PPSV23) vaccine. One dose is recommended after age 3. Talk to your health care provider about which screenings and vaccines you need and how often you need them. This information is not intended to replace advice given to you by your health care provider. Make sure you discuss any questions you have with your health care provider. Document Released: 08/19/2015 Document Revised: 04/11/2016 Document Reviewed: 05/24/2015 Elsevier Interactive Patient Education  2017 [place] Prevention in the Home Falls can cause injuries. They can happen to people of all ages. There are many things you can do to make your home safe and to help prevent falls. What can I do on the outside of my home?  Regularly fix the edges of walkways and driveways and fix any cracks.  Remove anything that might make you trip as you walk through a door, such as a raised step or threshold.  Trim any bushes or trees on the path to your home.  Use bright outdoor lighting.  Clear any walking paths of anything that might make someone trip, such as rocks or tools.  Regularly check to see if handrails are loose or broken. Make sure that both sides of any steps have handrails.  Any raised decks and porches should have guardrails on the edges.  Have any leaves, snow, or ice cleared regularly.  Use sand or salt on walking paths during winter.  Clean up any spills in your garage right away. This includes oil or grease spills. What can I do in the bathroom?  Use night lights.  Install grab bars by the toilet and in the tub and shower. Do not use towel bars as grab bars.  Use non-skid mats or decals in the tub or shower.  If you need to sit down in the shower, use a plastic, non-slip stool.  Keep the floor dry. Clean up any water that spills on the floor as soon as  it happens.  Remove soap buildup in the tub or shower regularly.  Attach bath mats securely with double-sided non-slip rug tape.  Do not have throw rugs and other things on the floor that can make you trip. What can I do in the bedroom?  Use night lights.  Make sure that you have a light by your bed that is easy to reach.  Do not use any sheets or blankets that are too big for your bed. They should not hang down onto the floor.  Have a firm chair that has side arms. You can use this for support while you get dressed.  Do not have throw rugs and other things on the floor that can make you trip. What can I do in the kitchen?  Clean up any spills right away.  Avoid walking on wet floors.  Keep items that you use a lot in easy-to-reach places.  If you need to reach something above you, use a strong step stool that has a grab bar.  Keep electrical cords out of the way.  Do not use floor polish or wax that makes floors slippery. If you must use wax, use non-skid floor wax.  Do  not have throw rugs and other things on the floor that can make you trip. What can I do with my stairs?  Do not leave any items on the stairs.  Make sure that there are handrails on both sides of the stairs and use them. Fix handrails that are broken or loose. Make sure that handrails are as long as the stairways.  Check any carpeting to make sure that it is firmly attached to the stairs. Fix any carpet that is loose or worn.  Avoid having throw rugs at the top or bottom of the stairs. If you do have throw rugs, attach them to the floor with carpet tape.  Make sure that you have a light switch at the top of the stairs and the bottom of the stairs. If you do not have them, ask someone to add them for you. What else can I do to help prevent falls?  Wear shoes that:  Do not have high heels.  Have rubber bottoms.  Are comfortable and fit you well.  Are closed at the toe. Do not wear sandals.  If  you use a stepladder:  Make sure that it is fully opened. Do not climb a closed stepladder.  Make sure that both sides of the stepladder are locked into place.  Ask someone to hold it for you, if possible.  Clearly mark and make sure that you can see:  Any grab bars or handrails.  First and last steps.  Where the edge of each step is.  Use tools that help you move around (mobility aids) if they are needed. These include:  Canes.  Walkers.  Scooters.  Crutches.  Turn on the lights when you go into a dark area. Replace any light bulbs as soon as they burn out.  Set up your furniture so you have a clear path. Avoid moving your furniture around.  If any of your floors are uneven, fix them.  If there are any pets around you, be aware of where they are.  Review your medicines with your doctor. Some medicines can make you feel dizzy. This can increase your chance of falling. Ask your doctor what other things that you can do to help prevent falls. This information is not intended to replace advice given to you by your health care provider. Make sure you discuss any questions you have with your health care provider. Document Released: 05/19/2009 Document Revised: 12/29/2015 Document Reviewed: 08/27/2014 Elsevier Interactive Patient Education  2017 [employer].

## 2018-09-18 NOTE — Progress Notes (Signed)
Name: Toni Callahan   MRN: [id number]    DOB: March 16, 1941   Date:09/18/2018       Progress Note  Subjective  Chief Complaint  Chief Complaint  Patient presents with  . Follow-up    HPI  Patient presents for routine follow-up for the following conditions:  Hypertension & SVT: takes metoprolol 43m. Denies palpitations, chest pain, headaches, blurry vision.   Hyperlipidemia: takes atorvastatin 1109m3 nights a week. Last LDL on 07/24/18 was 96   Osteoporosis: takes calcium and Vitamin D supplementation. Last bone density scan 06/11/2018 T-score of -2.6 in forearm. Never had any broken bones. Goes to the gym- twice a week; walks there at least 30 minutes. Does a lot of house work. Non-slip stair covers.   Breast Cancer: left breast- lumpectomy last mammogram clear; follows up with Dr. CoMike Gipn feOzella AlmondNo issues with this medications.   States overall is in good health, lives with husband, has good community support.   Patient Active Problem List   Diagnosis Date Noted  . Localized osteoporosis without current pathological fracture 06/18/2018  . Medicare annual wellness visit, subsequent 09/11/2017  . Colon cancer screening 09/11/2017  . Hypertriglyceridemia 09/10/2016  . 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 Counseling regarding end of life decision making 09/18/2015  . Preventative health care 09/18/2015  . Medication monitoring encounter 09/18/2015  . Hyperglycemia 09/18/2015  . Postmenopausal 09/16/2015  . Screening for osteoporosis 09/16/2015  . Loose stools 07/26/2015  . Other fecal abnormalities 07/26/2015  . Hyperlipidemia LDL goal <100 04/28/2015  . Supraventricular tachycardia (HCTatum09/22/2016  . Abdominal pain 04/28/2015  . Benign essential HTN 09/16/2014  . TI (tricuspid incompetence) 09/16/2014  . 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 Presence of intraocular lens 09/10/2014  . Breast cancer of upper-inner  quadrant of left female breast ([place]10/29/2015  . Breathlessness on exertion 05/17/2014    Past Medical History:  Diagnosis Date  . Allergy   . Breast cancer ([facility]   left /chemo/rad  . Cancer ([facility]   left breast, T1, N0, M0. ER/PR positive, HER2 neu positive  . Dental bridge present    permanant - upper  . Hyperlipidemia   . Personal history of chemotherapy   . Personal history of radiation therapy   . Personal history of tobacco use, presenting hazards to health   . Tachycardia     Past Surgical History:  Procedure Laterality Date  . BREAST BIOPSY Left 2009   positive  . BREAST CYST ASPIRATION Bilateral 2005   neg  . BREAST LUMPECTOMY Left 2009   positive  . BREAST SURGERY Left 2009   lumpectomy  . CATARACT EXTRACTION W/ INTRAOCULAR LENS IMPLANT Bilateral   . COLONOSCOPY  [id number] Dr. OhCandace Cruise. COLONOSCOPY N/A 09/07/2015   Procedure: COLONOSCOPY;  Surgeon: PaHulen LusterMD;  [facility];  Laterality: N/A;  . POLYPECTOMY  09/07/2015   Procedure: POLYPECTOMY;  Surgeon: PaHulen LusterMD;  [place] [facility] [place] [facility];;  . PORT-A-CATH REMOVAL  2011  . PORTACATH PLACEMENT  2009  . TONSILLECTOMY      Social History   Tobacco Use  . Smoking status: Former Smoker    Years: 20.00    Types: Cigarettes    Last attempt to quit: 08/06/1984    Years since quitting: 34.1  . Smokeless tobacco: Never Used  . Tobacco comment: quit in 1986 approximately  Substance Use Topics  .  Alcohol use: Yes    Comment: drinks rarely     Current Outpatient Medications:  .  aspirin 81 MG tablet, Take 81 mg by mouth daily., Disp: , Rfl:  .  atorvastatin (LIPITOR) 10 MG tablet, One by mouth at bedtime three nights a week, Disp: 12 tablet, Rfl: 0 .  Biotin 5000 MCG CAPS, Take 1 capsule by mouth daily., Disp: , Rfl:  .  Calcium Carbonate (CALCIUM 600 PO), Take 1 tablet by mouth every other day. , Disp: , Rfl:  .   Cholecalciferol (VITAMIN D PO), Take 1,000 Int'l Units by mouth daily., Disp: , Rfl:  .  Cyanocobalamin (VITAMIN B 12 PO), Take 500 mg by mouth daily., Disp: , Rfl:  .  folic acid (FOLVITE) 416 MCG tablet, Take 400 mcg by mouth daily., Disp: , Rfl:  .  letrozole (FEMARA) 2.5 MG tablet, Take 1 tablet (2.5 mg total) by mouth daily., Disp: 90 tablet, Rfl: 0 .  Magnesium 250 MG TABS, Take 1 tablet by mouth daily., Disp: , Rfl:  .  metoprolol succinate (TOPROL-XL) 50 MG 24 hr tablet, Take 1 tablet (50 mg total) by mouth daily., Disp: 30 tablet, Rfl: 8 .  Multiple Vitamin (MULTIVITAMIN) tablet, Take 1 tablet by mouth daily., Disp: , Rfl:  .  Omega-3 Fatty Acids (FISH OIL) 1000 MG CAPS, Take 1 capsule by mouth every other day. , Disp: , Rfl:  .  Potassium Gluconate 595 MG CAPS, Take by mouth daily., Disp: , Rfl:  .  vitamin C (ASCORBIC ACID) 500 MG tablet, Take 500 mg by mouth every other day. , Disp: , Rfl:   Allergies  Allergen Reactions  . Sulfa Antibiotics Nausea Only and Other (See Comments)    Upset stomach    Review of Systems  Constitutional: Negative for chills, fever and malaise/fatigue.  HENT: Negative for congestion, ear pain and sore throat.   Eyes: Negative for blurred vision, double vision and pain.  Respiratory: Negative for cough and shortness of breath.   Cardiovascular: Negative for chest pain and palpitations.  Gastrointestinal: Negative for abdominal pain, blood in stool, diarrhea, nausea and vomiting.  Genitourinary: Negative for dysuria and flank pain.  Musculoskeletal: Negative for falls and myalgias.  Skin: Negative for rash.  Neurological: Negative for dizziness, tingling, focal weakness and headaches.  Psychiatric/Behavioral: Negative for depression. The patient is not nervous/anxious.     No other specific complaints in a complete review of systems (except as listed in HPI above).  Objective  Vitals:   09/18/18 0849  BP: 136/72  Pulse: 76  Resp: 16  Temp:  97.7 F (36.5 C)  TempSrc: Oral  SpO2: 96%  Weight: 160 lb 12.8 oz (72.9 kg)  Height: _0  (1.676 m)     Body mass index is 25.95 kg/m.  Nursing Note and Vital Signs reviewed.  Physical Exam Vitals signs reviewed.  Constitutional:      Appearance: She is well-developed.  HENT:     Head: Normocephalic and atraumatic.     Nose: Nose normal.     Mouth/Throat:     Mouth: Mucous membranes are moist.     Pharynx: Oropharynx is clear. No posterior oropharyngeal erythema.  Eyes:     Extraocular Movements: Extraocular movements intact.     Conjunctiva/sclera: Conjunctivae normal.     Pupils: Pupils are equal, round, and reactive to light.  Neck:     Musculoskeletal: Normal range of motion and neck supple.     Vascular: No carotid bruit.  Cardiovascular:     Pulses: Normal pulses.     Heart sounds: Normal heart sounds.  Pulmonary:     Effort: Pulmonary effort is normal.     Breath sounds: Normal breath sounds.  Abdominal:     General: Bowel sounds are normal.     Palpations: Abdomen is soft.     Tenderness: There is no abdominal tenderness.  Musculoskeletal: Normal range of motion.  Lymphadenopathy:     Cervical: No cervical adenopathy.  Skin:    General: Skin is warm and dry.     Capillary Refill: Capillary refill takes less than 2 seconds.  Neurological:     Mental Status: She is alert and oriented to person, place, and time.     GCS: GCS eye subscore is 4. GCS verbal subscore is 5. GCS motor subscore is 6.     Sensory: No sensory deficit.  Psychiatric:        Speech: Speech normal.        Behavior: Behavior normal.        Thought Content: Thought content normal.        Judgment: Judgment normal.       No results found for this or any previous visit (from the past 48 hour(s)).  Assessment & Plan  1. Supraventricular tachycardia ([place]) Well controlled - metoprolol succinate (TOPROL-XL) 50 MG 24 hr tablet; Take 1 tablet (50 mg total) by mouth daily.  Dispense:  30 tablet; Refill: 11  2. Benign essential HTN Well controlled, not orthostatic - metoprolol succinate (TOPROL-XL) 50 MG 24 hr tablet; Take 1 tablet (50 mg total) by mouth daily.  Dispense: 30 tablet; Refill: 11  3. Hyperlipidemia LDL goal <100 Controlled, CMP due with oncology.  - atorvastatin (LIPITOR) 10 MG tablet; One by mouth at bedtime three nights a week  Dispense: 36 tablet; Refill: 3  4. Age-related osteoporosis without current pathological fracture Continue vitamin D and calcium supplementation.   5. Malignant neoplasm of upper-inner quadrant of left female breast, unspecified estrogen receptor status ([place]) Last mammo clear- continue routine follow-up with oncology.     -Red flags and when to present for emergency care or RTC including fever >101.34F, chest pain, shortness of breath, new/worsening/un-resolving symptoms, reviewed with patient at time of visit. Follow up and care instructions discussed and provided in AVS.

## 2018-09-18 NOTE — Patient Instructions (Signed)
Good cholesterol, also called high-density lipoprotein (HDL) removes extra cholesterol and plaque buildup in your arteries and then sends it to your liver to get rid of and helps reduce your risk of heart disease, heart attack, and stroke.Foods that increase HDL: beans and legumes, whole grains, high-fiber fruits:prunes, apples, and pears; fatty fish- salmon, tuna, sardines; nuts, olive oil   

## 2018-11-05 ENCOUNTER — Encounter: Admission: RE | Payer: Self-pay | Source: Home / Self Care

## 2018-11-05 ENCOUNTER — Ambulatory Visit: Admission: RE | Admit: 2018-11-05 | Payer: Medicare Other | Source: Home / Self Care | Admitting: Internal Medicine

## 2018-11-05 SURGERY — COLONOSCOPY WITH PROPOFOL
Anesthesia: General

## 2018-12-16 ENCOUNTER — Other Ambulatory Visit: Payer: Self-pay

## 2018-12-16 NOTE — Progress Notes (Signed)
[facility]  [address] [place], [place] [address] Phone: [phone]  Fax: [phone]   Clinic Day:  12/17/2018  Referring physician: Arnetha Courser, MD  Chief Complaint: Toni Callahan is a 78 y.o. adult with stage I Her2/neu+ left breast cancer who is seen for 6 month assessment.  HPI: The patient was last seen in the medical oncology clinic on 06/18/2018. At that time, she was doing well overall. She denied any acute concerns. Patient denied any B symptoms. She denied any interval infections. She denied any breast concerns. Exam revealed fibrocystic changes in the RIGHT breast and post-surgical scarring in the LEFT breast. CA27.29 was normal. She continued on Femara.   She was scheduled for a colonoscopy on 11/05/2018, but it was canceled due to the COVID-19 pandemic. She will call to reschedule.   During the interim, she has been "fine."  She denies any blood in her stool or black stools. She does monthly breast exams. She denies any breast concerns.   She takes a calcium and vitamin D supplement. She is very active taking care of her yard and home. She denies any pain.  She wishes to continue annual follow-up in the oncology clinic.    Past Medical History:  Diagnosis Date  . Allergy   . Breast cancer ([place]) 2009   left /chemo/rad  . Cancer ([place]) 2009   left breast, T1, N0, M0. ER/PR positive, HER2 neu positive  . Dental bridge present    permanant - upper  . Hyperlipidemia   . Personal history of chemotherapy   . Personal history of radiation therapy   . Personal history of tobacco use, presenting hazards to health   . Tachycardia     Past Surgical History:  Procedure Laterality Date  . BREAST BIOPSY Left 2009   positive  . BREAST CYST ASPIRATION Bilateral 2005   neg  . BREAST LUMPECTOMY Left 2009   positive  . BREAST SURGERY Left 2009   lumpectomy  . CATARACT EXTRACTION W/ INTRAOCULAR LENS IMPLANT Bilateral   .  COLONOSCOPY  [id number]   Dr. Candace Cruise  . COLONOSCOPY N/A 09/07/2015   Procedure: COLONOSCOPY;  Surgeon: Hulen Luster, MD;  Location: [place];  Service: Gastroenterology;  Laterality: N/A;  . POLYPECTOMY  09/07/2015   Procedure: POLYPECTOMY;  Surgeon: Hulen Luster, MD;  Location: [place];  Service: Gastroenterology;;  . PORT-A-CATH REMOVAL  2011  . PORTACATH PLACEMENT  2009  . TONSILLECTOMY      Family History  Problem Relation Age of Onset  . Stroke Mother   . Heart disease Father   . Diabetes Brother   . Heart disease Paternal Grandmother   . Cancer Neg Hx   . COPD Neg Hx   . Hypertension Neg Hx   . Breast cancer Neg Hx     Social History:  reports that she quit smoking about 34 years ago. Her smoking use included cigarettes. She quit after 20.00 years of use. She has never used smokeless tobacco. She reports current alcohol use. She reports that she does not use drugs. She has lived in the area 24 years.  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 She lives in [place].  The patient is alone today.  Allergies:  Allergies  Allergen Reactions  . Sulfa Antibiotics Nausea Only and Other (See Comments)    Upset stomach  Current Medications: Current Outpatient Medications  Medication Sig Dispense Refill  . aspirin 81 MG tablet Take 81 mg by mouth daily.    Marland Kitchen atorvastatin (LIPITOR) 10 MG tablet One by mouth at bedtime three nights a week 36 tablet 3  . Biotin 5000 MCG CAPS Take 1 capsule by mouth daily.    . Calcium Carbonate (CALCIUM 600 PO) Take 1 tablet by mouth every other day.     . Cholecalciferol (VITAMIN D PO) Take 1,000 Int'l Units by mouth daily.    . Cyanocobalamin (VITAMIN B 12 PO) Take 500 mg by mouth daily.    . folic acid (FOLVITE) 340 MCG tablet Take 400 mcg by mouth daily.    Marland Kitchen letrozole (FEMARA) 2.5 MG tablet Take 1 tablet (2.5 mg total) by mouth daily. 90 tablet 0  . Magnesium 250 MG  TABS Take 1 tablet by mouth daily.    . metoprolol succinate (TOPROL-XL) 50 MG 24 hr tablet Take 1 tablet (50 mg total) by mouth daily. 30 tablet 11  . Multiple Vitamin (MULTIVITAMIN) tablet Take 1 tablet by mouth daily.    . Omega-3 Fatty Acids (FISH OIL) 1000 MG CAPS Take 1 capsule by mouth every other day.     . Potassium Gluconate 595 MG CAPS Take by mouth daily.    . vitamin C (ASCORBIC ACID) 500 MG tablet Take 500 mg by mouth every other day.      No current facility-administered medications for this visit.     Review of Systems  Constitutional: Negative for chills, diaphoresis, fever, malaise/fatigue and weight loss (up 2 pounds).       "I am feeling fine".  HENT: Negative.  Negative for congestion, ear pain, hearing loss, nosebleeds, sinus pain and sore throat.   Eyes: Negative.  Negative for blurred vision, double vision and photophobia.  Respiratory: Negative.  Negative for cough, hemoptysis, sputum production and shortness of breath.   Cardiovascular: Negative.  Negative for chest pain, palpitations, orthopnea, leg swelling and PND.  Gastrointestinal: Negative.  Negative for abdominal pain, blood in stool, constipation, diarrhea, melena, nausea and vomiting.       Last colonoscopy was in 09/2015.  Genitourinary: Negative.  Negative for dysuria, frequency, hematuria and urgency.  Musculoskeletal: Negative.  Negative for back pain, falls, joint pain and myalgias.  Skin: Negative.  Negative for itching and rash.  Neurological: Negative.  Negative for dizziness, tingling, tremors, speech change, weakness and headaches.  Endo/Heme/Allergies: Does not bruise/bleed easily.  Psychiatric/Behavioral: Negative.  Negative for depression and memory loss. The patient is not nervous/anxious and does not have insomnia.   All other systems reviewed and are negative.  Performance status (ECOG):  0  Physical Exam  Constitutional: She is oriented to person, place, and time. She appears  well-developed and well-nourished. No distress.  HENT:  Head: Normocephalic and atraumatic.  Mouth/Throat: Oropharynx is clear and moist. No oropharyngeal exudate.  Curly white hair.  Eyes: Pupils are equal, round, and reactive to light. Conjunctivae and EOM are normal. No scleral icterus.  Neck: Normal range of motion. Neck supple. No JVD present.  Cardiovascular: Normal rate, regular rhythm and normal heart sounds. Exam reveals no gallop and no friction rub.  No murmur heard. Pulmonary/Chest: Effort normal and breath sounds normal. No respiratory distress. She has no wheezes. She has no rales. Right breast exhibits no inverted nipple, no mass, no nipple discharge, no skin change (fibrocystic changes) and no tenderness. Left breast exhibits skin change (post-surgical changes in left upper  quadrant). Left breast exhibits no inverted nipple, no mass, no nipple discharge and no tenderness.  Abdominal: Soft. Bowel sounds are normal. She exhibits no distension and no mass. There is no abdominal tenderness. There is no rebound and no guarding.  Musculoskeletal: Normal range of motion.        General: No edema.  Lymphadenopathy:    She has no cervical adenopathy.    She has no axillary adenopathy.       Right: No inguinal and no supraclavicular adenopathy present.       Left: No inguinal and no supraclavicular adenopathy present.  Neurological: She is alert and oriented to person, place, and time.  Skin: Skin is warm and dry. No rash noted. She is not diaphoretic. No erythema. No pallor.  Psychiatric: She has a normal mood and affect. Her behavior is normal. Judgment and thought content normal.  Nursing note and vitals reviewed.   Appointment on 12/17/2018  Component Date Value Ref Range Status  . Sodium 12/17/2018 137  135 - 145 mmol/L Final  . Potassium 12/17/2018 3.8  3.5 - 5.1 mmol/L Final  . Chloride 12/17/2018 104  98 - 111 mmol/L Final  . CO2 12/17/2018 24  22 - 32 mmol/L Final  .  Glucose, Bld 12/17/2018 123* 70 - 99 mg/dL Final  . BUN 12/17/2018 17  8 - 23 mg/dL Final  . Creatinine, Ser 12/17/2018 0.69  0.44 - 1.00 mg/dL Final  . Calcium 12/17/2018 8.8* 8.9 - 10.3 mg/dL Final  . Total Protein 12/17/2018 6.8  6.5 - 8.1 g/dL Final  . Albumin 12/17/2018 3.6  3.5 - 5.0 g/dL Final  . AST 12/17/2018 21  15 - 41 U/L Final  . ALT 12/17/2018 18  0 - 44 U/L Final  . Alkaline Phosphatase 12/17/2018 51  38 - 126 U/L Final  . Total Bilirubin 12/17/2018 0.9  0.3 - 1.2 mg/dL Final  . GFR calc non Af Amer 12/17/2018 >60  >60 mL/min Final  . GFR calc Af Amer 12/17/2018 >60  >60 mL/min Final  . Anion gap 12/17/2018 9  5 - 15 Final   Performed at [facility], [address], [place], [place] [address]  . WBC 12/17/2018 8.0  4.0 - 10.5 K/uL Final  . RBC 12/17/2018 4.20  3.87 - 5.11 MIL/uL Final  . Hemoglobin 12/17/2018 13.2  12.0 - 15.0 g/dL Final  . HCT 12/17/2018 39.1  36.0 - 46.0 % Final  . MCV 12/17/2018 93.1  80.0 - 100.0 fL Final  . MCH 12/17/2018 31.4  26.0 - 34.0 pg Final  . MCHC 12/17/2018 33.8  30.0 - 36.0 g/dL Final  . RDW 12/17/2018 12.1  11.5 - 15.5 % Final  . Platelets 12/17/2018 237  150 - 400 K/uL Final  . nRBC 12/17/2018 0.0  0.0 - 0.2 % Final  . Neutrophils Relative % 12/17/2018 66  % Final  . Neutro Abs 12/17/2018 5.2  1.7 - 7.7 K/uL Final  . Lymphocytes Relative 12/17/2018 28  % Final  . Lymphs Abs 12/17/2018 2.3  0.7 - 4.0 K/uL Final  . Monocytes Relative 12/17/2018 6  % Final  . Monocytes Absolute 12/17/2018 0.5  0.1 - 1.0 K/uL Final  . Eosinophils Relative 12/17/2018 0  % Final  . Eosinophils Absolute 12/17/2018 0.0  0.0 - 0.5 K/uL Final  . Basophils Relative 12/17/2018 0  % Final  . Basophils Absolute 12/17/2018 0.0  0.0 - 0.1 K/uL Final  . Immature Granulocytes 12/17/2018 0  %  Final  . Abs Immature Granulocytes 12/17/2018 0.03  0.00 - 0.07 K/uL Final   Performed at [facility], [address], [place], [place] [address]     Assessment:  CHANEY INGRAM is a 78 y.o. adult with stage I Her2/neu+ left breast cancer s/p lumpectomy and sentinel lymph node biopsy on  02/19/2008.  No pathology is available. Notes indicate pathologic stage [id number].  Tumor was ER positive, PR positive and HER-2/neu 3+.  BCI testing on 07/06/2015 revealed a high risk of late recurrence 8.1% (CI 3.3%-12.6%) at years 5-10 with a high likelihood of benefit of extended adjuvant hormonal therapy.  She received 6 cycles of Taxotere, carboplatin, and Herceptin ([place])  6 cycles (03/16/2008 - 06/29/2008). She received Herceptin every 3 weeks (last cycle 02/15/2009).  She received radiation. She began letrozole (Femara).  She was off therapy for only about 2-3 months.  CA27.29 has been followed: 29.4 on 07/11/2010, 21.2 on 07/24/2011, 23.1 on 02/06/2012, 21.3 on 03/20/2013, 26.0 on 04/30/2014, 40.6 on 04/28/2015, 32.6 on 06/21/2015, 34.7 on 10/29/2016, 28.5 on 05/08/2017, 37.8 on 11/06/2017, 35.9 on 06/18/2018, and 33.5 on 12/17/2018.  Bilateral diagnostic mammogram on 05/30/2016 revealed no evidence of malignancy (ordered by Dr. Jamal Collin). Bilateral screening mammogram on 06/03/2017  demonstrated no mammographic evidence of malignancy.   Bone density on 05/19/2014 was normal with a T score of 0.3 in the left femur.  Bone density on 05/30/2016 was normal with a T-score of -0.1 in the AP spine (L1-L2).  Bone density on 06/11/2018 revealed osteoporosis with a T-score of -2.6 in the forearm radius.    Symptomatically, she is doing well.  Exam is stable.  CA27.29 is normal.  Plan: 1.   Labs today:  CBC with diff, CMP, CA27.29.  2.   Stage I Her2/neu+ left breast cancer Clinically she is doing well. She is nearing the end of 10 years of adjuvant endocrine therapy. Records previously regarding start date of endocrine therapy. Continue Femara for now.  Contact patient with stop date. Schedule mammogram on 06/12/2019. Discuss ongoing annual follow-up.    Patient wishes to continue follow-up in clinic. 3.   Osteoporosis Continue calcium and vitamin D. Follow-up with Dr. Sanda Klein regarding treatment  Fosamax, Boniva, Actonel or Prolia. Discuss potential side effects and need for dental clearance. 4.   RTC in 1 year for MD assessment ans labs (CBC with diff, CMP, CA27.29).   I discussed the assessment and treatment plan with the patient.  The patient was provided an opportunity to ask questions and all were answered.  The patient agreed with the plan and demonstrated an understanding of the instructions.  The patient was advised to call back if the symptoms worsen or if the condition fails to improve as anticipated.  I provided 20 minutes of face-to-face time during this this encounter and > 50% was spent counseling as documented under my assessment and plan.    Lequita Asal, MD, PhD    12/17/2018, 11:00 AM  I, Molly Dorshimer, am acting as Education administrator for [employer]. Mike Gip, MD, PhD.  I, Melissa C. Mike Gip, MD, have reviewed the above documentation for accuracy and completeness, and I agree with the above.

## 2018-12-17 ENCOUNTER — Inpatient Hospital Stay: Payer: Medicare Other | Attending: Hematology and Oncology

## 2018-12-17 ENCOUNTER — Encounter: Payer: Self-pay | Admitting: Hematology and Oncology

## 2018-12-17 ENCOUNTER — Inpatient Hospital Stay (HOSPITAL_BASED_OUTPATIENT_CLINIC_OR_DEPARTMENT_OTHER): Payer: Medicare Other | Admitting: Hematology and Oncology

## 2018-12-17 VITALS — BP 132/77 | HR 78 | Temp 97.9°F | Resp 18 | Ht 66.0 in | Wt 162.8 lb

## 2018-12-17 DIAGNOSIS — C50212 Malignant neoplasm of upper-inner quadrant of left female breast: Secondary | ICD-10-CM

## 2018-12-17 DIAGNOSIS — Z87891 Personal history of nicotine dependence: Secondary | ICD-10-CM | POA: Insufficient documentation

## 2018-12-17 DIAGNOSIS — E785 Hyperlipidemia, unspecified: Secondary | ICD-10-CM | POA: Insufficient documentation

## 2018-12-17 DIAGNOSIS — Z79899 Other long term (current) drug therapy: Secondary | ICD-10-CM | POA: Insufficient documentation

## 2018-12-17 DIAGNOSIS — Z79811 Long term (current) use of aromatase inhibitors: Secondary | ICD-10-CM | POA: Diagnosis not present

## 2018-12-17 DIAGNOSIS — Z7982 Long term (current) use of aspirin: Secondary | ICD-10-CM

## 2018-12-17 DIAGNOSIS — Z170 Estrogen receptor positive status [ER+]: Secondary | ICD-10-CM

## 2018-12-17 DIAGNOSIS — M816 Localized osteoporosis [Lequesne]: Secondary | ICD-10-CM

## 2018-12-17 LAB — CBC WITH DIFFERENTIAL/PLATELET
Abs Immature Granulocytes: 0.03 10*3/uL (ref 0.00–0.07)
Basophils Absolute: 0 10*3/uL (ref 0.0–0.1)
Basophils Relative: 0 %
Eosinophils Absolute: 0 10*3/uL (ref 0.0–0.5)
Eosinophils Relative: 0 %
HCT: 39.1 % (ref 36.0–46.0)
Hemoglobin: 13.2 g/dL (ref 12.0–15.0)
Immature Granulocytes: 0 %
Lymphocytes Relative: 28 %
Lymphs Abs: 2.3 10*3/uL (ref 0.7–4.0)
MCH: 31.4 pg (ref 26.0–34.0)
MCHC: 33.8 g/dL (ref 30.0–36.0)
MCV: 93.1 fL (ref 80.0–100.0)
Monocytes Absolute: 0.5 10*3/uL (ref 0.1–1.0)
Monocytes Relative: 6 %
Neutro Abs: 5.2 10*3/uL (ref 1.7–7.7)
Neutrophils Relative %: 66 %
Platelets: 237 10*3/uL (ref 150–400)
RBC: 4.2 MIL/uL (ref 3.87–5.11)
RDW: 12.1 % (ref 11.5–15.5)
WBC: 8 10*3/uL (ref 4.0–10.5)
nRBC: 0 % (ref 0.0–0.2)

## 2018-12-17 LAB — COMPREHENSIVE METABOLIC PANEL
ALT: 18 U/L (ref 0–44)
AST: 21 U/L (ref 15–41)
Albumin: 3.6 g/dL (ref 3.5–5.0)
Alkaline Phosphatase: 51 U/L (ref 38–126)
Anion gap: 9 (ref 5–15)
BUN: 17 mg/dL (ref 8–23)
CO2: 24 mmol/L (ref 22–32)
Calcium: 8.8 mg/dL — ABNORMAL LOW (ref 8.9–10.3)
Chloride: 104 mmol/L (ref 98–111)
Creatinine, Ser: 0.69 mg/dL (ref 0.44–1.00)
GFR calc Af Amer: >60 mL/min (ref >60)
GFR calc non Af Amer: >60 mL/min (ref >60)
Glucose, Bld: 123 mg/dL — ABNORMAL HIGH (ref 70–99)
Potassium: 3.8 mmol/L (ref 3.5–5.1)
Sodium: 137 mmol/L (ref 135–145)
Total Bilirubin: 0.9 mg/dL (ref 0.3–1.2)
Total Protein: 6.8 g/dL (ref 6.5–8.1)

## 2018-12-17 NOTE — Progress Notes (Signed)
No new changes noted today 

## 2018-12-18 LAB — CANCER ANTIGEN 27.29: CA 27.29: 33.5 U/mL (ref 0.0–38.6)

## 2018-12-22 ENCOUNTER — Telehealth: Payer: Self-pay

## 2018-12-22 NOTE — Telephone Encounter (Signed)
Informed patient last radiation date was 09/16/2008. Patient has been on Letrozole past the 10 year mark at this point and per Dr. Mike Gip is okay to discontinue taking this medication. Patient reports she has 1.5 week supply left and will finish this off and then discontinue. Patient reports she will return for her yearly check up and will call back should she have any symptoms prior to. Patient denies any further questions.

## 2019-01-21 ENCOUNTER — Ambulatory Visit: Admission: RE | Admit: 2019-01-21 | Payer: Medicare Other | Source: Home / Self Care | Admitting: Internal Medicine

## 2019-01-21 ENCOUNTER — Encounter: Admission: RE | Payer: Self-pay | Source: Home / Self Care

## 2019-01-21 SURGERY — COLONOSCOPY WITH PROPOFOL
Anesthesia: General

## 2019-02-23 ENCOUNTER — Ambulatory Visit (INDEPENDENT_AMBULATORY_CARE_PROVIDER_SITE_OTHER): Payer: Medicare Other | Admitting: Nurse Practitioner

## 2019-02-23 ENCOUNTER — Encounter: Payer: Self-pay | Admitting: Nurse Practitioner

## 2019-02-23 ENCOUNTER — Other Ambulatory Visit: Payer: Self-pay

## 2019-02-23 VITALS — BP 120/64 | HR 76 | Temp 96.9°F | Resp 14 | Ht 65.0 in | Wt 161.4 lb

## 2019-02-23 DIAGNOSIS — Z0000 Encounter for general adult medical examination without abnormal findings: Secondary | ICD-10-CM

## 2019-02-23 DIAGNOSIS — E785 Hyperlipidemia, unspecified: Secondary | ICD-10-CM

## 2019-02-23 DIAGNOSIS — M816 Localized osteoporosis [Lequesne]: Secondary | ICD-10-CM

## 2019-02-23 DIAGNOSIS — I10 Essential (primary) hypertension: Secondary | ICD-10-CM

## 2019-02-23 NOTE — Progress Notes (Signed)
Name: Toni Callahan   MRN: [id number]    DOB: July 23, 1941   Date:02/23/2019       Progress Note  Subjective  Chief Complaint  Chief Complaint  Patient presents with  . Annual Exam    HPI  Patient presents for annual CPE.  Diet:  3 meals a day Breakfast- cereal with low-fat milk and fruit Lunch- BLT or peanut butter and jelly Dinner- salads, protein and veggies.  Drink- diet green tea.  Exercise:  [place] every week and does a lot of gardening and cleans house daily.  Husband cannot do much  USPSTF grade A and B recommendations    Office Visit from 02/23/2019 in [facility]  AUDIT-C Score  1     Depression: Phq 9 is  negative Depression screen [facility] 2/9 02/23/2019 09/18/2018 09/11/2017 05/07/2017 03/11/2017  Decreased Interest 0 0 0 0 0  Down, Depressed, Hopeless 0 0 0 0 0  PHQ - 2 Score 0 0 0 0 0  Altered sleeping 0 0 - - -  Tired, decreased energy 0 0 - - -  Change in appetite 0 0 - - -  Feeling bad or failure about yourself  0 0 - - -  Trouble concentrating 0 0 - - -  Moving slowly or fidgety/restless 0 0 - - -  Suicidal thoughts 0 0 - - -  PHQ-9 Score 0 0 - - -  Difficult doing work/chores Not difficult at all Not difficult at all - - -   Hypertension: BP Readings from Last 3 Encounters:  02/23/19 120/64  12/17/18 132/77  09/18/18 136/72   Obesity: Wt Readings from Last 3 Encounters:  02/23/19 161 lb 6.4 oz (73.2 kg)  12/17/18 162 lb 13 oz (73.9 kg)  09/18/18 160 lb 12.8 oz (72.9 kg)   BMI Readings from Last 3 Encounters:  02/23/19 26.86 kg/m  12/17/18 26.28 kg/m  09/18/18 25.95 kg/m    Hep C Screening: denies  STD testing and prevention (HIV/chl/gon/syphilis): denies  Intimate partner violence: denies  Menstrual History/LMP/Abnormal Bleeding: denies  Incontinence Symptoms: denies   Advanced Care Planning: A voluntary discussion about advance care planning including the explanation and discussion of advance directives.   Discussed health care proxy and Living will, and the patient was able to identify a health care proxy as husband- Toni Callahan.  Patient does have a living will at present time. If patient does have living will, I have requested they bring this to the clinic to be scanned in to their chart.  Breast cancer: mammogram due 06/2019- ordered by Dr. Mike Gip- follows due to history of left breast cancer No results found for: [facility]    Osteoporosis Screening: osteopororis of left forearm, femur normal.  States she has had gum issues in the past and lost some teeth because of that more than 40 years ago so she has a lot of implants but has not had any dental issues in the last 10 years or so.  No results found for: HMDEXASCAN  Lipids:  Lab Results  Component Value Date   CHOL 168 07/24/2018   CHOL 208 (H) 09/11/2017   CHOL 185 03/11/2017   Lab Results  Component Value Date   HDL 35 (L) 07/24/2018   HDL 32 (L) 09/11/2017   HDL 34 (L) 03/11/2017   Lab Results  Component Value Date   LDLCALC 96 07/24/2018   LDLCALC 139 (H) 09/11/2017   LDLCALC 109 (H) 03/11/2017   Lab Results  Component Value Date   TRIG 261 (H) 07/24/2018   TRIG 227 (H) 09/11/2017   TRIG 209 (H) 03/11/2017   Lab Results  Component Value Date   CHOLHDL 4.8 07/24/2018   CHOLHDL 6.5 (H) 09/11/2017   CHOLHDL 5.4 (H) 03/11/2017   No results found for: LDLDIRECT  Glucose:  Glucose  Date Value Ref Range Status  04/30/2014 90 65 - 99 mg/dL Final  10/16/2013 109 (H) 65 - 99 mg/dL Final  03/20/2013 96 65 - 99 mg/dL Final   Glucose, Bld  Date Value Ref Range Status  12/17/2018 123 (H) 70 - 99 mg/dL Final  06/18/2018 101 (H) 70 - 99 mg/dL Final  11/06/2017 94 65 - 99 mg/dL Final    Skin cancer: discussed Colorectal cancer: due for colonoscopy- plans to reschedule later this year due to pandemic  Lung cancer:  Low Dose CT Chest recommended if Age 21-80 years, 30 pack-year currently smoking OR have quit  w/in 15years. Patient does not qualify.     Patient Active Problem List   Diagnosis Date Noted  . Localized osteoporosis without current pathological fracture 06/18/2018  . Medicare annual wellness visit, subsequent 09/11/2017  . Colon cancer screening 09/11/2017  . Hypertriglyceridemia 09/10/2016  . 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 Counseling regarding end of life decision making 09/18/2015  . Preventative health care 09/18/2015  . Medication monitoring encounter 09/18/2015  . Hyperglycemia 09/18/2015  . Postmenopausal 09/16/2015  . Screening for osteoporosis 09/16/2015  . Loose stools 07/26/2015  . Other fecal abnormalities 07/26/2015  . Hyperlipidemia LDL goal <100 04/28/2015  . Supraventricular tachycardia ([place]) 04/28/2015  . Abdominal pain 04/28/2015  . Benign essential HTN 09/16/2014  . TI (tricuspid incompetence) 09/16/2014  . 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 Presence of intraocular lens 09/10/2014  . Breast cancer of upper-inner quadrant of left female breast ([place]) 06/03/2014  . Breathlessness on exertion 05/17/2014    Past Surgical History:  Procedure Laterality Date  . BREAST BIOPSY Left 2009   positive  . BREAST CYST ASPIRATION Bilateral 2005   neg  . BREAST LUMPECTOMY Left 2009   positive  . BREAST SURGERY Left 2009   lumpectomy  . CATARACT EXTRACTION W/ INTRAOCULAR LENS IMPLANT Bilateral   . COLONOSCOPY  [id number]   Dr. Candace Cruise  . COLONOSCOPY N/A 09/07/2015   Procedure: COLONOSCOPY;  Surgeon: Hulen Luster, MD;  Location: [place];  Service: Gastroenterology;  Laterality: N/A;  . POLYPECTOMY  09/07/2015   Procedure: POLYPECTOMY;  Surgeon: Hulen Luster, MD;  Location: [place];  Service: Gastroenterology;;  . PORT-A-CATH REMOVAL  2011  . PORTACATH PLACEMENT  2009  . TONSILLECTOMY      Family History  Problem Relation Age of Onset  . Stroke Mother   . Heart disease Father   . Diabetes  Brother   . Heart disease Paternal Grandmother   . Cancer Neg Hx   . COPD Neg Hx   . Hypertension Neg Hx   . Breast cancer Neg Hx     Social History   Socioeconomic History  . Marital status: Married    Spouse name: Not on file  . Number of children: 0  . Years of education: Not on file  . Highest education level: Bachelor's degree (e.g., BA, AB, BS)  Occupational History  . Not on file  Social Needs  . Financial resource strain: Not hard at all  . Food insecurity    Worry: Never true  Inability: Never true  . Transportation needs    Medical: No    Non-medical: No  Tobacco Use  . Smoking status: Former Smoker    Years: 20.00    Types: Cigarettes    Quit date: 08/06/1984    Years since quitting: 34.5  . Smokeless tobacco: Never Used  . Tobacco comment: quit in 1986 approximately  Substance and Sexual Activity  . Alcohol use: Yes    Comment: drinks rarely  . Drug use: No  . Sexual activity: Yes  Lifestyle  . Physical activity    Days per week: 2 days    Minutes per session: 50 min  . Stress: Not at all  Relationships  . Social connections    Talks on phone: More than three times a week    Gets together: Three times a week    Attends religious service: More than 4 times per year    Active member of club or organization: No    Attends meetings of clubs or organizations: Never    Relationship status: Married  . Intimate partner violence    Fear of current or ex partner: No    Emotionally abused: No    Physically abused: No    Forced sexual activity: No  Other Topics Concern  . Not on file  Social History Narrative  . Not on file     Current Outpatient Medications:  .  aspirin 81 MG tablet, Take 81 mg by mouth daily., Disp: , Rfl:  .  atorvastatin (LIPITOR) 10 MG tablet, One by mouth at bedtime three nights a week, Disp: 36 tablet, Rfl: 3 .  Biotin 5000 MCG CAPS, Take 1 capsule by mouth daily., Disp: , Rfl:  .  Calcium Carbonate (CALCIUM 600 PO), Take 1  tablet by mouth every other day. , Disp: , Rfl:  .  Cholecalciferol (VITAMIN D PO), Take 1,000 Int'l Units by mouth daily., Disp: , Rfl:  .  Cyanocobalamin (VITAMIN B 12 PO), Take 500 mg by mouth daily., Disp: , Rfl:  .  folic acid (FOLVITE) 500 MCG tablet, Take 400 mcg by mouth daily., Disp: , Rfl:  .  Magnesium 250 MG TABS, Take 1 tablet by mouth daily., Disp: , Rfl:  .  metoprolol succinate (TOPROL-XL) 50 MG 24 hr tablet, Take 1 tablet (50 mg total) by mouth daily., Disp: 30 tablet, Rfl: 11 .  Multiple Vitamin (MULTIVITAMIN) tablet, Take 1 tablet by mouth daily., Disp: , Rfl:  .  Multiple Vitamins-Minerals (ZINC PO), Take 1 capsule by mouth daily., Disp: , Rfl:  .  Omega-3 Fatty Acids (FISH OIL) 1000 MG CAPS, Take 1 capsule by mouth every other day. , Disp: , Rfl:  .  Potassium Gluconate 595 MG CAPS, Take by mouth daily., Disp: , Rfl:  .  vitamin C (ASCORBIC ACID) 500 MG tablet, Take 500 mg by mouth every other day. , Disp: , Rfl:  .  letrozole (FEMARA) 2.5 MG tablet, Take 1 tablet (2.5 mg total) by mouth daily. (Patient not taking: Reported on 02/23/2019), Disp: 90 tablet, Rfl: 0  Allergies  Allergen Reactions  . Sulfa Antibiotics Nausea Only and Other (See Comments)    Upset stomach     Review of Systems  Constitutional: Negative for chills, fever and malaise/fatigue.  HENT: Negative for congestion, sinus pain and sore throat.   Eyes: Negative for blurred vision.  Respiratory: Negative for cough and shortness of breath.   Cardiovascular: Negative for chest pain, palpitations and leg swelling.  Gastrointestinal: Negative for abdominal pain, blood in stool, constipation, diarrhea and nausea.  Genitourinary: Negative for dysuria and hematuria.  Musculoskeletal: Negative for falls and joint pain.  Skin: Negative for rash.  Neurological: Negative for dizziness, tingling, weakness and headaches.  Endo/Heme/Allergies: Negative for polydipsia.  Psychiatric/Behavioral: The patient is not  nervous/anxious and does not have insomnia.      Objective  Vitals:   02/23/19 0921  BP: 120/64  Pulse: 76  Resp: 14  Temp: (!) 96.9 F (36.1 C)  TempSrc: Temporal  SpO2: 97%  Weight: 161 lb 6.4 oz (73.2 kg)  Height: 5\' 5"  (1.651 m)    Body mass index is 26.86 kg/m.  Physical Exam Constitutional: Patient appears well-developed and well-nourished. No distress.  HENT: Head: Normocephalic and atraumatic. Ears: B TMs ok, no erythema or effusion; had significant dental work and multiple implants, no rot or decay noted.  Eyes: Conjunctivae and EOM are normal. Pupils are equal, round, and reactive to light. No scleral icterus.  Neck: Normal range of motion. Neck supple. No JVD present. No thyromegaly present.  Cardiovascular: Normal rate, regular rhythm and normal heart sounds.  No murmur heard. No BLE edema. Pulmonary/Chest: Effort normal and breath sounds normal. No respiratory distress. Abdominal: Soft. Bowel sounds are normal, no distension. There is no tenderness. no masses Breast: deferred, patient states goes to onc- does not want today  FEMALE GENITALIA: deferred Musculoskeletal: Normal range of motion, no joint effusions. No gross deformities Neurological: he is alert and oriented to person, place, and time. No cranial nerve deficit. Coordination, balance, strength, speech and gait are normal.  Skin: Skin is warm and dry. No rash noted. No erythema.  Psychiatric: Patient has a normal mood and affect. behavior is normal. Judgment and thought content normal.    Recent Results (from the past 2160 hour(s))  Cancer antigen 27.29     Status: None   Collection Time: 12/17/18 10:09 AM  Result Value Ref Range   CA 27.29 33.5 0.0 - 38.6 U/mL    Comment: (NOTE) Siemens Centaur Immunochemiluminometric Methodology (ICMA) Values obtained with different assay methods or kits cannot be used interchangeably. Results cannot be interpreted as absolute evidence of the presence or  absence of malignant disease. Performed At: [facility] [place], [place] [id number] Rush Farmer MD PI:[id number]   Comprehensive metabolic panel     Status: Abnormal   Collection Time: 12/17/18 10:09 AM  Result Value Ref Range   Sodium 137 135 - 145 mmol/L   Potassium 3.8 3.5 - 5.1 mmol/L   Chloride 104 98 - 111 mmol/L   CO2 24 22 - 32 mmol/L   Glucose, Bld 123 (H) 70 - 99 mg/dL   BUN 17 8 - 23 mg/dL   Creatinine, Ser 0.69 0.44 - 1.00 mg/dL   Calcium 8.8 (L) 8.9 - 10.3 mg/dL   Total Protein 6.8 6.5 - 8.1 g/dL   Albumin 3.6 3.5 - 5.0 g/dL   AST 21 15 - 41 U/L   ALT 18 0 - 44 U/L   Alkaline Phosphatase 51 38 - 126 U/L   Total Bilirubin 0.9 0.3 - 1.2 mg/dL   GFR calc non Af Amer >60 >60 mL/min   GFR calc Af Amer >60 >60 mL/min   Anion gap 9 5 - 15    Comment: Performed at [facility], [address], [place], [place] [address]  CBC with Differential     Status: None   Collection Time: 12/17/18 10:09  AM  Result Value Ref Range   WBC 8.0 4.0 - 10.5 K/uL   RBC 4.20 3.87 - 5.11 MIL/uL   Hemoglobin 13.2 12.0 - 15.0 g/dL   HCT 39.1 36.0 - 46.0 %   MCV 93.1 80.0 - 100.0 fL   MCH 31.4 26.0 - 34.0 pg   MCHC 33.8 30.0 - 36.0 g/dL   RDW 12.1 11.5 - 15.5 %   Platelets 237 150 - 400 K/uL   nRBC 0.0 0.0 - 0.2 %   Neutrophils Relative % 66 %   Neutro Abs 5.2 1.7 - 7.7 K/uL   Lymphocytes Relative 28 %   Lymphs Abs 2.3 0.7 - 4.0 K/uL   Monocytes Relative 6 %   Monocytes Absolute 0.5 0.1 - 1.0 K/uL   Eosinophils Relative 0 %   Eosinophils Absolute 0.0 0.0 - 0.5 K/uL   Basophils Relative 0 %   Basophils Absolute 0.0 0.0 - 0.1 K/uL   Immature Granulocytes 0 %   Abs Immature Granulocytes 0.03 0.00 - 0.07 K/uL    Comment: Performed at [facility], [address] [place], [place] [address]     PHQ2/9: Depression screen [facility] 2/9 02/23/2019 09/18/2018 09/11/2017 05/07/2017 03/11/2017  Decreased Interest 0 0 0 0 0  Down, Depressed, Hopeless  0 0 0 0 0  PHQ - 2 Score 0 0 0 0 0  Altered sleeping 0 0 - - -  Tired, decreased energy 0 0 - - -  Change in appetite 0 0 - - -  Feeling bad or failure about yourself  0 0 - - -  Trouble concentrating 0 0 - - -  Moving slowly or fidgety/restless 0 0 - - -  Suicidal thoughts 0 0 - - -  PHQ-9 Score 0 0 - - -  Difficult doing work/chores Not difficult at all Not difficult at all - - -     Fall Risk: Fall Risk  02/23/2019 09/18/2018 06/12/2018 09/11/2017 05/07/2017  Falls in the past year? 0 0 0 No No  Number falls in past yr: 0 0 - - -  Injury with Fall? 0 0 - - -  Follow up - Falls prevention discussed - - -   Functional Status Survey: Is the patient deaf or have difficulty hearing?: No Does the patient have difficulty seeing, even when wearing glasses/contacts?: No Does the patient have difficulty concentrating, remembering, or making decisions?: No Does the patient have difficulty walking or climbing stairs?: No Does the patient have difficulty dressing or bathing?: No Does the patient have difficulty doing errands alone such as visiting a doctor's office or shopping?: No   Assessment & Plan  1. Preventative health care Discussed preventive measures  2. Hyperlipidemia LDL goal <100 Continue meds - Lipid Profile  3. Benign essential HTN stable - Lipid Profile  4. Localized osteoporosis without current pathological fracture Discussed fosamax, due to extensive dental work history will be cleared by dentist and then call us to start if cleared, if not will refer to endo for prolia or consideration of other options.    -USPSTF grade A and B recommendations reviewed with patient; age-appropriate recommendations, preventive care, screening tests, etc discussed and encouraged; healthy living encouraged; see AVS for patient education given to patient -Discussed importance of 150 minutes of physical activity weekly, eat two servings of fish weekly, eat one serving of tree nuts (  cashews, pistachios, pecans, almonds.Marland Kitchen) every other day, eat 6 servings of fruit/vegetables daily and drink plenty of  water and avoid sweet beverages.

## 2019-02-23 NOTE — Patient Instructions (Addendum)
-   Plan to schedule your colonoscopy sometime this year.    - We are planning on starting you on fosamax once a week but please talk to your dentist to ensure we can start you on this for osteoporosis. Please call us once you talk to your dentist so we can get you started.   Good cholesterol, also called high-density lipoprotein (HDL) removes extra cholesterol and plaque buildup in your arteries and then sends it to your liver to get rid of and helps reduce your risk of heart disease, heart attack, and stroke.Foods that increase HDL: beans and legumes, whole grains, high-fiber fruits:prunes, apples, and pears; fatty fish- salmon, tuna, sardines; nuts, olive oil    Bisphosphonates are medications that slow the breakdown and removal of bone (ie, resorption). They are widely used for the prevention and treatment of osteoporosis in postmenopausal women.  These drugs need to be taken first thing in the morning on an empty stomach with a full 8 oz glass of plain (not sparkling) water. You then need to wait for a half hour or an hour, depending on which one you take, before eating or taking any other medications:  ?Alendronate (brand name: Fosamax) or risedronate (brand names: Actonel, Atelvia) - If you take either of these drugs, wait at least half an hour. (See 'Risedronate' below and 'Ibandronate' below.) Most people who take bisphosphonates do not have any serious side effects related to the medication. However, it is important to closely follow the instructions for taking the medication; lying down or eating sooner than the recommended time after a dose increases the risk of stomach upset.  There has been concern about use of bisphosphonates in people who require invasive dental work

## 2019-02-24 ENCOUNTER — Encounter: Payer: Self-pay | Admitting: General Surgery

## 2019-02-24 LAB — LIPID PANEL
Cholesterol: 162 mg/dL (ref <200)
HDL: 42 mg/dL — ABNORMAL LOW (ref >=50)
LDL Cholesterol (Calc): 91 mg/dL (calc)
Non-HDL Cholesterol (Calc): 120 mg/dL (calc) (ref <130)
Total CHOL/HDL Ratio: 3.9 (calc) (ref <5.0)
Triglycerides: 191 mg/dL — ABNORMAL HIGH (ref <150)

## 2019-02-26 ENCOUNTER — Telehealth: Payer: Self-pay | Admitting: Family Medicine

## 2019-02-26 DIAGNOSIS — M816 Localized osteoporosis [Lequesne]: Secondary | ICD-10-CM

## 2019-02-26 NOTE — Telephone Encounter (Signed)
Copied from [place] [id number]. Topic: Quick Communication - See Telephone Encounter >> Feb 26, 2019  3:09 PM Nils Flack wrote: CRM for notification. See Telephone encounter for: 02/26/19. Geraldine Contras DDS called - he was told to call in regarding pt taking Fosamax.  He is ok with her taking Fosamax and says that the pt's dental condition is stable.  Please call if any questions [phone]

## 2019-03-02 MED ORDER — ALENDRONATE SODIUM 70 MG PO TABS: 70.0000 mg | ORAL_TABLET | ORAL | 3 refills | Status: DC

## 2019-03-02 NOTE — Telephone Encounter (Signed)
Sent meds to [facility]

## 2019-03-02 NOTE — Telephone Encounter (Signed)
Called patient home and cell, unable to leave voicemail, will try again later.

## 2019-03-03 NOTE — Telephone Encounter (Signed)
Called patient again, unable to reach, CRM created.

## 2019-03-09 ENCOUNTER — Telehealth: Payer: Self-pay | Admitting: Nurse Practitioner

## 2019-03-09 NOTE — Telephone Encounter (Signed)
Pt called stating she did check with her dentist and they approved her taking fosamax. Please advise.   [place], [place] - [address]  [place] [address]  Phone: [phone] Fax: [phone]  Not a 24 hour pharmacy; exact hours not known.

## 2019-03-09 NOTE — Telephone Encounter (Signed)
Fosamax called into pharmacy and patient notified.

## 2019-06-11 ENCOUNTER — Other Ambulatory Visit
Admission: RE | Admit: 2019-06-11 | Discharge: 2019-06-11 | Disposition: A | Discharge status: Home / Self Care | Payer: Medicare Other | Source: Ambulatory Visit | Attending: Internal Medicine | Admitting: Internal Medicine

## 2019-06-11 ENCOUNTER — Other Ambulatory Visit: Payer: Self-pay

## 2019-06-11 DIAGNOSIS — Z20828 Contact with and (suspected) exposure to other viral communicable diseases: Secondary | ICD-10-CM | POA: Insufficient documentation

## 2019-06-11 DIAGNOSIS — Z01812 Encounter for preprocedural laboratory examination: Secondary | ICD-10-CM | POA: Diagnosis present

## 2019-06-11 LAB — SARS CORONAVIRUS 2 (TAT 6-24 HRS): SARS Coronavirus 2: NEGATIVE

## 2019-06-12 ENCOUNTER — Encounter: Payer: Self-pay | Admitting: *Deleted

## 2019-06-15 ENCOUNTER — Other Ambulatory Visit: Payer: Self-pay

## 2019-06-15 ENCOUNTER — Ambulatory Visit
Admission: RE | Admit: 2019-06-15 | Discharge: 2019-06-15 | Disposition: A | Discharge status: Home / Self Care | Payer: Medicare Other | Attending: Internal Medicine | Admitting: Internal Medicine

## 2019-06-15 ENCOUNTER — Ambulatory Visit: Payer: Medicare Other | Admitting: Anesthesiology

## 2019-06-15 ENCOUNTER — Encounter
Admission: RE | Disposition: A | Discharge status: Home / Self Care | Payer: Self-pay | Source: Home / Self Care | Attending: Internal Medicine

## 2019-06-15 DIAGNOSIS — I10 Essential (primary) hypertension: Secondary | ICD-10-CM | POA: Insufficient documentation

## 2019-06-15 DIAGNOSIS — Z86010 Personal history of colonic polyps: Secondary | ICD-10-CM | POA: Diagnosis not present

## 2019-06-15 DIAGNOSIS — Z09 Encounter for follow-up examination after completed treatment for conditions other than malignant neoplasm: Secondary | ICD-10-CM | POA: Insufficient documentation

## 2019-06-15 DIAGNOSIS — Z9221 Personal history of antineoplastic chemotherapy: Secondary | ICD-10-CM | POA: Insufficient documentation

## 2019-06-15 DIAGNOSIS — E785 Hyperlipidemia, unspecified: Secondary | ICD-10-CM | POA: Insufficient documentation

## 2019-06-15 DIAGNOSIS — Z7983 Long term (current) use of bisphosphonates: Secondary | ICD-10-CM | POA: Diagnosis not present

## 2019-06-15 DIAGNOSIS — Z923 Personal history of irradiation: Secondary | ICD-10-CM | POA: Insufficient documentation

## 2019-06-15 DIAGNOSIS — Z7982 Long term (current) use of aspirin: Secondary | ICD-10-CM | POA: Insufficient documentation

## 2019-06-15 DIAGNOSIS — Z87891 Personal history of nicotine dependence: Secondary | ICD-10-CM | POA: Insufficient documentation

## 2019-06-15 DIAGNOSIS — K640 First degree hemorrhoids: Secondary | ICD-10-CM | POA: Diagnosis not present

## 2019-06-15 DIAGNOSIS — Z853 Personal history of malignant neoplasm of breast: Secondary | ICD-10-CM | POA: Insufficient documentation

## 2019-06-15 DIAGNOSIS — Z79899 Other long term (current) drug therapy: Secondary | ICD-10-CM | POA: Insufficient documentation

## 2019-06-15 DIAGNOSIS — D122 Benign neoplasm of ascending colon: Secondary | ICD-10-CM | POA: Diagnosis not present

## 2019-06-15 HISTORY — DX: Gastro-esophageal reflux disease without esophagitis: K21.9

## 2019-06-15 HISTORY — DX: Supraventricular tachycardia: I47.1

## 2019-06-15 HISTORY — PX: COLONOSCOPY WITH PROPOFOL: SHX5780

## 2019-06-15 HISTORY — DX: Benign neoplasm of colon, unspecified: D12.6

## 2019-06-15 HISTORY — DX: Supraventricular tachycardia, unspecified: I47.10

## 2019-06-15 LAB — HM COLONOSCOPY

## 2019-06-15 SURGERY — COLONOSCOPY WITH PROPOFOL
Anesthesia: General

## 2019-06-15 MED ORDER — PROPOFOL 500 MG/50ML IV EMUL
INTRAVENOUS | Status: DC | PRN
  Administered 2019-06-15: 50 ug/kg/min via INTRAVENOUS

## 2019-06-15 MED ORDER — LIDOCAINE HCL (PF) 2 % IJ SOLN
INTRAMUSCULAR | Status: AC
  Filled 2019-06-15: qty 10

## 2019-06-15 MED ORDER — FENTANYL CITRATE (PF) 100 MCG/2ML IJ SOLN
INTRAMUSCULAR | Status: AC
  Filled 2019-06-15: qty 2

## 2019-06-15 MED ORDER — PROPOFOL 10 MG/ML IV BOLUS
INTRAVENOUS | Status: DC | PRN
  Administered 2019-06-15 (×2): 20 mg via INTRAVENOUS
  Administered 2019-06-15: 10 mg via INTRAVENOUS
  Administered 2019-06-15 (×2): 20 mg via INTRAVENOUS

## 2019-06-15 MED ORDER — LIDOCAINE HCL (PF) 2 % IJ SOLN
INTRAMUSCULAR | Status: DC | PRN
  Administered 2019-06-15: 60 mg

## 2019-06-15 MED ORDER — FENTANYL CITRATE (PF) 100 MCG/2ML IJ SOLN
INTRAMUSCULAR | Status: DC | PRN
  Administered 2019-06-15 (×4): 25 ug via INTRAVENOUS

## 2019-06-15 MED ORDER — PROPOFOL 500 MG/50ML IV EMUL
INTRAVENOUS | Status: AC
  Filled 2019-06-15: qty 50

## 2019-06-15 MED ORDER — SODIUM CHLORIDE 0.9 % IV SOLN
INTRAVENOUS | Status: DC
  Administered 2019-06-15: 1000 mL via INTRAVENOUS

## 2019-06-15 NOTE — Op Note (Signed)
[facility] Patient Name: Toni Callahan Procedure Date: 06/15/2019 9:18 AM MRN: [id number] Account #: [id number] Date of Birth: 11/17/1940 Admit Type: Outpatient Age: 78 Room: [facility] ENDO ROOM 3 Gender: Female Note Status: Finalized Procedure:             Colonoscopy Indications:           Surveillance: Personal history of adenomatous polyps                         on last colonoscopy > 3 years ago Providers:             Lorie Apley K. Alice Reichert MD, MD Referring MD:          Arnetha Courser (Referring MD) Medicines:             Propofol per Anesthesia Complications:         No immediate complications. Procedure:             Pre-Anesthesia Assessment:                        - The risks and benefits of the procedure and the                         sedation options and risks were discussed with the                         patient. All questions were answered and informed                         consent was obtained.                        - Patient identification and proposed procedure were                         verified prior to the procedure by the nurse. The                         procedure was verified in the procedure room.                        - ASA Grade Assessment: III - A patient with severe                         systemic disease.                        - After reviewing the risks and benefits, the patient                         was deemed in satisfactory condition to undergo the                         procedure.                        After obtaining informed consent, the colonoscope was                         passed under direct vision.  Throughout the procedure,                         the patient's blood pressure, pulse, and oxygen                         saturations were monitored continuously. The                         Colonoscope was introduced through the anus and                         advanced to the the cecum, identified by  appendiceal                         orifice and ileocecal valve. The colonoscopy was                         performed without difficulty. The patient tolerated                         the procedure well. The quality of the bowel                         preparation was good. The ileocecal valve, appendiceal                         orifice, and rectum were photographed. Findings:      The perianal and digital rectal examinations were normal. Pertinent       negatives include normal sphincter tone and no palpable rectal lesions.      A 4 mm polyp was found in the ascending colon. The polyp was sessile.       The polyp was removed with a jumbo cold forceps. Resection and retrieval       were complete.      Non-bleeding internal hemorrhoids were found during retroflexion. The       hemorrhoids were Grade I (internal hemorrhoids that do not prolapse).      The exam was otherwise without abnormality. Impression:            - One 4 mm polyp in the ascending colon, removed with                         a jumbo cold forceps. Resected and retrieved.                        - Non-bleeding internal hemorrhoids.                        - The examination was otherwise normal. Recommendation:        - Patient has a contact number available for                         emergencies. The signs and symptoms of potential                         delayed complications were discussed with the patient.  Return to normal activities tomorrow. Written                         discharge instructions were provided to the patient.                        - Resume previous diet.                        - Continue present medications.                        - Await pathology results.                        - If polyps are benign or adenomatous without                         dysplasia, I will advise NO further colonoscopy due to                         advanced age and/or severe comorbidity.                         - Return to GI office PRN.                        - The findings and recommendations were discussed with                         the patient. Procedure Code(s):     --- Professional ---                        [phone], Colonoscopy, flexible; with biopsy, single or                         multiple Diagnosis Code(s):     --- Professional ---                        K64.0, First degree hemorrhoids                        K63.5, Polyp of colon                        Z86.010, Personal history of colonic polyps CPT copyright 2019 American Medical Association. All rights reserved. The codes documented in this report are preliminary and upon coder review may  be revised to meet current compliance requirements. Efrain Sella MD, MD 06/15/2019 9:41:47 AM This report has been signed electronically. Number of Addenda: 0 Note Initiated On: 06/15/2019 9:18 AM Scope Withdrawal Time: 0 hours 5 minutes 55 seconds  Total Procedure Duration: 0 hours 10 minutes 40 seconds  Estimated Blood Loss:  Estimated blood loss: none.      [facility]

## 2019-06-15 NOTE — Interval H&P Note (Signed)
History and Physical Interval Note:  06/15/2019 9:04 AM  Toni Callahan  has presented today for surgery, with the diagnosis of PERSONAL HX.OF COLON POLYPS.  The various methods of treatment have been discussed with the patient and family. After consideration of risks, benefits and other options for treatment, the patient has consented to  Procedure(s): COLONOSCOPY WITH PROPOFOL (N/A) as a surgical intervention.  The patient's history has been reviewed, patient examined, no change in status, stable for surgery.  I have reviewed the patient's chart and labs.  Questions were answered to the patient's satisfaction.     [place], [place]

## 2019-06-15 NOTE — Anesthesia Preprocedure Evaluation (Signed)
Anesthesia Evaluation  Patient identified by MRN, date of birth, ID band Patient awake    Reviewed: Allergy & Precautions, NPO status , Patient's Chart, lab work & pertinent test results  History of Anesthesia Complications Negative for: history of anesthetic complications  Airway Mallampati: II       Dental   Pulmonary neg sleep apnea, neg COPD, Not current smoker, former smoker,           Cardiovascular hypertension, Pt. on medications and Pt. on home beta blockers (-) Past MI and (-) CHF (-) dysrhythmias (-) Valvular Problems/Murmurs     Neuro/Psych neg Seizures    GI/Hepatic Neg liver ROS, neg GERD  ,  Endo/Other  neg diabetes  Renal/GU negative Renal ROS     Musculoskeletal   Abdominal   Peds  Hematology   Anesthesia Other Findings   Reproductive/Obstetrics                             Anesthesia Physical Anesthesia Plan  ASA: II  Anesthesia Plan: General   Post-op Pain Management:    Induction: Intravenous  PONV Risk Score and Plan: 3 and Treatment may vary due to age or medical condition, TIVA and Propofol infusion  Airway Management Planned: Nasal Cannula  Additional Equipment:   Intra-op Plan:   Post-operative Plan:   Informed Consent: I have reviewed the patients History and Physical, chart, labs and discussed the procedure including the risks, benefits and alternatives for the proposed anesthesia with the patient or authorized representative who has indicated his/her understanding and acceptance.       Plan Discussed with:   Anesthesia Plan Comments:         Anesthesia Quick Evaluation

## 2019-06-15 NOTE — Anesthesia Postprocedure Evaluation (Signed)
Anesthesia Post Note  Patient: Toni Callahan  Procedure(s) Performed: COLONOSCOPY WITH PROPOFOL (N/A )  Patient location during evaluation: Endoscopy Anesthesia Type: General Level of consciousness: awake and alert Pain management: pain level controlled Vital Signs Assessment: post-procedure vital signs reviewed and stable Respiratory status: spontaneous breathing and respiratory function stable Cardiovascular status: stable Anesthetic complications: no     Last Vitals:  Vitals:   06/15/19 0951 06/15/19 1001  BP: (!) 132/58 104/87  Pulse: 80 76  Resp: 14 17  Temp:    SpO2: 99% 99%    Last Pain:  Vitals:   06/15/19 1001  TempSrc:   PainSc: 0-No pain                 KEPHART,WILLIAM K

## 2019-06-15 NOTE — Transfer of Care (Signed)
Immediate Anesthesia Transfer of Care Note  Patient: Toni Callahan  Procedure(s) Performed: COLONOSCOPY WITH PROPOFOL (N/A )  Patient Location: PACU  Anesthesia Type:General  Level of Consciousness: sedated  Airway & Oxygen Therapy: Patient Spontanous Breathing and Patient connected to nasal cannula oxygen  Post-op Assessment: Report given to RN and Post -op Vital signs reviewed and stable  Post vital signs: Reviewed and stable  Last Vitals:  Vitals Value Taken Time  BP    Temp    Pulse 78 06/15/19 0941  Resp 19 06/15/19 0941  SpO2 96 % 06/15/19 0941  Vitals shown include unvalidated device data.  Last Pain:  Vitals:   06/15/19 0900  TempSrc: Tympanic  PainSc: 0-No pain         Complications: No apparent anesthesia complications

## 2019-06-15 NOTE — H&P (Signed)
Outpatient short stay form Pre-procedure 06/15/2019 9:02 AM Toni Callahan K. Alice Reichert, M.D.  Primary Physician: Enid Derry, M.D.  Reason for visit:  Personal hx of adenomatous colon polyp x 2 (colonoscopy 09/07/2015)  History of present illness:                            Patient presents for colonoscopy for a personal hx of colon polyps. The patient denies abdominal pain, abnormal weight loss or rectal bleeding.     Current Facility-Administered Medications:  .  0.9 %  sodium chloride infusion, , Intravenous, Continuous, Penni Penado, Benay Pike, MD  Medications Prior to Admission  Medication Sig Dispense Refill Last Dose  . alendronate (FOSAMAX) 70 MG tablet Take 1 tablet (70 mg total) by mouth every 7 (seven) days. Take with a full glass of water on an empty stomach. 12 tablet 3 Past Week at Unknown time  . aspirin 81 MG tablet Take 81 mg by mouth daily.   Past Week at Unknown time  . atorvastatin (LIPITOR) 10 MG tablet One by mouth at bedtime three nights a week 36 tablet 3 Past Week at Unknown time  . Biotin 5000 MCG CAPS Take 1 capsule by mouth daily.   Past Week at Unknown time  . Calcium Carbonate (CALCIUM 600 PO) Take 1 tablet by mouth every other day.    Past Week at Unknown time  . Cholecalciferol (VITAMIN D PO) Take 1,000 Int'l Units by mouth daily.   Past Week at Unknown time  . Cyanocobalamin (VITAMIN B 12 PO) Take 500 mg by mouth daily.   Past Week at Unknown time  . folic acid (FOLVITE) 631 MCG tablet Take 400 mcg by mouth daily.   Past Week at Unknown time  . Magnesium 250 MG TABS Take 1 tablet by mouth daily.   Past Week at Unknown time  . metoprolol succinate (TOPROL-XL) 50 MG 24 hr tablet Take 1 tablet (50 mg total) by mouth daily. 30 tablet 11 Past Week at Unknown time  . Multiple Vitamin (MULTIVITAMIN) tablet Take 1 tablet by mouth daily.   Past Week at Unknown time  . Multiple Vitamins-Minerals (ZINC PO) Take 1 capsule by mouth daily.   Past Week at Unknown time  . Omega-3 Fatty  Acids (FISH OIL) 1000 MG CAPS Take 1 capsule by mouth every other day.    Past Week at Unknown time  . Potassium Gluconate 595 MG CAPS Take by mouth daily.   Past Week at Unknown time  . vitamin C (ASCORBIC ACID) 500 MG tablet Take 500 mg by mouth every other day.    Past Week at Unknown time     Allergies  Allergen Reactions  . Sulfa Antibiotics Nausea Only and Other (See Comments)    Upset stomach     Past Medical History:  Diagnosis Date  . Allergy   . Breast cancer ([place]) 2009   left /chemo/rad  . Cancer ([place]) 2009   left breast, T1, N0, M0. ER/PR positive, HER2 neu positive  . Dental bridge present    permanant - upper  . GERD (gastroesophageal reflux disease)   . Hyperlipidemia   . Personal history of chemotherapy   . Personal history of radiation therapy   . Personal history of tobacco use, presenting hazards to health   . SVT (supraventricular tachycardia) ([place])   . Tachycardia   . Tubular adenoma of colon     Review of systems:  Otherwise negative.  Physical Exam  Gen: Alert, oriented. Appears stated age.  HEENT: [place]/AT. PERRLA. Lungs: CTA, no wheezes. CV: RR nl S1, S2. Abd: soft, benign, no masses. BS+ Ext: No edema. Pulses 2+    Planned procedures: Proceed with colonoscopy. The patient understands the nature of the planned procedure, indications, risks, alternatives and potential complications including but not limited to bleeding, infection, perforation, damage to internal organs and possible oversedation/side effects from anesthesia. The patient agrees and gives consent to proceed.  Please refer to procedure notes for findings, recommendations and patient disposition/instructions.     Enolia Koepke K. Alice Reichert, M.D. Gastroenterology 06/15/2019  9:02 AM

## 2019-06-15 NOTE — Anesthesia Post-op Follow-up Note (Signed)
Anesthesia QCDR form completed.        

## 2019-06-16 ENCOUNTER — Other Ambulatory Visit: Payer: Self-pay

## 2019-06-16 ENCOUNTER — Encounter: Payer: Self-pay | Admitting: Internal Medicine

## 2019-06-16 ENCOUNTER — Ambulatory Visit
Admission: RE | Admit: 2019-06-16 | Discharge: 2019-06-16 | Disposition: A | Discharge status: Home / Self Care | Payer: Medicare Other | Source: Ambulatory Visit | Attending: Hematology and Oncology | Admitting: Hematology and Oncology

## 2019-06-16 DIAGNOSIS — Z1231 Encounter for screening mammogram for malignant neoplasm of breast: Secondary | ICD-10-CM | POA: Insufficient documentation

## 2019-06-16 DIAGNOSIS — C50212 Malignant neoplasm of upper-inner quadrant of left female breast: Secondary | ICD-10-CM | POA: Diagnosis present

## 2019-06-16 LAB — SURGICAL PATHOLOGY

## 2019-06-24 ENCOUNTER — Ambulatory Visit: Payer: Medicare Other | Admitting: Surgery

## 2019-06-26 ENCOUNTER — Encounter: Payer: Self-pay | Admitting: Family Medicine

## 2019-06-29 ENCOUNTER — Telehealth: Payer: Self-pay

## 2019-06-29 NOTE — Telephone Encounter (Signed)
Spoke with patient's husband about her scheduled appointment with Dr.Pabon 07/01/19 at 3:45p needs to be rescheduled due to an emergency. Patient's husband said he will give our office a call back due to patient not being at home.

## 2019-07-01 ENCOUNTER — Ambulatory Visit: Payer: Medicare Other | Admitting: Surgery

## 2019-07-13 ENCOUNTER — Ambulatory Visit: Payer: Medicare Other | Admitting: Surgery

## 2019-07-22 ENCOUNTER — Ambulatory Visit: Payer: Medicare Other | Admitting: Surgery

## 2019-07-27 ENCOUNTER — Ambulatory Visit (INDEPENDENT_AMBULATORY_CARE_PROVIDER_SITE_OTHER): Payer: Medicare Other | Admitting: Surgery

## 2019-07-27 ENCOUNTER — Encounter: Payer: Self-pay | Admitting: Surgery

## 2019-07-27 ENCOUNTER — Other Ambulatory Visit: Payer: Self-pay

## 2019-07-27 VITALS — BP 157/78 | HR 84 | Temp 97.3°F | Resp 14 | Ht 65.0 in | Wt 164.0 lb

## 2019-07-27 DIAGNOSIS — C50212 Malignant neoplasm of upper-inner quadrant of left female breast: Secondary | ICD-10-CM

## 2019-07-27 NOTE — Patient Instructions (Signed)
Please continue to do self breast exams. We will schedule your mammogram for December 2021. You will be notify about your appointment in November 2021 and follow up with an office visit.    Breast Self-Awareness Breast self-awareness means being familiar with how your breasts look and feel. It involves checking your breasts regularly and reporting any changes to your health care provider. Practicing breast self-awareness is important. Sometimes changes may not be harmful (are benign), but sometimes a change in your breasts can be a sign of a serious medical problem. It is important to learn how to do this procedure correctly so that you can catch problems early, when treatment is more likely to be successful. All women should practice breast self-awareness, including women who have had breast implants. What you need:  A mirror.  A well-lit room. How to do a breast self-exam A breast self-exam is one way to learn what is normal for your breasts and whether your breasts are changing. To do a breast self-exam: Look for changes  1. Remove all the clothing above your waist. 2. Stand in front of a mirror in a room with good lighting. 3. Put your hands on your hips. 4. Push your hands firmly downward. 5. Compare your breasts in the mirror. Look for differences between them (asymmetry), such as: ? Differences in shape. ? Differences in size. ? Puckers, dips, and bumps in one breast and not the other. 6. Look at each breast for changes in the skin, such as: ? Redness. ? Scaly areas. 7. Look for changes in your nipples, such as: ? Discharge. ? Bleeding. ? Dimpling. ? Redness. ? A change in position. Feel for changes Carefully feel your breasts for lumps and changes. It is best to do this while lying on your back on the floor, and again while sitting or standing in the tub or shower with soapy water on your skin. Feel each breast in the following way: 1. Place the arm on the side of the breast  you are examining above your head. 2. Feel your breast with the other hand. 3. Start in the nipple area and make -inch (2 cm) overlapping circles to feel your breast. Use the pads of your three middle fingers to do this. Apply light pressure, then medium pressure, then firm pressure. The light pressure will allow you to feel the tissue closest to the skin. The medium pressure will allow you to feel the tissue that is a little deeper. The firm pressure will allow you to feel the tissue close to the ribs. 4. Continue the overlapping circles, moving downward over the breast until you feel your ribs below your breast. 5. Move one finger-width toward the center of the body. Continue to use the -inch (2 cm) overlapping circles to feel your breast as you move slowly up toward your collarbone. 6. Continue the up-and-down exam using all three pressures until you reach your armpit.  Write down what you find Writing down what you find can help you remember what to discuss with your health care provider. Write down:  What is normal for each breast.  Any changes that you find in each breast, including: ? The kind of changes you find. ? Any pain or tenderness. ? Size and location of any lumps.  Where you are in your menstrual cycle, if you are still menstruating. General tips and recommendations  Examine your breasts every month.  If you are breastfeeding, the best time to examine your breasts is after  a feeding or after using a breast pump.  If you menstruate, the best time to examine your breasts is 5-7 days after your period. Breasts are generally lumpier during menstrual periods, and it may be more difficult to notice changes.  With time and practice, you will become more familiar with the variations in your breasts and more comfortable with the exam. Contact a health care provider if you:  See a change in the shape or size of your breasts or nipples.  See a change in the skin of your breast  or nipples, such as a reddened or scaly area.  Have unusual discharge from your nipples.  Find a lump or thick area that was not there before.  Have pain in your breasts.  Have any concerns related to your breast health. Summary  Breast self-awareness includes looking for physical changes in your breasts, as well as feeling for any changes within your breasts.  Breast self-awareness should be performed in front of a mirror in a well-lit room.  You should examine your breasts every month. If you menstruate, the best time to examine your breasts is 5-7 days after your menstrual period.  Let your health care provider know of any changes you notice in your breasts, including changes in size, changes on the skin, pain or tenderness, or unusual fluid from your nipples. This information is not intended to replace advice given to you by your health care provider. Make sure you discuss any questions you have with your health care provider. Document Released: 07/23/2005 Document Revised: 03/11/2018 Document Reviewed: 03/11/2018 Elsevier Patient Education  2020 [employer].

## 2019-07-29 ENCOUNTER — Encounter: Payer: Self-pay | Admitting: Surgery

## 2019-07-29 NOTE — Progress Notes (Signed)
Outpatient Surgical Follow Up  07/29/2019  Toni Callahan is an 78 y.o. adult.   Chief Complaint  Patient presents with  . Follow-up    1 yr f/u breast exam and mammogram    HPI: Toni Callahan is an 78 year old female with history of left breast cancer status post lumpectomy and sentinel lymph node biopsy and  Chemo/radiation therapy by Dr. Bary Castilla in 2009.  She has been doing very well.  No fevers no chills.  No new breast concerns.  No masses no weight loss no lymphadenopathy.  She did have a routine mammogram that have personally reviewed showing no evidence of new concerning lesions.  Past Medical History:  Diagnosis Date  . Allergy   . Breast cancer ([place]) 2009   left /chemo/rad  . Cancer ([place]) 2009   left breast, T1, N0, M0. ER/PR positive, HER2 neu positive  . Dental bridge present    permanant - upper  . GERD (gastroesophageal reflux disease)   . Hyperlipidemia   . Personal history of chemotherapy   . Personal history of radiation therapy   . Personal history of tobacco use, presenting hazards to health   . SVT (supraventricular tachycardia) ([place])   . Tachycardia   . Tubular adenoma of colon     Past Surgical History:  Procedure Laterality Date  . BREAST BIOPSY Left 2009   positive  . BREAST CYST ASPIRATION Bilateral 2005   neg  . BREAST LUMPECTOMY Left 2009   positive  . BREAST SURGERY Left 2009   lumpectomy  . CATARACT EXTRACTION W/ INTRAOCULAR LENS IMPLANT Bilateral   . COLONOSCOPY  [id number]   Dr. Candace Cruise  . COLONOSCOPY N/A 09/07/2015   Procedure: COLONOSCOPY;  Surgeon: Hulen Luster, MD;  Location: [facility];  Service: Gastroenterology;  Laterality: N/A;  . COLONOSCOPY WITH PROPOFOL N/A 06/15/2019   Procedure: COLONOSCOPY WITH PROPOFOL;  Surgeon: [place], Benay Pike, MD;  Location: [facility] ENDOSCOPY;  Service: Endoscopy;  Laterality: N/A;  . POLYPECTOMY  09/07/2015   Procedure: POLYPECTOMY;  Surgeon: Hulen Luster, MD;  Location: [facility];  Service:  Gastroenterology;;  . PORT-A-CATH REMOVAL  2011  . PORTACATH PLACEMENT  2009  . TONSILLECTOMY    . TONSILLECTOMY      Family History  Problem Relation Age of Onset  . Stroke Mother   . Heart disease Father   . Diabetes Brother   . Heart disease Paternal Grandmother   . Cancer Neg Hx   . COPD Neg Hx   . Hypertension Neg Hx   . Breast cancer Neg Hx     Social History:  reports that she quit smoking about 35 years ago. Her smoking use included cigarettes. She quit after 20.00 years of use. She has never used smokeless tobacco. She reports current alcohol use. She reports that she does not use drugs.  Allergies:  Allergies  Allergen Reactions  . Sulfa Antibiotics Nausea Only and Other (See Comments)    Upset stomach    Medications reviewed.    ROS Full ROS performed and is otherwise negative other than what is stated in HPI   BP (!) 157/78   Pulse 84   Temp (!) 97.3 F (36.3 C) (Temporal)   Resp 14   Ht '5\' 5"'  (1.651 m)   Wt 164 lb (74.4 kg)   SpO2 96%   BMI 27.29 kg/m   Physical Exam Vitals and nursing note reviewed. Exam conducted with a chaperone present.  Constitutional:  General: She is not in acute distress.    Appearance: Normal appearance. She is normal weight.  Eyes:     General: No scleral icterus.       Right eye: No discharge.        Left eye: No discharge.  Cardiovascular:     Rate and Rhythm: Normal rate and regular rhythm.     Heart sounds: No murmur.  Pulmonary:     Effort: Pulmonary effort is normal. No respiratory distress.     Breath sounds: Normal breath sounds.     Comments: BREAST: Left lumpectomy scar and sentinel lymph node biopsy scar.  No evidence of new concerning lesions on either breast.  No evidence of lymphadenopathy. Abdominal:     General: Abdomen is flat. There is no distension.     Palpations: Abdomen is soft. There is no mass.     Tenderness: There is no abdominal tenderness. There is no guarding or rebound.      Hernia: No hernia is present.  Musculoskeletal:     Cervical back: Normal range of motion and neck supple. No rigidity or tenderness.  Skin:    General: Skin is warm and dry.     Capillary Refill: Capillary refill takes less than 2 seconds.  Neurological:     General: No focal deficit present.     Mental Status: She is alert and oriented to person, place, and time.  Psychiatric:        Mood and Affect: Mood normal.        Behavior: Behavior normal.        Thought Content: Thought content normal.        Judgment: Judgment normal.         Assessment/Plan: 78 year old female history of left breast cancer.  No evidence of recurrent disease.  I will recommend routine mammogram and physical exam next year as well as monthly self breast examination.  Greater than 50% of the 25 minutes  visit was spent in counseling/coordination of care   Caroleen Hamman, MD [facility]

## 2019-09-21 ENCOUNTER — Ambulatory Visit: Payer: Medicare Other | Admitting: Family Medicine

## 2019-09-29 ENCOUNTER — Encounter: Payer: Medicare Other | Admitting: Family Medicine

## 2019-10-13 ENCOUNTER — Other Ambulatory Visit: Payer: Self-pay | Admitting: Emergency Medicine

## 2019-10-13 DIAGNOSIS — E785 Hyperlipidemia, unspecified: Secondary | ICD-10-CM

## 2019-10-13 MED ORDER — ATORVASTATIN CALCIUM 10 MG PO TABS: ORAL_TABLET | ORAL | 0 refills | Status: DC

## 2019-10-13 NOTE — Addendum Note (Signed)
Addended by: Clemetine Marker D on: 10/13/2019 10:52 AM   Modules accepted: Level of Service

## 2019-11-03 ENCOUNTER — Ambulatory Visit (INDEPENDENT_AMBULATORY_CARE_PROVIDER_SITE_OTHER): Payer: Medicare PPO

## 2019-11-03 ENCOUNTER — Other Ambulatory Visit: Payer: Self-pay

## 2019-11-03 DIAGNOSIS — Z0000 Encounter for general adult medical examination without abnormal findings: Secondary | ICD-10-CM | POA: Diagnosis not present

## 2019-11-03 NOTE — Patient Instructions (Signed)
Ms. Willers , Thank you for taking time to come for your Medicare Wellness Visit. I appreciate your ongoing commitment to your health goals. Please review the following plan we discussed and let me know if I can assist you in the future.   Screening recommendations/referrals: Colonoscopy: done 06/15/19 Mammogram: done 06/16/19 Bone Density: done 06/11/18 Recommended yearly ophthalmology/optometry visit for glaucoma screening and checkup Recommended yearly dental visit for hygiene and checkup  Vaccinations: Influenza vaccine: done 04/24/19 Pneumococcal vaccine: done 03/02/14 Tdap vaccine: done 08/25/13 Shingles vaccine: done 06/07/17 & 09/06/17 Covid-19: done 08/18/19 & 09/15/19  Advanced directives: Please bring a copy of your health care power of attorney and living will to the office at your convenience.  Conditions/risks identified: Keep up the great work!  Next appointment: Please follow up in one year for your Medicare Annual Wellness visit.     Preventive Care 22 Years and Older, Female Preventive care refers to lifestyle choices and visits with your health care provider that can promote health and wellness. What does preventive care include?  A yearly physical exam. This is also called an annual well check.  Dental exams once or twice a year.  Routine eye exams. Ask your health care provider how often you should have your eyes checked.  Personal lifestyle choices, including:  Daily care of your teeth and gums.  Regular physical activity.  Eating a healthy diet.  Avoiding tobacco and drug use.  Limiting alcohol use.  Practicing safe sex.  Taking low-dose aspirin every day.  Taking vitamin and mineral supplements as recommended by your health care provider. What happens during an annual well check? The services and screenings done by your health care provider during your annual well check will depend on your age, overall health, lifestyle risk factors, and family  history of disease. Counseling  Your health care provider may ask you questions about your:  Alcohol use.  Tobacco use.  Drug use.  Emotional well-being.  Home and relationship well-being.  Sexual activity.  Eating habits.  History of falls.  Memory and ability to understand (cognition).  Work and work Statistician.  Reproductive health. Screening  You may have the following tests or measurements:  Height, weight, and BMI.  Blood pressure.  Lipid and cholesterol levels. These may be checked every 5 years, or more frequently if you are over 24 years old.  Skin check.  Lung cancer screening. You may have this screening every year starting at age 37 if you have a 30-pack-year history of smoking and currently smoke or have quit within the past 15 years.  Fecal occult blood test (FOBT) of the stool. You may have this test every year starting at age 45.  Flexible sigmoidoscopy or colonoscopy. You may have a sigmoidoscopy every 5 years or a colonoscopy every 10 years starting at age 39.  Hepatitis C blood test.  Hepatitis B blood test.  Sexually transmitted disease (STD) testing.  Diabetes screening. This is done by checking your blood sugar (glucose) after you have not eaten for a while (fasting). You may have this done every 1-3 years.  Bone density scan. This is done to screen for osteoporosis. You may have this done starting at age 54.  Mammogram. This may be done every 1-2 years. Talk to your health care provider about how often you should have regular mammograms. Talk with your health care provider about your test results, treatment options, and if necessary, the need for more tests. Vaccines  Your health care provider may  recommend certain vaccines, such as:  Influenza vaccine. This is recommended every year.  Tetanus, diphtheria, and acellular pertussis (Tdap, Td) vaccine. You may need a Td booster every 10 years.  Zoster vaccine. You may need this after  age 65.  Pneumococcal 13-valent conjugate (PCV13) vaccine. One dose is recommended after age 11.  Pneumococcal polysaccharide (PPSV23) vaccine. One dose is recommended after age 109. Talk to your health care provider about which screenings and vaccines you need and how often you need them. This information is not intended to replace advice given to you by your health care provider. Make sure you discuss any questions you have with your health care provider. Document Released: 08/19/2015 Document Revised: 04/11/2016 Document Reviewed: 05/24/2015 Elsevier Interactive Patient Education  2017 [place] Prevention in the Home Falls can cause injuries. They can happen to people of all ages. There are many things you can do to make your home safe and to help prevent falls. What can I do on the outside of my home?  Regularly fix the edges of walkways and driveways and fix any cracks.  Remove anything that might make you trip as you walk through a door, such as a raised step or threshold.  Trim any bushes or trees on the path to your home.  Use bright outdoor lighting.  Clear any walking paths of anything that might make someone trip, such as rocks or tools.  Regularly check to see if handrails are loose or broken. Make sure that both sides of any steps have handrails.  Any raised decks and porches should have guardrails on the edges.  Have any leaves, snow, or ice cleared regularly.  Use sand or salt on walking paths during winter.  Clean up any spills in your garage right away. This includes oil or grease spills. What can I do in the bathroom?  Use night lights.  Install grab bars by the toilet and in the tub and shower. Do not use towel bars as grab bars.  Use non-skid mats or decals in the tub or shower.  If you need to sit down in the shower, use a plastic, non-slip stool.  Keep the floor dry. Clean up any water that spills on the floor as soon as it  happens.  Remove soap buildup in the tub or shower regularly.  Attach bath mats securely with double-sided non-slip rug tape.  Do not have throw rugs and other things on the floor that can make you trip. What can I do in the bedroom?  Use night lights.  Make sure that you have a light by your bed that is easy to reach.  Do not use any sheets or blankets that are too big for your bed. They should not hang down onto the floor.  Have a firm chair that has side arms. You can use this for support while you get dressed.  Do not have throw rugs and other things on the floor that can make you trip. What can I do in the kitchen?  Clean up any spills right away.  Avoid walking on wet floors.  Keep items that you use a lot in easy-to-reach places.  If you need to reach something above you, use a strong step stool that has a grab bar.  Keep electrical cords out of the way.  Do not use floor polish or wax that makes floors slippery. If you must use wax, use non-skid floor wax.  Do not have throw rugs and  other things on the floor that can make you trip. What can I do with my stairs?  Do not leave any items on the stairs.  Make sure that there are handrails on both sides of the stairs and use them. Fix handrails that are broken or loose. Make sure that handrails are as long as the stairways.  Check any carpeting to make sure that it is firmly attached to the stairs. Fix any carpet that is loose or worn.  Avoid having throw rugs at the top or bottom of the stairs. If you do have throw rugs, attach them to the floor with carpet tape.  Make sure that you have a light switch at the top of the stairs and the bottom of the stairs. If you do not have them, ask someone to add them for you. What else can I do to help prevent falls?  Wear shoes that:  Do not have high heels.  Have rubber bottoms.  Are comfortable and fit you well.  Are closed at the toe. Do not wear sandals.  If you  use a stepladder:  Make sure that it is fully opened. Do not climb a closed stepladder.  Make sure that both sides of the stepladder are locked into place.  Ask someone to hold it for you, if possible.  Clearly mark and make sure that you can see:  Any grab bars or handrails.  First and last steps.  Where the edge of each step is.  Use tools that help you move around (mobility aids) if they are needed. These include:  Canes.  Walkers.  Scooters.  Crutches.  Turn on the lights when you go into a dark area. Replace any light bulbs as soon as they burn out.  Set up your furniture so you have a clear path. Avoid moving your furniture around.  If any of your floors are uneven, fix them.  If there are any pets around you, be aware of where they are.  Review your medicines with your doctor. Some medicines can make you feel dizzy. This can increase your chance of falling. Ask your doctor what other things that you can do to help prevent falls. This information is not intended to replace advice given to you by your health care provider. Make sure you discuss any questions you have with your health care provider. Document Released: 05/19/2009 Document Revised: 12/29/2015 Document Reviewed: 08/27/2014 Elsevier Interactive Patient Education  2017 [employer].

## 2019-11-03 NOTE — Progress Notes (Signed)
Subjective:   Toni Callahan is a 79 y.o. female who presents for Medicare Annual (Subsequent) preventive examination.  Review of Systems:   Cardiac Risk Factors include: advanced age (>50mn, >>12women);dyslipidemia     Objective:     Vitals: BP 118/62 (BP Location: Right Arm, Patient Position: Sitting, Cuff Size: Normal)   Pulse 78   Temp (!) 96.8 F (36 C) (Temporal)   Resp 16   Ht '5\' 5"'  (1.651 m)   Wt 165 lb (74.8 kg)   SpO2 98%   BMI 27.46 kg/m   Body mass index is 27.46 kg/m.  Advanced Directives 11/03/2019 06/15/2019 12/17/2018 09/18/2018 06/18/2018 11/06/2017 05/08/2017  Does Patient Have a Medical Advance Directive? Yes Yes No Yes Yes Yes Yes  Type of [place] will Living will - [place] will [place] will - -  Does patient want to make changes to medical advance directive? - - - - - - -  Copy of [place] Chart? No - copy requested - - No - copy requested No - copy requested - -  Would patient like information on creating a medical advance directive? - - No - Patient declined - - - -    Tobacco Social History   Tobacco Use  Smoking Status Former Smoker  . Years: 20.00  . Types: Cigarettes  . Quit date: 08/06/1984  . Years since quitting: 35.2  Smokeless Tobacco Never Used  Tobacco Comment   quit in 1986 approximately     Counseling given: Not Answered Comment: quit in 1986 approximately   Clinical Intake:  Pre-visit preparation completed: Yes  Pain : No/denies pain     BMI - recorded: 27.46 Nutritional Status: BMI 25 -29 Overweight Nutritional Risks: None Diabetes: No  How often do you need to have someone help you when you read instructions, pamphlets, or other written materials from your doctor or pharmacy?: 1 - Never  Interpreter Needed?: No  Information entered by :: KClemetine MarkerLPN  Past Medical History:  Diagnosis  Date  . Allergy   . Breast cancer ([facility]   left /chemo/rad  . Cancer ([facility] 2009   left breast, T1, N0, M0. ER/PR positive, HER2 neu positive  . Dental bridge present    permanant - upper  . GERD (gastroesophageal reflux disease)   . Hyperlipidemia   . Personal history of chemotherapy   . Personal history of radiation therapy   . Personal history of tobacco use, presenting hazards to health   . SVT (supraventricular tachycardia) ([facility]   . Tachycardia   . Tubular adenoma of colon    Past Surgical History:  Procedure Laterality Date  . BREAST BIOPSY Left 2009   positive  . BREAST CYST ASPIRATION Bilateral 2005   neg  . BREAST LUMPECTOMY Left 2009   positive  . BREAST SURGERY Left 2009   lumpectomy  . CATARACT EXTRACTION W/ INTRAOCULAR LENS IMPLANT Bilateral   . COLONOSCOPY  [id number]  Dr. OCandace Cruise . COLONOSCOPY N/A 09/07/2015   Procedure: COLONOSCOPY;  Surgeon: PHulen Luster MD;  [facility] [facility]  Service: Gastroenterology;  Laterality: N/A;  . COLONOSCOPY WITH PROPOFOL N/A 06/15/2019   Procedure: COLONOSCOPY WITH PROPOFOL;  Surgeon: Toledo, TBenay Pike MD;  Location: [facility] ENDOSCOPY;  Service: Endoscopy;  Laterality: N/A;  . POLYPECTOMY  09/07/2015   Procedure: POLYPECTOMY;  Surgeon: PHulen Luster MD;  [facility]  Service: Gastroenterology;;  . PORT-A-CATH REMOVAL  2011  . PORTACATH PLACEMENT  2009  . TONSILLECTOMY    . TONSILLECTOMY     Family History  Problem Relation Age of Onset  . Stroke Mother   . Heart disease Father   . Diabetes Brother   . Heart disease Paternal Grandmother   . Cancer Neg Hx   . COPD Neg Hx   . Hypertension Neg Hx   . Breast cancer Neg Hx    Social History   Socioeconomic History  . Marital status: Married    Spouse name: Not on file  . Number of children: 0  . Years of education: Not on file  . Highest education level: Bachelor's degree (e.g., BA, AB, BS)  Occupational History  . Not on file  Tobacco Use  .  Smoking status: Former Smoker    Years: 20.00    Types: Cigarettes    Quit date: 08/06/1984    Years since quitting: 35.2  . Smokeless tobacco: Never Used  . Tobacco comment: quit in 1986 approximately  Substance and Sexual Activity  . Alcohol use: Yes    Comment: drinks rarely  . Drug use: No  . Sexual activity: Yes  Other Topics Concern  . Not on file  Social History Narrative  . Not on file   Social Determinants of Health   Financial Resource Strain: Low Risk   . Difficulty of Paying Living Expenses: Not hard at all  Food Insecurity: No Food Insecurity  . Worried About Charity fundraiser in the Last Year: Never true  . Ran Out of Food in the Last Year: Never true  Transportation Needs: No Transportation Needs  . Lack of Transportation (Medical): No  . Lack of Transportation (Non-Medical): No  Physical Activity: Insufficiently Active  . Days of Exercise per Week: 2 days  . Minutes of Exercise per Session: 60 min  Stress: No Stress Concern Present  . Feeling of Stress : Not at all  Social Connections: Slightly Isolated  . Frequency of Communication with Friends and Family: More than three times a week  . Frequency of Social Gatherings with Friends and Family: Three times a week  . Attends Religious Services: More than 4 times per year  . Active Member of Clubs or Organizations: No  . Attends Archivist Meetings: Never  . Marital Status: Married    Outpatient Encounter Medications as of 11/03/2019  Medication Sig  . alendronate (FOSAMAX) 70 MG tablet Take 1 tablet (70 mg total) by mouth every 7 (seven) days. Take with a full glass of water on an empty stomach.  Marland Kitchen aspirin 81 MG tablet Take 81 mg by mouth daily.  Marland Kitchen atorvastatin (LIPITOR) 10 MG tablet One by mouth at bedtime three nights a week  . Biotin 5000 MCG CAPS Take 1 capsule by mouth daily.  . Calcium Carbonate (CALCIUM 600 PO) Take 1 tablet by mouth every other day.   . Cholecalciferol (VITAMIN D PO) Take  1,000 Int'l Units by mouth daily.  . Cyanocobalamin (VITAMIN B 12 PO) Take 500 mg by mouth daily.  . folic acid (FOLVITE) 161 MCG tablet Take 400 mcg by mouth daily.  . Magnesium 250 MG TABS Take 1 tablet by mouth daily.  . metoprolol succinate (TOPROL-XL) 50 MG 24 hr tablet Take 1 tablet (50 mg total) by mouth daily.  . Multiple Vitamin (MULTIVITAMIN) tablet Take 1 tablet by mouth daily.  . Omega-3 Fatty Acids (FISH OIL) 1000 MG  CAPS Take 1 capsule by mouth every other day.   . Potassium Gluconate 595 MG CAPS Take by mouth daily.  . vitamin C (ASCORBIC ACID) 500 MG tablet Take 500 mg by mouth every other day.   . zinc gluconate 50 MG tablet Take 50 mg by mouth daily.  . [DISCONTINUED] Multiple Vitamins-Minerals (ZINC PO) Take 1 capsule by mouth daily.   No facility-administered encounter medications on file as of 11/03/2019.    Activities of Daily Living In your present state of health, do you have any difficulty performing the following activities: 11/03/2019 02/23/2019  Hearing? N N  Comment declines hearing aids -  Vision? N N  Difficulty concentrating or making decisions? N N  Walking or climbing stairs? N N  Dressing or bathing? N N  Doing errands, shopping? N N  Preparing Food and eating ? N -  Using the Toilet? N -  In the past six months, have you accidently leaked urine? N -  Do you have problems with loss of bowel control? N -  Managing your Medications? N -  Managing your Finances? N -  Housekeeping or managing your Housekeeping? N -  Some recent data might be hidden    Patient Care Team: Hubbard Hartshorn, FNP as PCP - General (Family Medicine) Corey Skains, MD as Consulting Physician (Cardiology) Bary Castilla, Forest Gleason, MD (General Surgery) Lequita Asal, MD as Referring Physician (Hematology and Oncology) Efrain Sella, MD as Consulting Physician (Gastroenterology)    Assessment:   This is a routine wellness examination for [place].  Exercise Activities  and Dietary recommendations Current Exercise Habits: Home exercise routine, Type of exercise: walking;Other - see comments(yard work, house work), Time (Minutes): 60, Frequency (Times/Week): 2, Weekly Exercise (Minutes/Week): 120, Intensity: Moderate, Exercise limited by: None identified  Goals    . DIET - EAT MORE FRUITS AND VEGETABLES (pt-stated)     Five a day       Fall Risk Fall Risk  11/03/2019 07/27/2019 02/23/2019 09/18/2018 06/12/2018  Falls in the past year? 0 0 0 0 0  Number falls in past yr: 0 - 0 0 -  Injury with Fall? 0 - 0 0 -  Risk for fall due to : No Fall Risks - - - -  Follow up Falls prevention discussed - - Falls prevention discussed -   FALL RISK PREVENTION PERTAINING TO THE HOME:  Any stairs in or around the home? Yes  If so, do they handrails? Yes   Home free of loose throw rugs in walkways, pet beds, electrical cords, etc? Yes  Adequate lighting in your home to reduce risk of falls? Yes   ASSISTIVE DEVICES UTILIZED TO PREVENT FALLS:  Life alert? No  Use of a cane, walker or w/c? No  Grab bars in the bathroom? Yes  Shower chair or bench in shower? No  Elevated toilet seat or a handicapped toilet? Yes   DME ORDERS:  DME order needed?  No   TIMED UP AND GO:  Was the test performed? Yes .  Length of time to ambulate 10 feet: 5 sec.   GAIT:  Appearance of gait: Gait stead-fast and without the use of an assistive device.    Education: Fall risk prevention has been discussed.  Intervention(s) required? No    Depression Screen PHQ 2/9 Scores 11/03/2019 02/23/2019 09/18/2018 09/11/2017  PHQ - 2 Score 0 0 0 0  PHQ- 9 Score - 0 0 -     Cognitive Function  6CIT Screen 11/03/2019 09/18/2018 09/11/2017  What Year? 0 points 0 points 0 points  What month? 0 points 0 points 0 points  What time? 0 points 0 points 0 points  Count back from 20 0 points 0 points 0 points  Months in reverse 0 points 0 points 4 points  Repeat phrase 0 points 0 points 0 points   Total Score 0 0 4    Immunization History  Administered Date(s) Administered  . Fluad Quad(high Dose 65+) 04/24/2019  . Influenza,inj,quad, With Preservative 05/02/2018  . Influenza-Unspecified 04/09/2015, 04/11/2016, 05/02/2018  . Moderna SARS-COVID-2 Vaccination 08/18/2019, 09/15/2019  . Pneumococcal Conjugate-13 03/02/2014  . Pneumococcal Polysaccharide-23 05/01/2005  . Td 08/07/2003  . Tdap 08/25/2013  . Zoster 05/16/2006  . Zoster Recombinat (Shingrix) 06/07/2017, 09/06/2017    Qualifies for Shingles Vaccine? Up to date  Tdap: Up to date  Flu Vaccine: Up to date  Pneumococcal Vaccine: Up to date   Screening Tests Health Maintenance  Topic Date Due  . DEXA SCAN  06/11/2020  . MAMMOGRAM  06/15/2020  . COLONOSCOPY  06/14/2022  . TETANUS/TDAP  08/26/2023  . INFLUENZA VACCINE  Completed  . PNA vac Low Risk Adult  Completed    Cancer Screenings:  Colorectal Screening: Completed 06/15/19. Repeat every 3 years;   Mammogram: Completed 06/16/19. Repeat every year;   Bone Density: Completed 06/11/18. Results reflect OSTEOPOROSIS. Repeat every 2 years.   Lung Cancer Screening: (Low Dose CT Chest recommended if Age 73-80 years, 30 pack-year currently smoking OR have quit w/in 15years.) does not qualify.   Additional Screening:  Hepatitis C Screening: no longer required  Vision Screening: Recommended annual ophthalmology exams for early detection of glaucoma and other disorders of the eye. Is the patient up to date with their annual eye exam?  Yes  Who is the provider or what is the name of the office in which the pt attends annual eye exams? Dr. Atilano Median  Dental Screening: Recommended annual dental exams for proper oral hygiene  Community Resource Referral:  CRR required this visit?  No      Plan:     I have personally reviewed and addressed the Medicare Annual Wellness questionnaire and have noted the following in the patient's chart:  A. Medical and social  history B. Use of alcohol, tobacco or illicit drugs  C. Current medications and supplements D. Functional ability and status E.  Nutritional status F.  Physical activity G. Advance directives H. List of other physicians I.  Hospitalizations, surgeries, and ER visits in previous 12 months J.  [place] such as hearing and vision if needed, cognitive and depression L. Referrals and appointments   In addition, I have reviewed and discussed with patient certain preventive protocols, quality metrics, and best practice recommendations. A written personalized care plan for preventive services as well as general preventive health recommendations were provided to patient.   Signed,  Clemetine Marker, LPN Nurse Health Advisor   Nurse Notes: pt doing well and appreciative of visit today

## 2019-12-15 ENCOUNTER — Other Ambulatory Visit: Payer: Self-pay

## 2019-12-15 NOTE — Progress Notes (Signed)
Confirmed patient name and DOB for phone assessment. Patient has no new concerns at this time.

## 2019-12-16 ENCOUNTER — Inpatient Hospital Stay: Payer: Medicare PPO

## 2019-12-16 ENCOUNTER — Inpatient Hospital Stay: Payer: Medicare PPO | Attending: Hematology and Oncology | Admitting: Hematology and Oncology

## 2019-12-16 ENCOUNTER — Encounter: Payer: Self-pay | Admitting: Hematology and Oncology

## 2019-12-16 VITALS — BP 123/55 | HR 72 | Temp 96.1°F | Resp 16 | Wt 164.5 lb

## 2019-12-16 DIAGNOSIS — Z9221 Personal history of antineoplastic chemotherapy: Secondary | ICD-10-CM | POA: Insufficient documentation

## 2019-12-16 DIAGNOSIS — Z9223 Personal history of estrogen therapy: Secondary | ICD-10-CM | POA: Insufficient documentation

## 2019-12-16 DIAGNOSIS — Z170 Estrogen receptor positive status [ER+]: Secondary | ICD-10-CM | POA: Diagnosis not present

## 2019-12-16 DIAGNOSIS — Z923 Personal history of irradiation: Secondary | ICD-10-CM | POA: Insufficient documentation

## 2019-12-16 DIAGNOSIS — C50212 Malignant neoplasm of upper-inner quadrant of left female breast: Secondary | ICD-10-CM

## 2019-12-16 DIAGNOSIS — Z853 Personal history of malignant neoplasm of breast: Secondary | ICD-10-CM | POA: Diagnosis present

## 2019-12-16 DIAGNOSIS — M816 Localized osteoporosis [Lequesne]: Secondary | ICD-10-CM

## 2019-12-16 LAB — CBC WITH DIFFERENTIAL/PLATELET
Abs Immature Granulocytes: 0.01 10*3/uL (ref 0.00–0.07)
Basophils Absolute: 0 10*3/uL (ref 0.0–0.1)
Basophils Relative: 0 %
Eosinophils Absolute: 0 10*3/uL (ref 0.0–0.5)
Eosinophils Relative: 0 %
HCT: 42.3 % (ref 36.0–46.0)
Hemoglobin: 13.9 g/dL (ref 12.0–15.0)
Immature Granulocytes: 0 %
Lymphocytes Relative: 29 %
Lymphs Abs: 2.1 10*3/uL (ref 0.7–4.0)
MCH: 30.5 pg (ref 26.0–34.0)
MCHC: 32.9 g/dL (ref 30.0–36.0)
MCV: 93 fL (ref 80.0–100.0)
Monocytes Absolute: 0.4 10*3/uL (ref 0.1–1.0)
Monocytes Relative: 6 %
Neutro Abs: 4.6 10*3/uL (ref 1.7–7.7)
Neutrophils Relative %: 65 %
Platelets: 249 10*3/uL (ref 150–400)
RBC: 4.55 MIL/uL (ref 3.87–5.11)
RDW: 11.7 % (ref 11.5–15.5)
WBC: 7.1 10*3/uL (ref 4.0–10.5)
nRBC: 0 % (ref 0.0–0.2)

## 2019-12-16 LAB — COMPREHENSIVE METABOLIC PANEL
ALT: 18 U/L (ref 0–44)
AST: 20 U/L (ref 15–41)
Albumin: 3.7 g/dL (ref 3.5–5.0)
Alkaline Phosphatase: 45 U/L (ref 38–126)
Anion gap: 9 (ref 5–15)
BUN: 17 mg/dL (ref 8–23)
CO2: 27 mmol/L (ref 22–32)
Calcium: 9.1 mg/dL (ref 8.9–10.3)
Chloride: 104 mmol/L (ref 98–111)
Creatinine, Ser: 0.68 mg/dL (ref 0.44–1.00)
GFR calc Af Amer: >60 mL/min (ref >60)
GFR calc non Af Amer: >60 mL/min (ref >60)
Glucose, Bld: 96 mg/dL (ref 70–99)
Potassium: 3.9 mmol/L (ref 3.5–5.1)
Sodium: 140 mmol/L (ref 135–145)
Total Bilirubin: 0.7 mg/dL (ref 0.3–1.2)
Total Protein: 7.2 g/dL (ref 6.5–8.1)

## 2019-12-16 NOTE — Progress Notes (Signed)
[facility]  [address] [place], [place] [address] Phone: [phone]  Fax: [phone]   Clinic Day:  12/16/2019  Referring physician: Arnetha Courser, MD  Chief Complaint: Toni Callahan is a 79 y.o. adult with stage I Her2/neu+ left breast cancer who is seen for a 1 year assessment.  HPI: The patient was last seen in the medical oncology clinic on 12/17/2018. At that time, she was doing well.  Exam was stable. CA27.29 was normal. She remained on calcium, vitamin D and Femara.  She discontinued Femara at the end of 12/2018 after 10 years of adjuvant therapy.  Colonoscopy with Dr. Alice Reichert on 06/15/2019 revealed one 4 mm polyp in the ascending colon. There were non-bleeding internal hemorrhoids. Pathology revealed a tubular adenoma, negative for high-grade dysplasia and malignancy.   Bilateral screening mammogram on 06/16/2019 revealed no mammographic evidence of malignancy.  Echo on 07/22/2019 at [facility] showed a EF of 55%.  She had a 1 year follow up with Dr. Dahlia Byes on 07/27/2019. She was doing well. She had no breast concerns. Follow up was planned in 1 year.   During the interim, the patient felt "fine". She reports getting her Moderna COVID-19 vaccine on 08/18/2019 and 09/15/2019. She has no complaints. She denies any breast concerns.    Past Medical History:  Diagnosis Date  . Allergy   . Breast cancer ([place]) 2009   left /chemo/rad  . Cancer ([place]) 2009   left breast, T1, N0, M0. ER/PR positive, HER2 neu positive  . Dental bridge present    permanant - upper  . GERD (gastroesophageal reflux disease)   . Hyperlipidemia   . Personal history of chemotherapy   . Personal history of radiation therapy   . Personal history of tobacco use, presenting hazards to health   . SVT (supraventricular tachycardia) ([place])   . Tachycardia   . Tubular adenoma of colon     Past Surgical History:  Procedure Laterality Date  . BREAST BIOPSY Left 2009    positive  . BREAST CYST ASPIRATION Bilateral 2005   neg  . BREAST LUMPECTOMY Left 2009   positive  . BREAST SURGERY Left 2009   lumpectomy  . CATARACT EXTRACTION W/ INTRAOCULAR LENS IMPLANT Bilateral   . COLONOSCOPY  [id number]   Dr. Candace Cruise  . COLONOSCOPY N/A 09/07/2015   Procedure: COLONOSCOPY;  Surgeon: Hulen Luster, MD;  Location: [facility];  Service: Gastroenterology;  Laterality: N/A;  . COLONOSCOPY WITH PROPOFOL N/A 06/15/2019   Procedure: COLONOSCOPY WITH PROPOFOL;  Surgeon: Toledo, Benay Pike, MD;  Location: [facility] ENDOSCOPY;  Service: Endoscopy;  Laterality: N/A;  . POLYPECTOMY  09/07/2015   Procedure: POLYPECTOMY;  Surgeon: Hulen Luster, MD;  Location: [facility];  Service: Gastroenterology;;  . PORT-A-CATH REMOVAL  2011  . PORTACATH PLACEMENT  2009  . TONSILLECTOMY    . TONSILLECTOMY      Family History  Problem Relation Age of Onset  . Stroke Mother   . Heart disease Father   . Diabetes Brother   . Heart disease Paternal Grandmother   . Cancer Neg Hx   . COPD Neg Hx   . Hypertension Neg Hx   . Breast cancer Neg Hx     Social History:  reports that she quit smoking about 35 years ago. Her smoking use included cigarettes. She quit after 20.00 years of use. She has never used smokeless tobacco. She reports current alcohol use. She reports that she does not  use drugs. She has lived in the area 76 years.  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 She lives in [place].  The patient is alone today.  Allergies:  Allergies  Allergen Reactions  . Sulfa Antibiotics Nausea Only and Other (See Comments)    Upset stomach    Current Medications: Current Outpatient Medications  Medication Sig Dispense Refill  . Apoaequorin (PREVAGEN PO) Take by mouth.    Marland Kitchen aspirin 81 MG tablet Take 81 mg by mouth daily.    Marland Kitchen atorvastatin (LIPITOR) 10 MG tablet One by mouth at bedtime three nights a week 36 tablet 0   . Biotin 5000 MCG CAPS Take 1 capsule by mouth daily.    . Calcium Carbonate (CALCIUM 600 PO) Take 1 tablet by mouth every other day.     . Cholecalciferol (VITAMIN D PO) Take 1,000 Int'l Units by mouth daily.    . Cyanocobalamin (VITAMIN B 12 PO) Take 500 mg by mouth daily.    . folic acid (FOLVITE) 882 MCG tablet Take 400 mcg by mouth daily.    . Magnesium 250 MG TABS Take 1 tablet by mouth daily.    . metoprolol succinate (TOPROL-XL) 50 MG 24 hr tablet Take 1 tablet (50 mg total) by mouth daily. 30 tablet 11  . Multiple Vitamin (MULTIVITAMIN) tablet Take 1 tablet by mouth daily.    . Omega-3 Fatty Acids (FISH OIL) 1000 MG CAPS Take 1 capsule by mouth every other day.     . Potassium Gluconate 595 MG CAPS Take by mouth daily.    . vitamin C (ASCORBIC ACID) 500 MG tablet Take 500 mg by mouth every other day.     . zinc gluconate 50 MG tablet Take 50 mg by mouth daily.    Marland Kitchen alendronate (FOSAMAX) 70 MG tablet Take 1 tablet (70 mg total) by mouth every 7 (seven) days. Take with a full glass of water on an empty stomach. (Patient not taking: Reported on 12/15/2019) 12 tablet 3   No current facility-administered medications for this visit.    Review of Systems  Constitutional: Positive for weight loss (1 lb since 12/17/2018). Negative for chills, diaphoresis, fever and malaise/fatigue.       Feels "fine". No complaints.  HENT: Negative.  Negative for congestion, ear pain, hearing loss, nosebleeds, sinus pain and sore throat.   Eyes: Negative.  Negative for blurred vision, double vision and photophobia.  Respiratory: Negative.  Negative for cough, hemoptysis, sputum production and shortness of breath.   Cardiovascular: Negative.  Negative for chest pain, palpitations, orthopnea, leg swelling and PND.  Gastrointestinal: Negative.  Negative for abdominal pain, blood in stool, constipation, diarrhea, melena, nausea and vomiting.       Last colonoscopy was 06/15/2019.  Genitourinary: Negative.   Negative for dysuria, frequency, hematuria and urgency.  Musculoskeletal: Negative.  Negative for back pain, falls, joint pain and myalgias.  Skin: Negative.  Negative for itching and rash.  Neurological: Negative for dizziness, tingling, tremors, speech change, weakness and headaches.  Endo/Heme/Allergies: Does not bruise/bleed easily.  Psychiatric/Behavioral: Negative.  Negative for depression and memory loss. The patient is not nervous/anxious and does not have insomnia.   All other systems reviewed and are negative.  Performance status (ECOG):  0  Vitals Blood pressure (!) 123/55, pulse 72, temperature (!) 96.1 F (35.6 C), temperature source Tympanic, resp. rate 16, weight 164 lb 7.4 oz (74.6 kg), SpO2  98 %.  Physical Exam  Constitutional: She is oriented to person, place, and time. She appears well-developed and well-nourished. No distress.  HENT:  Head: Normocephalic and atraumatic.  Mouth/Throat: Oropharynx is clear and moist. No oropharyngeal exudate.  Curly white hair. Mask.  Eyes: Pupils are equal, round, and reactive to light. Conjunctivae and EOM are normal. No scleral icterus.  Glasses.  Neck: No JVD present.  Cardiovascular: Normal rate, regular rhythm and normal heart sounds. Exam reveals no gallop and no friction rub.  No murmur heard. Pulmonary/Chest: Effort normal and breath sounds normal. No respiratory distress. She has no wheezes. She has no rales. Right breast exhibits no inverted nipple, no mass, no nipple discharge, no skin change and no tenderness. Left breast exhibits skin change (mild fibrocystic changes). Left breast exhibits no inverted nipple, no mass, no nipple discharge and no tenderness.  Abdominal: Soft. Bowel sounds are normal. She exhibits no distension and no mass. There is no abdominal tenderness. There is no rebound and no guarding.  Musculoskeletal:        General: No edema. Normal range of motion.     Cervical back: Normal range of motion and  neck supple.  Lymphadenopathy:    She has no cervical adenopathy.    She has no axillary adenopathy.       Right: No supraclavicular adenopathy present.       Left: No supraclavicular adenopathy present.  Neurological: She is alert and oriented to person, place, and time.  Skin: Skin is warm and dry. No rash noted. She is not diaphoretic. No erythema. No pallor.  Psychiatric: She has a normal mood and affect. Her behavior is normal. Judgment and thought content normal.  Nursing note and vitals reviewed.   Appointment on 12/16/2019  Component Date Value Ref Range Status  . WBC 12/16/2019 7.1  4.0 - 10.5 K/uL Final  . RBC 12/16/2019 4.55  3.87 - 5.11 MIL/uL Final  . Hemoglobin 12/16/2019 13.9  12.0 - 15.0 g/dL Final  . HCT 12/16/2019 42.3  36.0 - 46.0 % Final  . MCV 12/16/2019 93.0  80.0 - 100.0 fL Final  . MCH 12/16/2019 30.5  26.0 - 34.0 pg Final  . MCHC 12/16/2019 32.9  30.0 - 36.0 g/dL Final  . RDW 12/16/2019 11.7  11.5 - 15.5 % Final  . Platelets 12/16/2019 249  150 - 400 K/uL Final  . nRBC 12/16/2019 0.0  0.0 - 0.2 % Final  . Neutrophils Relative % 12/16/2019 65  % Final  . Neutro Abs 12/16/2019 4.6  1.7 - 7.7 K/uL Final  . Lymphocytes Relative 12/16/2019 29  % Final  . Lymphs Abs 12/16/2019 2.1  0.7 - 4.0 K/uL Final  . Monocytes Relative 12/16/2019 6  % Final  . Monocytes Absolute 12/16/2019 0.4  0.1 - 1.0 K/uL Final  . Eosinophils Relative 12/16/2019 0  % Final  . Eosinophils Absolute 12/16/2019 0.0  0.0 - 0.5 K/uL Final  . Basophils Relative 12/16/2019 0  % Final  . Basophils Absolute 12/16/2019 0.0  0.0 - 0.1 K/uL Final  . Immature Granulocytes 12/16/2019 0  % Final  . Abs Immature Granulocytes 12/16/2019 0.01  0.00 - 0.07 K/uL Final   Performed at [facility], [address] [place], [place] [address]    Assessment:  Toni Callahan is a 79 y.o. adult with stage I Her2/neu+ left breast cancer s/p lumpectomy and sentinel lymph node biopsy on   02/19/2008.  No pathology is available. Notes  indicate pathologic stage [id number].  Tumor was ER positive, PR positive and HER-2/neu 3+.  BCI testing on 07/06/2015 revealed a high risk of late recurrence 8.1% (CI 3.3%-12.6%) at years 5-10 with a high likelihood of benefit of extended adjuvant hormonal therapy.  She received 6 cycles of Taxotere, carboplatin, and Herceptin ([place])  6 cycles (03/16/2008 - 06/29/2008). She received Herceptin every 3 weeks (last cycle 02/15/2009).  She received radiation. She began letrozole (Femara).  She was off therapy for only about 2-3 months.  Femara was discontinued at the end of 12/2018.  CA27.29 has been followed: 29.4 on 07/11/2010, 21.2 on 07/24/2011, 23.1 on 02/06/2012, 21.3 on 03/20/2013, 26.0 on 04/30/2014, 40.6 on 04/28/2015, 32.6 on 06/21/2015, 34.7 on 10/29/2016, 28.5 on 05/08/2017, 37.8 on 11/06/2017, 35.9 on 06/18/2018, and 33.5 on 12/17/2018.  Bilateral screening mammogram on 06/16/2019 revealed no mammographic evidence of malignancy.  Bone density on 05/19/2014 was normal with a T score of 0.3 in the left femur.  Bone density on 05/30/2016 was normal with a T-score of -0.1 in the AP spine (L1-L2).  Bone density on 06/11/2018 revealed osteoporosis with a T-score of -2.6 in the forearm radius.    She received the Moderna COVID-19 vaccine on 08/18/2019 and 09/15/2019.  Symptomatically, she feels "fine".  She denies any breast concerns.  Exam is stable.  Plan: 1.   Labs today: CBC with diff, CMP, CA 27.29. 2.   Stage I Her2/neu+ left breast cancer Clinically she continues to do well. Mammogram on 06/16/2019 revealed no evidence of disease. She is nearing the end of 10 years of adjuvant endocrine therapy. Records previously regarding start date of endocrine therapy. Patient currently on Femara. Follow-up yearly in clinic. 3.   Osteoporosis Patient on calcium and vitamin D. 4.   Mammogram on 06/15/2020. 5.   RTC in 1 year for MD assessment, labs  (CBC with diff, CMP, CA27.29), and review of interval mammogram.  I discussed the assessment and treatment plan with the patient.  The patient was provided an opportunity to ask questions and all were answered.  The patient agreed with the plan and demonstrated an understanding of the instructions.  The patient was advised to call back if the symptoms worsen or if the condition fails to improve as anticipated.   Lequita Asal, MD, PhD    12/16/2019, 8:57 AM  I, Selena Batten, am acting as scribe for [employer]. Mike Gip, MD, PhD.  I, Taisa Deloria C. Mike Gip, MD, have reviewed the above documentation for accuracy and completeness, and I agree with the above.

## 2019-12-17 LAB — CANCER ANTIGEN 27.29: CA 27.29: 44.6 U/mL — ABNORMAL HIGH (ref 0.0–38.6)

## 2020-01-11 ENCOUNTER — Ambulatory Visit: Payer: Medicare PPO | Admitting: Family Medicine

## 2020-01-15 ENCOUNTER — Other Ambulatory Visit: Payer: Self-pay

## 2020-01-15 ENCOUNTER — Ambulatory Visit: Payer: Medicare PPO | Admitting: Family Medicine

## 2020-01-15 ENCOUNTER — Encounter: Payer: Self-pay | Admitting: Family Medicine

## 2020-01-15 VITALS — BP 138/72 | HR 86 | Temp 97.5°F | Resp 16 | Ht 65.0 in | Wt 164.7 lb

## 2020-01-15 DIAGNOSIS — I471 Supraventricular tachycardia: Secondary | ICD-10-CM | POA: Diagnosis not present

## 2020-01-15 DIAGNOSIS — M816 Localized osteoporosis [Lequesne]: Secondary | ICD-10-CM

## 2020-01-15 DIAGNOSIS — E785 Hyperlipidemia, unspecified: Secondary | ICD-10-CM | POA: Diagnosis not present

## 2020-01-15 DIAGNOSIS — I10 Essential (primary) hypertension: Secondary | ICD-10-CM

## 2020-01-15 MED ORDER — ALENDRONATE SODIUM 70 MG PO TABS: 70.0000 mg | ORAL_TABLET | ORAL | 3 refills | Status: DC

## 2020-01-15 MED ORDER — METOPROLOL SUCCINATE ER 50 MG PO TB24: 50.0000 mg | ORAL_TABLET | Freq: Every day | ORAL | 11 refills | Status: DC

## 2020-01-15 MED ORDER — ATORVASTATIN CALCIUM 10 MG PO TABS: ORAL_TABLET | ORAL | 3 refills | Status: DC

## 2020-01-15 NOTE — Patient Instructions (Addendum)
Health Maintenance  Topic Date Due   INFLUENZA VACCINE  03/06/2020   DEXA SCAN  06/11/2020   MAMMOGRAM  06/15/2020   COLONOSCOPY  06/14/2022   TETANUS/TDAP  08/26/2023   COVID-19 Vaccine  Completed   PNA vac Low Risk Adult  Completed   You are due for a repeat bone scan this November. Call the number below to schedule  [facility] at [facility] [place],  [place]  [id number] Get Driving Directions Main: [phone]  Your metoprolol is for your diagnosis of SVT - managed by cardiology, your blood pressure was good today.

## 2020-01-15 NOTE — Progress Notes (Signed)
Name: Toni Callahan   MRN: [id number]    DOB: 10/01/1940   Date:01/15/2020       Progress Note  Chief Complaint  Patient presents with  . Hyperlipidemia    follow up  . Hypertension    medication refills     Subjective:   Toni Callahan is a 79 y.o. adult, presents to clinic for routine follow up on the conditions listed above.  HLD - lipitor 10 mg - takes three times a week "its been like that forever" She would be due for her lipid panel today though CBC and CMP recently done with oncology lab work about 1 month ago She denies ever taking daily or having any myalgias or side effects with medications.  She denies any chest pain, palpitations, shortness of breath, claudication type symptoms.    Hypertension:  Currently managed on metoprolol Pt reports good med compliance and denies any SE-denies any fatigue, exertional dyspnea or other exertional symptoms she denies near syncope, lightheadedness, palpitations, chest pain or shortness of breath. Blood pressure today is well controlled. BP Readings from Last 3 Encounters:  01/15/20 138/72  12/16/19 (!) 123/55  11/03/19 118/62   Metoprolol for SVT - sees [facility] -she asks if her cardiologist is also managing this medication and I explained since she is new to me I will need to review the chart.  Reviewed after discussion does seem that she has a diagnosis of SVT most recent cardiology office visit reviewed through care everywhere-06/23/2019, echo was done 07/22/2019-reviewed results today, mildly enlarged right ventricle mildly enlarged right atrium, grade 1 relaxation abnormal left ventricle LVEF 55%-moderate tricuspid insufficiency  Osteoporosis - Vit D and calcium and fosamax ran out of? Patient is very unclear about some of her med history she is concerned about multiple changes in her PCP in clinic.  Last visit in person was here July last year was started on Fosamax medication at that time, was encouraged to follow-up  with her dentist and was instructed to get a 54-month follow-up appointment.'s unclear why she has not come back for routine follow-up.  She is worried about who will help her if she happens to get sick.    She did do her Medicare well visit Earlier this year in March.  Patient Active Problem List   Diagnosis Date Noted  . Localized osteoporosis without current pathological fracture 06/18/2018  . Medicare annual wellness visit, subsequent 09/11/2017  . Colon cancer screening 09/11/2017  . Hypertriglyceridemia 09/10/2016  . 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 Counseling regarding end of life decision making 09/18/2015  . Preventative health care 09/18/2015  . Medication monitoring encounter 09/18/2015  . Hyperglycemia 09/18/2015  . Postmenopausal 09/16/2015  . Screening for osteoporosis 09/16/2015  . Loose stools 07/26/2015  . Other fecal abnormalities 07/26/2015  . Hyperlipidemia LDL goal <100 04/28/2015  . Supraventricular tachycardia ([place]) 04/28/2015  . Abdominal pain 04/28/2015  . Benign essential HTN 09/16/2014  . TI (tricuspid incompetence) 09/16/2014  . 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 Presence of intraocular lens 09/10/2014  . Breast cancer of upper-inner quadrant of left female breast ([place]) 06/03/2014  . Breathlessness on exertion 05/17/2014    Past Surgical History:  Procedure Laterality Date  . BREAST BIOPSY Left 2009   positive  . BREAST CYST ASPIRATION Bilateral 2005   neg  . BREAST LUMPECTOMY Left 2009   positive  . BREAST SURGERY Left 2009   lumpectomy  . CATARACT EXTRACTION W/ INTRAOCULAR LENS  IMPLANT Bilateral   . COLONOSCOPY  [id number]   Dr. Candace Cruise  . COLONOSCOPY N/A 09/07/2015   Procedure: COLONOSCOPY;  Surgeon: Hulen Luster, MD;  Location: [place];  Service: Gastroenterology;  Laterality: N/A;  . COLONOSCOPY WITH PROPOFOL N/A 06/15/2019   Procedure: COLONOSCOPY WITH PROPOFOL;  Surgeon: Tol[place],  Benay Pike, MD;  Location: [facility] END[facility]COPY;  Service: Endoscopy;  Laterality: N/A;  . POLYPECTOMY  09/07/2015   Procedure: POLYPECTOMY;  Surgeon: Hulen Luster, MD;  Location: [place];  Service: Gastroenterology;;  . PORT-A-CATH REMOVAL  2011  . PORTACATH PLACEMENT  2009  . TONSILLECTOMY    . TONSILLECTOMY      Family History  Problem Relation Age of Onset  . Stroke Mother   . Heart disease Father   . Diabetes Brother   . Heart disease Paternal Grandmother   . Cancer Neg Hx   . COPD Neg Hx   . Hypertension Neg Hx   . Breast cancer Neg Hx     Social History   Tobacco Use  . Smoking status: Former Smoker    Years: 20.00    Types: Cigarettes    Quit date: 08/06/1984    Years since quitting: 35.4  . Smokeless tobacco: Never Used  . Tobacco comment: quit in 1986 approximately  Vaping Use  . Vaping Use: Never used  Substance Use Topics  . Alcohol use: Yes    Comment: drinks rarely  . Drug use: No      Current Outpatient Medications:  .  Apoaequorin (PREVAGEN PO), Take by mouth., Disp: , Rfl:  .  aspirin 81 MG tablet, Take 81 mg by mouth daily., Disp: , Rfl:  .  atorvastatin (LIPITOR) 10 MG tablet, One by mouth at bedtime three nights a week, Disp: 36 tablet, Rfl: 0 .  Biotin 5000 MCG CAPS, Take 1 capsule by mouth daily., Disp: , Rfl:  .  Calcium Carbonate (CALCIUM 600 PO), Take 1 tablet by mouth every other day. , Disp: , Rfl:  .  Cholecalciferol (VITAMIN D PO), Take 1,000 Int'l Units by mouth daily., Disp: , Rfl:  .  Cyanocobalamin (VITAMIN B 12 PO), Take 500 mg by mouth daily., Disp: , Rfl:  .  folic acid (FOLVITE) 811 MCG tablet, Take 400 mcg by mouth daily., Disp: , Rfl:  .  Magnesium 250 MG TABS, Take 1 tablet by mouth daily., Disp: , Rfl:  .  metoprolol succinate (TOPROL-XL) 50 MG 24 hr tablet, Take 1 tablet (50 mg total) by mouth daily., Disp: 30 tablet, Rfl: 11 .  Multiple Vitamin (MULTIVITAMIN) tablet, Take 1 tablet by mouth daily., Disp: , Rfl:  .  Omega-3  Fatty Acids (FISH OIL) 1000 MG CAPS, Take 1 capsule by mouth every other day. , Disp: , Rfl:  .  Potassium Gluconate 595 MG CAPS, Take by mouth daily., Disp: , Rfl:  .  vitamin C (ASCORBIC ACID) 500 MG tablet, Take 500 mg by mouth every other day. , Disp: , Rfl:  .  zinc gluconate 50 MG tablet, Take 50 mg by mouth daily., Disp: , Rfl:  .  alendronate (FOSAMAX) 70 MG tablet, Take 1 tablet (70 mg total) by mouth every 7 (seven) days. Take with a full glass of water on an empty stomach. (Patient not taking: Reported on 12/15/2019), Disp: 12 tablet, Rfl: 3  Allergies  Allergen Reactions  . Sulfa Antibiotics Nausea Only and Other (See Comments)    Upset stomach    Chart Review Today:  I personally reviewed active problem list, medication list, allergies, family history, social history, health maintenance, notes from last encounter, lab results, imaging with the patient/caregiver today.   Review of Systems  10 Systems reviewed and are negative for acute change except as noted in the HPI.  Objective:    Vitals:   01/15/20 1024  BP: 138/72  Pulse: 86  Resp: 16  Temp: (!) 97.5 F (36.4 C)  TempSrc: Temporal  SpO2: 98%  Weight: 164 lb 11.2 oz (74.7 kg)  Height: 5\' 5"  (1.651 m)    Body mass index is 27.41 kg/m.  Physical Exam Vitals and nursing note reviewed.  Constitutional:      General: She is not in acute distress.    Appearance: Normal appearance. She is not ill-appearing, toxic-appearing or diaphoretic.  HENT:     Head: Normocephalic and atraumatic.  Eyes:     General:        Right eye: No discharge.        Left eye: No discharge.     Conjunctiva/sclera: Conjunctivae normal.  Cardiovascular:     Rate and Rhythm: Normal rate and regular rhythm.     Pulses: Normal pulses.     Heart sounds: Normal heart sounds.  Pulmonary:     Effort: Pulmonary effort is normal. No respiratory distress.     Breath sounds: Normal breath sounds. No stridor. No wheezing, rhonchi or rales.    Abdominal:     General: Bowel sounds are normal. There is no distension.     Palpations: Abdomen is soft.  Musculoskeletal:     Right lower leg: No edema.     Left lower leg: No edema.  Skin:    Coloration: Skin is not jaundiced or pale.  Neurological:     Mental Status: She is alert.     Motor: No weakness.     Gait: Gait normal.  Psychiatric:        Mood and Affect: Mood normal.        Behavior: Behavior normal.      PHQ2/9: Depression screen [facility] 2/9 01/15/2020 11/03/2019 02/23/2019 09/18/2018 09/11/2017  Decreased Interest 0 0 0 0 0  Down, Depressed, Hopeless 0 0 0 0 0  PHQ - 2 Score 0 0 0 0 0  Altered sleeping 0 - 0 0 -  Tired, decreased energy 0 - 0 0 -  Change in appetite 0 - 0 0 -  Feeling bad or failure about yourself  0 - 0 0 -  Trouble concentrating 0 - 0 0 -  Moving slowly or fidgety/restless 0 - 0 0 -  Suicidal thoughts 0 - 0 0 -  PHQ-9 Score 0 - 0 0 -  Difficult doing work/chores Not difficult at all - Not difficult at all Not difficult at all -    phq 9 is neg reviewed  Fall Risk: Fall Risk  01/15/2020 11/03/2019 07/27/2019 02/23/2019 09/18/2018  Falls in the past year? 0 0 0 0 0  Number falls in past yr: 0 0 - 0 0  Injury with Fall? 0 0 - 0 0  Risk for fall due to : - No Fall Risks - - -  Follow up Falls evaluation completed Falls prevention discussed - - Falls prevention discussed     Assessment & Plan:   Pt new to me, mutliple providers/PCP turn over for pt, but she has also not come in as previously directed for 66-month follow-up so she is not been seen in clinic  in roughly a year.  She is unclear about some of her medications and does not remember much of what happened last office visit with the provider.  We reviewed the office visit notes, the after visit summary, her medications and the follow-up plan.  She is very upset and slightly frantic about " who will help her" in case she gets sick.  I explained to her that she will be transitioning care to me, we  will get to know each other but she will need to come at least twice a year to monitor her routine conditions and this will allow [place] time to further get to know each other.    ICD-10-CM   1. Hyperlipidemia LDL goal <100  E78.5 atorvastatin (LIPITOR) 10 MG tablet    Lipid panel   lipitor 3x a week, due for lipid panel  2. Benign essential HTN  I10 metoprolol succinate (TOPROL-XL) 50 MG 24 hr tablet   BP at goal today, well controlled Metoprolol not first-line for managing her hypertension but appears that she is on it for SVT, she notes her blood pressure monitoring at home systolic blood pressure runs about 120s to 130s, well controlled for age.  She has no side effects from the medication she is not having any symptoms concerning right now for uncontrolled SVT.  And will not currently add an additional medication for managing her blood pressure.  We will continue to monitor and would add lisinopril low-dose if she needed further BP control   3. Localized osteoporosis without current pathological fracture  M81.6 alendronate (FOSAMAX) 70 MG tablet    VITAMIN D 25 Hydroxy (Vit-D Deficiency, Fractures)    DG Bone Density  4. Supraventricular tachycardia ([facility])  I47.1 metoprolol succinate (TOPROL-XL) 50 MG 24 hr tablet   metoprolol managing, no palpitations, sees Nehemiah Massed   See #3-patient states she is not currently taking Fosamax but she is a difficult historian and its unclear whether or not she ran out of medications with med refills with change in providers or if she stopped taking it.  She does note that she took it for short time.  She is on vitamin D and calcium.  We reviewed her bone scan from 2019 I explained that she will be due for a follow-up bone scan later this year and it was ordered and information was given on how to schedule.  Did review her labs from oncology which were done about 1 month ago her calcium was normal, we will obtain her vitamin D today and I explained that we will need a  26-month follow-up in order to review her bone scan results her labs and adjust her treatment plan at that time.  We reviewed the clinic's phone number and how to contact us if she needs an appointment sooner for new problems or illness.  No follow-ups on file.   Delsa Grana, PA-C 01/15/20 10:58 AM

## 2020-01-16 LAB — VITAMIN D 25 HYDROXY (VIT D DEFICIENCY, FRACTURES): Vit D, 25-Hydroxy: 45 ng/mL (ref 30–100)

## 2020-01-16 LAB — LIPID PANEL
Cholesterol: 159 mg/dL (ref <200)
HDL: 39 mg/dL — ABNORMAL LOW (ref >=50)
LDL Cholesterol (Calc): 98 mg/dL (calc)
Non-HDL Cholesterol (Calc): 120 mg/dL (calc) (ref <130)
Total CHOL/HDL Ratio: 4.1 (calc) (ref <5.0)
Triglycerides: 127 mg/dL (ref <150)

## 2020-01-18 ENCOUNTER — Other Ambulatory Visit: Payer: Self-pay

## 2020-01-18 ENCOUNTER — Inpatient Hospital Stay: Payer: Medicare PPO | Attending: Hematology and Oncology

## 2020-01-18 DIAGNOSIS — Z170 Estrogen receptor positive status [ER+]: Secondary | ICD-10-CM | POA: Insufficient documentation

## 2020-01-18 DIAGNOSIS — M816 Localized osteoporosis [Lequesne]: Secondary | ICD-10-CM

## 2020-01-18 DIAGNOSIS — Z9223 Personal history of estrogen therapy: Secondary | ICD-10-CM | POA: Insufficient documentation

## 2020-01-18 DIAGNOSIS — C50212 Malignant neoplasm of upper-inner quadrant of left female breast: Secondary | ICD-10-CM

## 2020-01-18 DIAGNOSIS — Z853 Personal history of malignant neoplasm of breast: Secondary | ICD-10-CM | POA: Diagnosis not present

## 2020-01-19 LAB — CANCER ANTIGEN 27.29: CA 27.29: 32.5 U/mL (ref 0.0–38.6)

## 2020-02-26 ENCOUNTER — Encounter: Payer: Medicare PPO | Admitting: Family Medicine

## 2020-02-26 ENCOUNTER — Encounter: Payer: Medicare Other | Admitting: Family Medicine

## 2020-03-24 ENCOUNTER — Ambulatory Visit (INDEPENDENT_AMBULATORY_CARE_PROVIDER_SITE_OTHER): Payer: Medicare PPO | Admitting: Family Medicine

## 2020-03-24 ENCOUNTER — Encounter: Payer: Self-pay | Admitting: Family Medicine

## 2020-03-24 ENCOUNTER — Other Ambulatory Visit: Payer: Self-pay

## 2020-03-24 VITALS — BP 118/82 | HR 85 | Temp 98.3°F | Resp 16 | Ht 65.0 in | Wt 168.4 lb

## 2020-03-24 DIAGNOSIS — Z0000 Encounter for general adult medical examination without abnormal findings: Secondary | ICD-10-CM | POA: Diagnosis not present

## 2020-03-24 DIAGNOSIS — M810 Age-related osteoporosis without current pathological fracture: Secondary | ICD-10-CM | POA: Diagnosis not present

## 2020-03-24 DIAGNOSIS — Z780 Asymptomatic menopausal state: Secondary | ICD-10-CM

## 2020-03-24 NOTE — Progress Notes (Signed)
Patient: Toni Callahan, Adult    DOB: 06/30/1941, 79 y.o.   MRN: [id number] Delsa Grana, PA-C Visit Date: 03/24/2020  Today's Provider: Delsa Grana, PA-C   Chief Complaint  Patient presents with  . Annual Exam   Subjective:   Annual physical exam:  Toni Callahan is a 79 y.o. adult who presents today for complete physical exam:  Exercise/Activity:Mows her own yard once a week  Diet/nutrition:well balanced  Sleep: 7-8hrs per night  [place], willing to see Allen Norris - possibly last colonoscopy  Osteoporosis: On her second round of fosamax Does her yard work and housework, no dysphagia, no jaw/dental issues Moving all the time- a lot of physical work daily  06/16/2020 - scheduled mammogram at [facility]   USPSTF grade A and B recommendations - reviewed and addressed today  Depression:  Phq 9 completed today by patient, was reviewed by me with patient in the room PHQ score is 0, pt feels well PHQ 2/9 Scores 01/15/2020 11/03/2019 02/23/2019 09/18/2018  PHQ - 2 Score 0 0 0 0  PHQ- 9 Score 0 - 0 0   Depression screen [facility] 2/9 01/15/2020 11/03/2019 02/23/2019 09/18/2018 09/11/2017  Decreased Interest 0 0 0 0 0  Down, Depressed, Hopeless 0 0 0 0 0  PHQ - 2 Score 0 0 0 0 0  Altered sleeping 0 - 0 0 -  Tired, decreased energy 0 - 0 0 -  Change in appetite 0 - 0 0 -  Feeling bad or failure about yourself  0 - 0 0 -  Trouble concentrating 0 - 0 0 -  Moving slowly or fidgety/restless 0 - 0 0 -  Suicidal thoughts 0 - 0 0 -  PHQ-9 Score 0 - 0 0 -  Difficult doing work/chores Not difficult at all - Not difficult at all Not difficult at all -    Alcohol screening:   Office Visit from 01/15/2020 in [facility]  AUDIT-C Score 0      Immunizations and Health Maintenance: Health Maintenance  Topic Date Due  . INFLUENZA VACCINE  03/06/2020  . DEXA SCAN  06/11/2020  . MAMMOGRAM  06/15/2020  . COLONOSCOPY  06/14/2022  . TETANUS/TDAP   08/26/2023  . COVID-19 Vaccine  Completed  . PNA vac Low Risk Adult  Completed     STD testing and prevention: Denies/N/a   Intimate partner violence:  married/NO  Sexual History/Pain during Intercourse: Married   Menstrual History/LMP/Abnormal Bleeding: Pt denies No LMP recorded. Patient is postmenopausal.  Incontinence Symptoms: Pt denies  Breast cancer:  Last Mammogram: see HM list above: Pt does have HX of breast cancer,  see's oncologist who orders. Her next appt for mamo already scheduled for: 06/16/20   Osteoporosis:   Discussion on osteoporosis per age, including high calcium and vitamin D supplementation, weight bearing exercises Pt is supplementing with daily calcium/Vit D.  Bone scan/dexa: last 06/11/2018, ordered pt will call to schedule Roughly experienced menopause at age late 45's  Skin cancer:  Hx of skin CA -  NO Discussed atypical lesions   Colorectal cancer:   Last Colonoscopy was 06/15/19 and to follow-up in 3years Discussed concerning signs and sx of CRC, pt denies   Lung cancer:   Low Dose CT Chest recommended if Age 81-80 years, 30 pack-year currently smoking OR have quit w/in 15years. Patient does not qualify.    Social History   Tobacco Use  . Smoking status: Former Smoker  Years: 20.00    Types: Cigarettes    Quit date: 08/06/1984    Years since quitting: 35.6  . Smokeless tobacco: Never Used  . Tobacco comment: quit in 1986 approximately  Vaping Use  . Vaping Use: Never used  Substance Use Topics  . Alcohol use: Yes    Comment: drinks rarely  . Drug use: No       Office Visit from 01/15/2020 in [facility]  AUDIT-C Score 0      Family History  Problem Relation Age of Onset  . Stroke Mother   . Heart disease Father   . Diabetes Brother   . Heart disease Paternal Grandmother   . Cancer Neg Hx   . COPD Neg Hx   . Hypertension Neg Hx   . Breast cancer Neg Hx      Blood pressure/Hypertension: BP Readings  from Last 3 Encounters:  01/15/20 138/72  12/16/19 (!) 123/55  11/03/19 118/62    Weight/Obesity: Wt Readings from Last 3 Encounters:  01/15/20 164 lb 11.2 oz (74.7 kg)  12/16/19 164 lb 7.4 oz (74.6 kg)  11/03/19 165 lb (74.8 kg)   BMI Readings from Last 3 Encounters:  01/15/20 27.41 kg/m  12/16/19 27.37 kg/m  11/03/19 27.46 kg/m     Lipids:  Lab Results  Component Value Date   CHOL 159 01/15/2020   CHOL 162 02/23/2019   CHOL 168 07/24/2018   Lab Results  Component Value Date   HDL 39 (L) 01/15/2020   HDL 42 (L) 02/23/2019   HDL 35 (L) 07/24/2018   Lab Results  Component Value Date   LDLCALC 98 01/15/2020   LDLCALC 91 02/23/2019   LDLCALC 96 07/24/2018   Lab Results  Component Value Date   TRIG 127 01/15/2020   TRIG 191 (H) 02/23/2019   TRIG 261 (H) 07/24/2018   Lab Results  Component Value Date   CHOLHDL 4.1 01/15/2020   CHOLHDL 3.9 02/23/2019   CHOLHDL 4.8 07/24/2018   No results found for: LDLDIRECT Based on the results of lipid panel his/her cardiovascular risk factor ( using [place] )  in the next 10 years is: The ASCVD Risk score Mikey Bussing DC Jr., et al., 2013) failed to calculate for the following reasons:   The 2013 ASCVD risk score is only valid for ages 11 to 10 Glucose:  Glucose  Date Value Ref Range Status  04/30/2014 90 65 - 99 mg/dL Final  10/16/2013 109 (H) 65 - 99 mg/dL Final  03/20/2013 96 65 - 99 mg/dL Final   Glucose, Bld  Date Value Ref Range Status  12/16/2019 96 70 - 99 mg/dL Final    Comment:    Glucose reference range applies only to samples taken after fasting for at least 8 hours.  12/17/2018 123 (H) 70 - 99 mg/dL Final  06/18/2018 101 (H) 70 - 99 mg/dL Final   Hypertension: BP Readings from Last 3 Encounters:  01/15/20 138/72  12/16/19 (!) 123/55  11/03/19 118/62   Obesity: Wt Readings from Last 3 Encounters:  01/15/20 164 lb 11.2 oz (74.7 kg)  12/16/19 164 lb 7.4 oz (74.6 kg)  11/03/19 165 lb (74.8 kg)    BMI Readings from Last 3 Encounters:  01/15/20 27.41 kg/m  12/16/19 27.37 kg/m  11/03/19 27.46 kg/m      Advanced Care Planning:  A voluntary discussion about advance care planning including the explanation and discussion of advance directives.   Discussed health care proxy and Living will,  and the patient was able to identify a health care proxy as Jerold Coombe. (husband).   Patient does have a living will at present time.   Copy on file?  unsure  Social History      She        Social History   Socioeconomic History  . Marital status: Married    Spouse name: Not on file  . Number of children: 0  . Years of education: Not on file  . Highest education level: Bachelor's degree (e.g., BA, AB, BS)  Occupational History  . Not on file  Tobacco Use  . Smoking status: Former Smoker    Years: 20.00    Types: Cigarettes    Quit date: 08/06/1984    Years since quitting: 35.6  . Smokeless tobacco: Never Used  . Tobacco comment: quit in 1986 approximately  Vaping Use  . Vaping Use: Never used  Substance and Sexual Activity  . Alcohol use: Yes    Comment: drinks rarely  . Drug use: No  . Sexual activity: Yes  Other Topics Concern  . Not on file  Social History Narrative  . Not on file   Social Determinants of Health   Financial Resource Strain: Low Risk   . Difficulty of Paying Living Expenses: Not hard at all  Food Insecurity: No Food Insecurity  . Worried About Charity fundraiser in the Last Year: Never true  . Ran Out of Food in the Last Year: Never true  Transportation Needs: No Transportation Needs  . Lack of Transportation (Medical): No  . Lack of Transportation (Non-Medical): No  Physical Activity: Insufficiently Active  . Days of Exercise per Week: 2 days  . Minutes of Exercise per Session: 60 min  Stress: No Stress Concern Present  . Feeling of Stress : Not at all  Social Connections: Moderately Integrated  . Frequency of Communication with  Friends and Family: More than three times a week  . Frequency of Social Gatherings with Friends and Family: Three times a week  . Attends Religious Services: More than 4 times per year  . Active Member of Clubs or Organizations: No  . Attends Archivist Meetings: Never  . Marital Status: Married    Family History        Family History  Problem Relation Age of Onset  . Stroke Mother   . Heart disease Father   . Diabetes Brother   . Heart disease Paternal Grandmother   . Cancer Neg Hx   . COPD Neg Hx   . Hypertension Neg Hx   . Breast cancer Neg Hx     Patient Active Problem List   Diagnosis Date Noted  . Localized osteoporosis without current pathological fracture 06/18/2018  . Medicare annual wellness visit, subsequent 09/11/2017  . Colon cancer screening 09/11/2017  . Hypertriglyceridemia 09/10/2016  . 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 Counseling regarding end of life decision making 09/18/2015  . Preventative health care 09/18/2015  . Medication monitoring encounter 09/18/2015  . Hyperglycemia 09/18/2015  . Postmenopausal 09/16/2015  . Screening for osteoporosis 09/16/2015  . Loose stools 07/26/2015  . Other fecal abnormalities 07/26/2015  . Hyperlipidemia LDL goal <100 04/28/2015  . Supraventricular tachycardia ([place]) 04/28/2015  . Abdominal pain 04/28/2015  . Benign essential HTN 09/16/2014  . TI (tricuspid incompetence) 09/16/2014  . 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 Presence of intraocular lens 09/10/2014  . Breast cancer of upper-inner quadrant of  left female breast ([place]) 06/03/2014  . Breathlessness on exertion 05/17/2014    Past Surgical History:  Procedure Laterality Date  . BREAST BIOPSY Left 2009   positive  . BREAST CYST ASPIRATION Bilateral 2005   neg  . BREAST LUMPECTOMY Left 2009   positive  . BREAST SURGERY Left 2009   lumpectomy  . CATARACT EXTRACTION W/ INTRAOCULAR LENS  IMPLANT Bilateral   . COLONOSCOPY  [id number],[id number]   Dr. Candace Cruise  . COLONOSCOPY N/A 09/07/2015   Procedure: COLONOSCOPY;  Surgeon: Hulen Luster, MD;  Location: [place];  Service: Gastroenterology;  Laterality: N/A;  . COLONOSCOPY WITH PROPOFOL N/A 06/15/2019   Procedure: COLONOSCOPY WITH PROPOFOL;  Surgeon: Toledo, Benay Pike, MD;  Location: [facility] ENDOSCOPY;  Service: Endoscopy;  Laterality: N/A;  . POLYPECTOMY  09/07/2015   Procedure: POLYPECTOMY;  Surgeon: Hulen Luster, MD;  Location: [place];  Service: Gastroenterology;;  . PORT-A-CATH REMOVAL  2011  . PORTACATH PLACEMENT  2009  . TONSILLECTOMY    . TONSILLECTOMY       Current Outpatient Medications:  .  alendronate (FOSAMAX) 70 MG tablet, Take 1 tablet (70 mg total) by mouth every 7 (seven) days. Take with a full glass of water on an empty stomach., Disp: 12 tablet, Rfl: 3 .  Apoaequorin (PREVAGEN PO), Take by mouth., Disp: , Rfl:  .  aspirin 81 MG tablet, Take 81 mg by mouth daily., Disp: , Rfl:  .  atorvastatin (LIPITOR) 10 MG tablet, One by mouth at bedtime three nights a week, Disp: 36 tablet, Rfl: 3 .  Biotin 5000 MCG CAPS, Take 1 capsule by mouth daily., Disp: , Rfl:  .  Calcium Carbonate (CALCIUM 600 PO), Take 1 tablet by mouth every other day. , Disp: , Rfl:  .  Cholecalciferol (VITAMIN D PO), Take 1,000 Int'l Units by mouth daily., Disp: , Rfl:  .  Cyanocobalamin (VITAMIN B 12 PO), Take 500 mg by mouth daily., Disp: , Rfl:  .  folic acid (FOLVITE) 540 MCG tablet, Take 400 mcg by mouth daily., Disp: , Rfl:  .  Magnesium 250 MG TABS, Take 1 tablet by mouth daily., Disp: , Rfl:  .  metoprolol succinate (TOPROL-XL) 50 MG 24 hr tablet, Take 1 tablet (50 mg total) by mouth daily., Disp: 30 tablet, Rfl: 11 .  Multiple Vitamin (MULTIVITAMIN) tablet, Take 1 tablet by mouth daily., Disp: , Rfl:  .  Omega-3 Fatty Acids (FISH OIL) 1000 MG CAPS, Take 1 capsule by mouth every other day. , Disp: , Rfl:  .  Potassium Gluconate 595 MG CAPS,  Take by mouth daily., Disp: , Rfl:  .  vitamin C (ASCORBIC ACID) 500 MG tablet, Take 500 mg by mouth every other day. , Disp: , Rfl:  .  zinc gluconate 50 MG tablet, Take 50 mg by mouth daily., Disp: , Rfl:   Allergies  Allergen Reactions  . Sulfa Antibiotics Nausea Only and Other (See Comments)    Upset stomach    Patient Care Team: Delsa Grana, PA-C as PCP - General (Family Medicine) Corey Skains, MD as Consulting Physician (Cardiology) Bary Castilla, Forest Gleason, MD (General Surgery) Lequita Asal, MD as Referring Physician (Hematology and Oncology) Efrain Sella, MD as Consulting Physician (Gastroenterology)     I personally reviewed active problem list, medication list, allergies, family history, social history, health maintenance, notes from last encounter, lab results, imaging with the patient/caregiver today.  Review of Systems  Constitutional: Negative.  Negative for activity  change, appetite change, fatigue and unexpected weight change.  HENT: Negative.   Eyes: Negative.   Respiratory: Negative.  Negative for shortness of breath.   Cardiovascular: Negative.  Negative for chest pain, palpitations and leg swelling.  Gastrointestinal: Negative.  Negative for abdominal pain and blood in stool.  Endocrine: Negative.   Genitourinary: Negative.   Musculoskeletal: Negative.  Negative for arthralgias, gait problem, joint swelling and myalgias.  Skin: Negative.  Negative for pallor and rash.  Allergic/Immunologic: Negative.   Neurological: Negative.  Negative for syncope and weakness.  Hematological: Negative.   Psychiatric/Behavioral: Negative.  Negative for dysphoric mood, self-injury and suicidal ideas. The patient is not nervous/anxious.   All other systems reviewed and are negative.         Objective:   Vitals:  Vitals:   03/24/20 1424  BP: 118/82  Pulse: 85  Resp: 16  Temp: 98.3 F (36.8 C)  SpO2: 97%  Weight: 168 lb 6.4 oz (76.4 kg)  Height: 5\' 5"   (1.651 m)    Body mass index is 28.02 kg/m.  Physical Exam Vitals and nursing note reviewed.  Constitutional:      General: She is not in acute distress.    Appearance: Normal appearance. She is well-developed. She is not ill-appearing, toxic-appearing or diaphoretic.     Interventions: Face mask in place.  HENT:     Head: Normocephalic and atraumatic.     Right Ear: External ear normal.     Left Ear: External ear normal.  Eyes:     General: Lids are normal. No scleral icterus.       Right eye: No discharge.        Left eye: No discharge.     Conjunctiva/sclera: Conjunctivae normal.  Neck:     Trachea: Phonation normal. No tracheal deviation.  Cardiovascular:     Rate and Rhythm: Normal rate and regular rhythm.     Pulses: Normal pulses.          Radial pulses are 2+ on the right side and 2+ on the left side.       Posterior tibial pulses are 2+ on the right side and 2+ on the left side.     Heart sounds: Normal heart sounds. No murmur heard.  No friction rub. No gallop.   Pulmonary:     Effort: Pulmonary effort is normal. No respiratory distress.     Breath sounds: Normal breath sounds. No stridor. No wheezing, rhonchi or rales.  Chest:     Chest wall: No tenderness.  Abdominal:     General: Bowel sounds are normal. There is no distension.     Palpations: Abdomen is soft.  Musculoskeletal:     Right lower leg: No edema.     Left lower leg: No edema.  Skin:    General: Skin is warm and dry.     Coloration: Skin is not jaundiced or pale.     Findings: No rash.  Neurological:     Mental Status: She is alert.     Motor: No abnormal muscle tone.     Gait: Gait normal.  Psychiatric:        Mood and Affect: Mood normal.        Speech: Speech normal.        Behavior: Behavior normal.       Fall Risk: Fall Risk  01/15/2020 11/03/2019 07/27/2019 02/23/2019 09/18/2018  Falls in the past year? 0 0 0 0 0  Number falls in past  yr: 0 0 - 0 0  Injury with Fall? 0 0 - 0 0    Risk for fall due to : - No Fall Risks - - -  Follow up Falls evaluation completed Falls prevention discussed - - Falls prevention discussed    Functional Status Survey:     Assessment & Plan:    CPE completed today  . USPSTF grade A and B recommendations reviewed with patient; age-appropriate recommendations, preventive care, screening tests, etc discussed and encouraged; healthy living encouraged; see AVS for patient education given to patient  . Discussed importance of 150 minutes of physical activity weekly, AHA exercise recommendations given to pt in AVS/handout  . Discussed importance of healthy diet:  eating lean meats and proteins, avoiding trans fats and saturated fats, avoid simple sugars and excessive carbs in diet, eat 6 servings of fruit/vegetables daily and drink plenty of water and avoid sweet beverages.    . Recommended pt to do annual eye exam and routine dental exams/cleanings  . Depression, alcohol, fall screening completed as documented above and per flowsheets  . Reviewed Health Maintenance: Health Maintenance  Topic Date Due  . INFLUENZA VACCINE  03/06/2020  . DEXA SCAN  06/11/2020  . MAMMOGRAM  06/15/2020  . COLONOSCOPY  06/14/2022  . TETANUS/TDAP  08/26/2023  . COVID-19 Vaccine  Completed  . PNA vac Low Risk Adult  Completed    . Immunizations: Immunization History  Administered Date(s) Administered  . Fluad Quad(high Dose 65+) 04/24/2019  . Influenza,inj,quad, With Preservative 05/02/2018  . Influenza-Unspecified 04/09/2015, 04/11/2016, 05/02/2018  . Moderna SARS-COVID-2 Vaccination 08/18/2019, 09/15/2019  . Pneumococcal Conjugate-13 03/02/2014  . Pneumococcal Polysaccharide-23 05/01/2005  . Td 08/07/2003  . Tdap 08/25/2013  . Zoster 05/16/2006  . Zoster Recombinat (Shingrix) 06/07/2017, 09/06/2017      ICD-10-CM   1. Annual physical exam  Z00.00   2. Postmenopausal estrogen deficiency  Z78.0 DG Bone Density  3. Age-related osteoporosis  without current pathological fracture  M81.0 DG Bone Density         Delsa Grana, PA-C 03/24/20 1:37 PM  [facility]

## 2020-03-24 NOTE — Patient Instructions (Addendum)
Health Maintenance  Topic Date Due  . Flu Shot  03/06/2020  . DEXA scan (bone density measurement)  06/11/2020  . Mammogram  06/15/2020  . Colon Cancer Screening  06/14/2022  . Tetanus Vaccine  08/26/2023  . COVID-19 Vaccine  Completed  . Pneumonia vaccines  Completed    Vit D 1000 IU daily and Calcium 1200 mg daily   Preventing Osteoporosis, Adult Osteoporosis is a condition that causes the bones to lose density. This means that the bones become thinner, and the normal spaces in bone tissue become larger. Low bone density can make the bones weak and cause them to break more easily. Osteoporosis cannot always be prevented, but you can take steps to lower your risk of developing this condition. How can this condition affect me? If you develop osteoporosis, you will be more likely to break bones in your wrist, spine, or hip. Even a minor accident or injury can be enough to break weak bones. The bones will also be slower to heal. Osteoporosis can cause other problems as well, such as a stooped posture or trouble with movement. Osteoporosis can occur with aging. As you get older, you may lose bone tissue more quickly, or it may be replaced more slowly. Osteoporosis is more likely to develop if you have poor nutrition or do not get enough calcium or vitamin D. Other lifestyle factors can also play a role. By eating a well-balanced diet and making lifestyle changes, you can help keep your bones strong and healthy, lowering your chances of developing osteoporosis. What can increase my risk? The following factors may make you more likely to develop osteoporosis:  Having a family history of the condition.  Having poor nutrition or not getting enough calcium or vitamin D.  Using certain medicines, such as steroid medicines or antiseizure medicines.  Being any of the following: ? 53 years of age or older. ? Female. ? A woman who has gone through menopause (is postmenopausal). ? White  (Caucasian) or of Asian descent.  Smoking or having a history of smoking.  Not being physically active (being sedentary).  Having a small body frame. What actions can I take to prevent this?  Get enough calcium   Make sure you get enough calcium every day. Calcium is the most important mineral for bone health. Most people can get enough calcium from their diet, but supplements may be recommended for people who are at risk for osteoporosis. Follow these guidelines: ? If you are age 28 or younger, aim to get 1,000 mg of calcium every day. ? If you are older than age 75, aim to get 1,200 mg of calcium every day.  Good sources of calcium include: ? Dairy products, such as low-fat or nonfat milk, cheese, and yogurt. ? Dark green leafy vegetables, such as bok choy and broccoli. ? Foods that have had calcium added to them (calcium-fortified foods), such as orange juice, cereal, bread, soy beverages, and tofu products. ? Nuts, such as almonds.  Check nutrition labels to see how much calcium is in a food or drink. Get enough vitamin D  Try to get enough vitamin D every day. Vitamin D is the most essential vitamin for bone health. It helps the body absorb calcium. Follow these guidelines for how much vitamin D to get from food: ? If you are age 41 or younger, aim to get at least 600 international units (IU) every day. Your health care provider may suggest more. ? If you are  older than age 3, aim to get at least 800 international units every day. Your health care provider may suggest more.  Good sources of vitamin D in your diet include: ? Egg yolks. ? Oily fish, such as salmon, sardines, and tuna. ? Milk and cereal fortified with vitamin D.  Your body also makes vitamin D when you are out in the sun. Exposing the bare skin on your face, arms, legs, or back to the sun for no more than 30 minutes a day, 2 times a week is more than enough. Beyond that, make sure you use sunblock to protect your  skin from sunburn, which increases your risk for skin cancer. Exercise  Stay active and get exercise every day.  Ask your health care provider what types of exercise are best for you. Weight-bearing and strength-building activities are important for building and maintaining healthy bones. Some examples of these types of activities include: ? Walking and hiking. ? Jogging and running. ? Dancing. ? Gym exercises. ? Lifting weights. ? Tennis and racquetball. ? Climbing stairs. ? Aerobics. Make other lifestyle changes  Do not use any products that contain nicotine or tobacco, such as cigarettes, e-cigarettes, and chewing tobacco. If you need help quitting, ask your health care provider.  Lose weight if you are overweight.  If you drink alcohol: ? Limit how much you use to:  0-1 drink a day for nonpregnant women.  0-2 drinks a day for men. ? Be aware of how much alcohol is in your drink. In the [place] one drink equals one 12 oz bottle of beer (355 mL), one 5 oz glass of wine (148 mL), or one 1 oz glass of hard liquor (44 mL). Where to find support If you need help making changes to prevent osteoporosis, talk with your health care provider. You can ask for a referral to a diet and nutrition specialist (dietitian) and a physical therapist. Where to find more information Learn more about osteoporosis from:  NIH Osteoporosis and Related [place]: www.bones.SouthExposed.es  [place]S. Office on [employer]: [place]Signs.tn  [place]: EquipmentWeekly.com.ee Summary  Osteoporosis is a condition that causes weak bones that are more likely to break.  Eat a healthy diet, making sure you get enough calcium and vitamin D, and stay active by getting regular exercise to help prevent osteoporosis.  Other ways to reduce your risk of osteoporosis include maintaining a healthy weight and avoiding alcohol and products that contain nicotine or tobacco. This  information is not intended to replace advice given to you by your health care provider. Make sure you discuss any questions you have with your health care provider. Document Revised: 02/20/2019 Document Reviewed: 02/20/2019 Elsevier Patient Education  [place] 65 Years and Older, Female Preventive care refers to lifestyle choices and visits with your health care provider that can promote health and wellness. This includes:  A yearly physical exam. This is also called an annual well check.  Regular dental and eye exams.  Immunizations.  Screening for certain conditions.  Healthy lifestyle choices, such as diet and exercise. What can I expect for my preventive care visit? Physical exam Your health care provider will check:  Height and weight. These may be used to calculate body mass index (BMI), which is a measurement that tells if you are at a healthy weight.  Heart rate and blood pressure.  Your skin for abnormal spots. Counseling Your health care provider may ask you questions  about:  Alcohol, tobacco, and drug use.  Emotional well-being.  Home and relationship well-being.  Sexual activity.  Eating habits.  History of falls.  Memory and ability to understand (cognition).  Work and work Statistician.  Pregnancy and menstrual history. What immunizations do I need?  Influenza (flu) vaccine  This is recommended every year. Tetanus, diphtheria, and pertussis (Tdap) vaccine  You may need a Td booster every 10 years. Varicella (chickenpox) vaccine  You may need this vaccine if you have not already been vaccinated. Zoster (shingles) vaccine  You may need this after age 71. Pneumococcal conjugate (PCV13) vaccine  One dose is recommended after age 70. Pneumococcal polysaccharide (PPSV23) vaccine  One dose is recommended after age 83. Measles, mumps, and rubella (MMR) vaccine  You may need at least one dose of MMR if you were born  in 1957 or later. You may also need a second dose. Meningococcal conjugate (MenACWY) vaccine  You may need this if you have certain conditions. Hepatitis A vaccine  You may need this if you have certain conditions or if you travel or work in places where you may be exposed to hepatitis A. Hepatitis B vaccine  You may need this if you have certain conditions or if you travel or work in places where you may be exposed to hepatitis B. Haemophilus influenzae type b (Hib) vaccine  You may need this if you have certain conditions. You may receive vaccines as individual doses or as more than one vaccine together in one shot (combination vaccines). Talk with your health care provider about the risks and benefits of combination vaccines. What tests do I need? Blood tests  Lipid and cholesterol levels. These may be checked every 5 years, or more frequently depending on your overall health.  Hepatitis C test.  Hepatitis B test. Screening  Lung cancer screening. You may have this screening every year starting at age 6 if you have a 30-pack-year history of smoking and currently smoke or have quit within the past 15 years.  Colorectal cancer screening. All adults should have this screening starting at age 52 and continuing until age 42. Your health care provider may recommend screening at age 73 if you are at increased risk. You will have tests every 1-10 years, depending on your results and the type of screening test.  Diabetes screening. This is done by checking your blood sugar (glucose) after you have not eaten for a while (fasting). You may have this done every 1-3 years.  Mammogram. This may be done every 1-2 years. Talk with your health care provider about how often you should have regular mammograms.  BRCA-related cancer screening. This may be done if you have a family history of breast, ovarian, tubal, or peritoneal cancers. Other tests  Sexually transmitted disease (STD)  testing.  Bone density scan. This is done to screen for osteoporosis. You may have this done starting at age 28. Follow these instructions at home: Eating and drinking  Eat a diet that includes fresh fruits and vegetables, whole grains, lean protein, and low-fat dairy products. Limit your intake of foods with high amounts of sugar, saturated fats, and salt.  Take vitamin and mineral supplements as recommended by your health care provider.  Do not drink alcohol if your health care provider tells you not to drink.  If you drink alcohol: ? Limit how much you have to 0-1 drink a day. ? Be aware of how much alcohol is in your drink. In the U.S., one  drink equals one 12 oz bottle of beer (355 mL), one 5 oz glass of wine (148 mL), or one 1 oz glass of hard liquor (44 mL). Lifestyle  Take daily care of your teeth and gums.  Stay active. Exercise for at least 30 minutes on 5 or more days each week.  Do not use any products that contain nicotine or tobacco, such as cigarettes, e-cigarettes, and chewing tobacco. If you need help quitting, ask your health care provider.  If you are sexually active, practice safe sex. Use a condom or other form of protection in order to prevent STIs (sexually transmitted infections).  Talk with your health care provider about taking a low-dose aspirin or statin. What's next?  Go to your health care provider once a year for a well check visit.  Ask your health care provider how often you should have your eyes and teeth checked.  Stay up to date on all vaccines. This information is not intended to replace advice given to you by your health care provider. Make sure you discuss any questions you have with your health care provider. Document Revised: 07/17/2018 Document Reviewed: 07/17/2018 Elsevier Patient Education  2020 [employer].

## 2020-06-07 ENCOUNTER — Telehealth: Payer: Self-pay

## 2020-06-07 NOTE — Telephone Encounter (Signed)
Copied from [place] [id number]. Topic: General - Other >> Jun 07, 2020  9:05 AM Rainey Pines A wrote: Patient would like to speak with [facility] nurse I nregards to bone density exam and mammogram scheduling. Please advise

## 2020-06-07 NOTE — Telephone Encounter (Signed)
Called patient back NA 

## 2020-06-16 ENCOUNTER — Other Ambulatory Visit: Payer: Self-pay

## 2020-06-16 ENCOUNTER — Ambulatory Visit
Admission: RE | Admit: 2020-06-16 | Discharge: 2020-06-16 | Disposition: A | Discharge status: Home / Self Care | Payer: Medicare PPO | Source: Ambulatory Visit | Attending: Hematology and Oncology | Admitting: Hematology and Oncology

## 2020-06-16 DIAGNOSIS — C50212 Malignant neoplasm of upper-inner quadrant of left female breast: Secondary | ICD-10-CM

## 2020-06-16 DIAGNOSIS — Z1231 Encounter for screening mammogram for malignant neoplasm of breast: Secondary | ICD-10-CM | POA: Diagnosis present

## 2020-07-04 ENCOUNTER — Other Ambulatory Visit: Payer: Self-pay

## 2020-07-04 ENCOUNTER — Encounter: Payer: Self-pay | Admitting: Surgery

## 2020-07-04 ENCOUNTER — Ambulatory Visit (INDEPENDENT_AMBULATORY_CARE_PROVIDER_SITE_OTHER): Payer: Medicare PPO | Admitting: Surgery

## 2020-07-04 VITALS — BP 132/76 | HR 73 | Temp 97.7°F | Ht 65.0 in | Wt 164.0 lb

## 2020-07-04 DIAGNOSIS — C50212 Malignant neoplasm of upper-inner quadrant of left female breast: Secondary | ICD-10-CM | POA: Diagnosis not present

## 2020-07-04 NOTE — Patient Instructions (Addendum)
We will reach out to you in October 2022 to schedule your Bilateral screening Mammogram and follow up appointment with Dr.Pabon for November 2022. If you do not hear from our office by the end of October please call our office.    Breast Self-Awareness Breast self-awareness is knowing how your breasts look and feel. Doing breast self-awareness is important. It allows you to catch a breast problem early while it is still small and can be treated. All women should do breast self-awareness, including women who have had breast implants. Tell your doctor if you notice a change in your breasts. What you need:  A mirror.  A well-lit room. How to do a breast self-exam A breast self-exam is one way to learn what is normal for your breasts and to check for changes. To do a breast self-exam: Look for changes  1. Take off all the clothes above your waist. 2. Stand in front of a mirror in a room with good lighting. 3. Put your hands on your hips. 4. Push your hands down. 5. Look at your breasts and nipples in the mirror to see if one breast or nipple looks different from the other. Check to see if: ? The shape of one breast is different. ? The size of one breast is different. ? There are wrinkles, dips, and bumps in one breast and not the other. 6. Look at each breast for changes in the skin, such as: ? Redness. ? Scaly areas. 7. Look for changes in your nipples, such as: ? Liquid around the nipples. ? Bleeding. ? Dimpling. ? Redness. ? A change in where the nipples are. Feel for changes  1. Lie on your back on the floor. 2. Feel each breast. To do this, follow these steps: ? Pick a breast to feel. ? Put the arm closest to that breast above your head. ? Use your other arm to feel the nipple area of your breast. Feel the area with the pads of your three middle fingers by making small circles with your fingers. For the first circle, press lightly. For the second circle, press harder. For the  third circle, press even harder. ? Keep making circles with your fingers at the different pressures as you move down your breast. Stop when you feel your ribs. ? Move your fingers a little toward the center of your body. ? Start making circles with your fingers again, this time going up until you reach your collarbone. ? Keep making up-and-down circles until you reach your armpit. Remember to keep using the three pressures. ? Feel the other breast in the same way. 3. Sit or stand in the tub or shower. 4. With soapy water on your skin, feel each breast the same way you did in step 2 when you were lying on the floor. Write down what you find Writing down what you find can help you remember what to tell your doctor. Write down:  What is normal for each breast.  Any changes you find in each breast, including: ? The kind of changes you find. ? Whether you have pain. ? Size and location of any lumps.  When you last had your menstrual period. General tips  Check your breasts every month.  If you are breastfeeding, the best time to check your breasts is after you feed your baby or after you use a breast pump.  If you get menstrual periods, the best time to check your breasts is 5-7 days after your menstrual  period is over.  With time, you will become comfortable with the self-exam, and you will begin to know if there are changes in your breasts. Contact a doctor if you:  See a change in the shape or size of your breasts or nipples.  See a change in the skin of your breast or nipples, such as red or scaly skin.  Have fluid coming from your nipples that is not normal.  Find a lump or thick area that was not there before.  Have pain in your breasts.  Have any concerns about your breast health. Summary  Breast self-awareness includes looking for changes in your breasts, as well as feeling for changes within your breasts.  Breast self-awareness should be done in front of a mirror in a  well-lit room.  You should check your breasts every month. If you get menstrual periods, the best time to check your breasts is 5-7 days after your menstrual period is over.  Let your doctor know of any changes you see in your breasts, including changes in size, changes on the skin, pain or tenderness, or fluid from your nipples that is not normal. This information is not intended to replace advice given to you by your health care provider. Make sure you discuss any questions you have with your health care provider. Document Revised: 03/11/2018 Document Reviewed: 03/11/2018 Elsevier Patient Education  [place].

## 2020-07-06 NOTE — Progress Notes (Signed)
Outpatient Surgical Follow Up  07/06/2020  Toni Callahan is an 79 y.o. female.   Chief Complaint  Patient presents with  . Follow-up    1 yr mammogram /breast exam    HPI: Toni Callahan is  an 79 year old female with history of left breast cancer status post lumpectomy and sentinel lymph node biopsy and  Chemo/radiation therapy by Dr. Bary Castilla in 2009.  She has been doing very well.  No fevers no chills.  No new breast concerns.  No masses no weight loss no lymphadenopathy.  She did have a routine mammogram that have personally reviewed showing no evidence of new concerning lesions He is otherwise doing very well and has no new medical issues. He does self examinations every month  Past Medical History:  Diagnosis Date  . Allergy   . Breast cancer ([place]) 2009   left /chemo/rad  . Cancer ([place]) 2009   left breast, T1, N0, M0. ER/PR positive, HER2 neu positive  . Dental bridge present    permanant - upper  . GERD (gastroesophageal reflux disease)   . Hyperlipidemia   . Personal history of chemotherapy   . Personal history of radiation therapy   . Personal history of tobacco use, presenting hazards to health   . SVT (supraventricular tachycardia) ([place])   . Tachycardia   . Tubular adenoma of colon     Past Surgical History:  Procedure Laterality Date  . BREAST BIOPSY Left 2009   positive  . BREAST CYST ASPIRATION Bilateral 2005   neg  . BREAST LUMPECTOMY Left 2009   positive  . BREAST SURGERY Left 2009   lumpectomy  . CATARACT EXTRACTION W/ INTRAOCULAR LENS IMPLANT Bilateral   . COLONOSCOPY  [id number]   Dr. Candace Cruise  . COLONOSCOPY N/A 09/07/2015   Procedure: COLONOSCOPY;  Surgeon: Hulen Luster, MD;  Location: [facility];  Service: Gastroenterology;  Laterality: N/A;  . COLONOSCOPY WITH PROPOFOL N/A 06/15/2019   Procedure: COLONOSCOPY WITH PROPOFOL;  Surgeon: Toledo, Benay Pike, MD;  Location: [facility] END[facility]COPY;  Service: Endoscopy;  Laterality: N/A;  . POLYPECTOMY  09/07/2015    Procedure: POLYPECTOMY;  Surgeon: Hulen Luster, MD;  Location: [facility];  Service: Gastroenterology;;  . PORT-A-CATH REMOVAL  2011  . PORTACATH PLACEMENT  2009  . TONSILLECTOMY    . TONSILLECTOMY      Family History  Problem Relation Age of Onset  . Stroke Mother   . Heart disease Father   . Diabetes Brother   . Heart disease Paternal Grandmother   . Cancer Neg Hx   . COPD Neg Hx   . Hypertension Neg Hx   . Breast cancer Neg Hx     Social History:  reports that she quit smoking about 35 years ago. Her smoking use included cigarettes. She quit after 20.00 years of use. She has never used smokeless tobacco. She reports current alcohol use. She reports that she does not use drugs.  Allergies:  Allergies  Allergen Reactions  . Sulfa Antibiotics Nausea Only and Other (See Comments)    Upset stomach    Medications reviewed.    ROS Full ROS performed and is otherwise negative other than what is stated in HPI   BP 132/76   Pulse 73   Temp 97.7 F (36.5 C) (Oral)   Ht '5\' 5"'  (1.651 m)   Wt 164 lb (74.4 kg)   SpO2 97%   BMI 27.29 kg/m   Physical Exam Physical Exam Vitals and nursing note  reviewed. Exam conducted with a chaperone present.  Constitutional:      General: She is not in acute distress.    Appearance: Normal appearance. She is normal weight.  Eyes:     General: No scleral icterus.       Right eye: No discharge.        Left eye: No discharge.  Cardiovascular:     Rate and Rhythm: Normal rate and regular rhythm.     Heart sounds: No murmur.  Pulmonary:     Effort: Pulmonary effort is normal. No respiratory distress.     Breath sounds: Normal breath sounds.     Comments: BREAST: Left lumpectomy scar and sentinel lymph node biopsy scar.  No evidence of new concerning lesions on either breast.  No evidence of lymphadenopathy. Abdominal:     General: Abdomen is flat. There is no distension.     Palpations: Abdomen is soft. There is no mass.      Tenderness: There is no abdominal tenderness. There is no guarding or rebound.     Hernia: No hernia is present.  Musculoskeletal:     Cervical back: Normal range of motion and neck supple. No rigidity or tenderness.  Skin:    General: Skin is warm and dry.     Capillary Refill: Capillary refill takes less than 2 seconds.  Neurological:     General: No focal deficit present.     Mental Status: She is alert and oriented to person, place, and time.  Psychiatric:        Mood and Affect: Mood normal.        Behavior: Behavior normal.        Thought Content: Thought content normal.       Judgment: Judgment normal     Assessment/Plan: 79 year old female with history of left breast cancer now with normal mammogram and physical exam.  No need for any further testing or interventions.  We will follow her in 1 year with mammogram and physical exam  Greater than 50% of the 25 minutes  visit was spent in counseling/coordination of care   Caroleen Hamman, MD [facility]

## 2020-07-15 ENCOUNTER — Ambulatory Visit: Payer: Medicare PPO | Admitting: Family Medicine

## 2020-07-19 ENCOUNTER — Ambulatory Visit: Payer: Medicare PPO | Admitting: Family Medicine

## 2020-08-04 ENCOUNTER — Encounter: Payer: Self-pay | Admitting: Family Medicine

## 2020-08-04 ENCOUNTER — Other Ambulatory Visit: Payer: Self-pay

## 2020-08-04 ENCOUNTER — Ambulatory Visit (INDEPENDENT_AMBULATORY_CARE_PROVIDER_SITE_OTHER): Payer: Medicare PPO | Admitting: Family Medicine

## 2020-08-04 VITALS — BP 130/80 | HR 82 | Temp 98.1°F | Resp 16 | Ht 65.0 in | Wt 165.9 lb

## 2020-08-04 DIAGNOSIS — Z780 Asymptomatic menopausal state: Secondary | ICD-10-CM | POA: Diagnosis not present

## 2020-08-04 DIAGNOSIS — M810 Age-related osteoporosis without current pathological fracture: Secondary | ICD-10-CM | POA: Diagnosis not present

## 2020-08-04 DIAGNOSIS — E785 Hyperlipidemia, unspecified: Secondary | ICD-10-CM

## 2020-08-04 DIAGNOSIS — I471 Supraventricular tachycardia: Secondary | ICD-10-CM

## 2020-08-04 DIAGNOSIS — I10 Essential (primary) hypertension: Secondary | ICD-10-CM | POA: Diagnosis not present

## 2020-08-04 MED ORDER — METOPROLOL SUCCINATE ER 50 MG PO TB24: 50.0000 mg | ORAL_TABLET | Freq: Every day | ORAL | 3 refills | Status: DC

## 2020-08-04 NOTE — Progress Notes (Signed)
Name: Toni Callahan   MRN: [id number]    DOB: 1941-02-10   Date:08/04/2020       Progress Note  Chief Complaint  Patient presents with  . Referral    Bone Density     Subjective:   Toni Callahan is a 79 y.o. female, presents to clinic for routine f/up and with questions about bone density  Dexa previously ordered per pt preference at [facility] out pt imaging - ordered in June as well Mammogram was completed as ordered by Dr. Merlene Pulling  At her physical in August this year, discussed her osteoporosis hx: Osteoporosis: On her second round of fosamax Does her yard work and housework, no dysphagia, no jaw/dental issues Moving all the time- a lot of physical work daily  06/16/2020 - scheduled mammogram at [facility] Maintenance  Topic Date Due  . DEXA SCAN  06/11/2020  . COVID-19 Vaccine (4 - Booster for Moderna series) 12/16/2020  . MAMMOGRAM  06/16/2021  . COLONOSCOPY (Pts 45-90yrs Insurance coverage will need to be confirmed)  06/14/2022  . TETANUS/TDAP  08/26/2023  . INFLUENZA VACCINE  Completed  . PNA vac Low Risk Adult  Completed   Hypertension:  Currently managed on metoprolol only Pt reports good med compliance and denies any SE.   Blood pressure today is well controlled. BP Readings from Last 3 Encounters:  08/04/20 130/80  07/04/20 132/76  03/24/20 118/82   Pt denies CP, SOB, exertional sx, LE edema, palpitation, Ha's, visual disturbances, lightheadedness, hypotension, syncope. Dietary efforts for BP?  Healthy    Hyperlipidemia: Currently treated with lipitor 10 mg 3x a week is all she can tolerate, pt reports good med compliance Last Lipids: Lab Results  Component Value Date   CHOL 159 01/15/2020   HDL 39 (L) 01/15/2020   LDLCALC 98 01/15/2020   TRIG 127 01/15/2020   CHOLHDL 4.1 01/15/2020   - Denies: Chest pain, shortness of breath, myalgias, claudication      Current Outpatient Medications:  .  Apoaequorin (PREVAGEN PO), Take by  mouth., Disp: , Rfl:  .  aspirin 81 MG tablet, Take 81 mg by mouth daily., Disp: , Rfl:  .  atorvastatin (LIPITOR) 10 MG tablet, One by mouth at bedtime three nights a week, Disp: 36 tablet, Rfl: 3 .  Biotin 5000 MCG CAPS, Take 1 capsule by mouth daily., Disp: , Rfl:  .  Calcium Carbonate (CALCIUM 600 PO), Take 1 tablet by mouth every other day. , Disp: , Rfl:  .  Cholecalciferol (VITAMIN D PO), Take 1,000 Int'l Units by mouth daily., Disp: , Rfl:  .  Cyanocobalamin (VITAMIN B 12 PO), Take 500 mg by mouth daily., Disp: , Rfl:  .  folic acid (FOLVITE) 400 MCG tablet, Take 400 mcg by mouth daily., Disp: , Rfl:  .  Magnesium 250 MG TABS, Take 1 tablet by mouth daily., Disp: , Rfl:  .  metoprolol succinate (TOPROL-XL) 50 MG 24 hr tablet, Take 1 tablet (50 mg total) by mouth daily., Disp: 30 tablet, Rfl: 11 .  Multiple Vitamin (MULTIVITAMIN) tablet, Take 1 tablet by mouth daily., Disp: , Rfl:  .  Omega-3 Fatty Acids (FISH OIL) 1000 MG CAPS, Take 1 capsule by mouth every other day. , Disp: , Rfl:  .  Potassium Gluconate 595 MG CAPS, Take by mouth daily., Disp: , Rfl:  .  vitamin C (ASCORBIC ACID) 500 MG tablet, Take 500 mg by mouth every other day. , Disp: , Rfl:  .  zinc gluconate 50 MG tablet, Take 50 mg by mouth daily., Disp: , Rfl:   Patient Active Problem List   Diagnosis Date Noted  . Localized osteoporosis without current pathological fracture 06/18/2018  . Medicare annual wellness visit, subsequent 09/11/2017  . Colon cancer screening 09/11/2017  . Hypertriglyceridemia 09/10/2016  . 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 Counseling regarding end of life decision making 09/18/2015  . Preventative health care 09/18/2015  . Medication monitoring encounter 09/18/2015  . Hyperglycemia 09/18/2015  . Postmenopausal 09/16/2015  . Screening for osteoporosis 09/16/2015  . Loose stools 07/26/2015  . Other fecal abnormalities 07/26/2015   . Hyperlipidemia LDL goal <100 04/28/2015  . Supraventricular tachycardia (HCC) 04/28/2015  . Abdominal pain 04/28/2015  . Benign essential HTN 09/16/2014  . TI (tricuspid incompetence) 09/16/2014  . 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 Presence of intraocular lens 09/10/2014  . Breast cancer of upper-inner quadrant of left female breast (HCC) 06/03/2014  . Breathlessness on exertion 05/17/2014    Past Surgical History:  Procedure Laterality Date  . BREAST BIOPSY Left 2009   positive  . BREAST CYST ASPIRATION Bilateral 2005   neg  . BREAST LUMPECTOMY Left 2009   positive  . BREAST SURGERY Left 2009   lumpectomy  . CATARACT EXTRACTION W/ INTRAOCULAR LENS IMPLANT Bilateral   . COLONOSCOPY  [id number]   Dr. Bluford Kaufmann  . COLONOSCOPY N/A 09/07/2015   Procedure: COLONOSCOPY;  Surgeon: Wallace Cullens, MD;  Location: [facility];  Service: Gastroenterology;  Laterality: N/A;  . COLONOSCOPY WITH PROPOFOL N/A 06/15/2019   Procedure: COLONOSCOPY WITH PROPOFOL;  Surgeon: Toledo, Boykin Nearing, MD;  Location: [facility];  Service: Endoscopy;  Laterality: N/A;  . POLYPECTOMY  09/07/2015   Procedure: POLYPECTOMY;  Surgeon: Wallace Cullens, MD;  Location: [facility];  Service: Gastroenterology;;  . PORT-A-CATH REMOVAL  2011  . PORTACATH PLACEMENT  2009  . TONSILLECTOMY    . TONSILLECTOMY      Family History  Problem Relation Age of Onset  . Stroke Mother   . Heart disease Father   . Diabetes Brother   . Heart disease Paternal Grandmother   . Cancer Neg Hx   . COPD Neg Hx   . Hypertension Neg Hx   . Breast cancer Neg Hx     Social History   Tobacco Use  . Smoking status: Former Smoker    Years: 20.00    Types: Cigarettes    Quit date: 08/06/1984    Years since quitting: 36.0  . Smokeless tobacco: Never Used  . Tobacco comment: quit in 1986 approximately  Vaping Use  . Vaping Use: Never used  Substance Use Topics  . Alcohol use: Yes    Comment: drinks rarely  . Drug use: No      Allergies  Allergen Reactions  . Sulfa Antibiotics Nausea Only and Other (See Comments)    Upset stomach    Health Maintenance  Topic Date Due  . DEXA SCAN  06/11/2020  . COVID-19 Vaccine (4 - Booster for Moderna series) 12/16/2020  . MAMMOGRAM  06/16/2021  . COLONOSCOPY (Pts 45-83yrs Insurance coverage will need to be confirmed)  06/14/2022  . TETANUS/TDAP  08/26/2023  . INFLUENZA VACCINE  Completed  . PNA vac Low Risk Adult  Completed    Chart Review Today: I personally reviewed active problem list, medication list, allergies, family history, social history, health maintenance, notes from last encounter, lab results, imaging with the patient/caregiver today.  Review of Systems  Constitutional: Negative.   HENT: Negative.   Eyes: Negative.   Respiratory: Negative.   Cardiovascular: Negative.   Gastrointestinal: Negative.   Endocrine: Negative.   Genitourinary: Negative.   Musculoskeletal: Negative.   Skin: Negative.   Allergic/Immunologic: Negative.   Neurological: Negative.   Hematological: Negative.   Psychiatric/Behavioral: Negative.   All other systems reviewed and are negative.     Objective:   Vitals:   08/04/20 1422  BP: 130/80  Pulse: 82  Resp: 16  Temp: 98.1 F (36.7 C)  TempSrc: Oral  SpO2: 97%  Weight: 165 lb 14.4 oz (75.3 kg)  Height: 5\' 5"  (1.651 m)    Body mass index is 27.61 kg/m.  Physical Exam Vitals and nursing note reviewed.  Constitutional:      General: She is not in acute distress.    Appearance: Normal appearance. She is well-developed. She is not ill-appearing, toxic-appearing or diaphoretic.     Comments: Well appearing elderly female, appears stated age  HENT:     Head: Normocephalic and atraumatic.     Nose: Nose normal.  Eyes:     General:        Right eye: No discharge.        Left eye: No discharge.     Conjunctiva/sclera: Conjunctivae normal.  Neck:     Trachea: No tracheal deviation.  Cardiovascular:      Rate and Rhythm: Normal rate and regular rhythm.     Pulses: Normal pulses.     Heart sounds: Normal heart sounds.  Pulmonary:     Effort: Pulmonary effort is normal. No respiratory distress.     Breath sounds: Normal breath sounds. No stridor.  Musculoskeletal:        General: Normal range of motion.  Skin:    General: Skin is warm and dry.     Findings: No rash.  Neurological:     Mental Status: She is alert.     Motor: No abnormal muscle tone.     Coordination: Coordination normal.     Gait: Gait normal.  Psychiatric:        Mood and Affect: Mood and affect and mood normal.        Behavior: Behavior normal.         Assessment & Plan:     ICD-10-CM   1. Postmenopausal estrogen deficiency  Z78.0    reviewed previous orders, pt given info again and how to schedule  2. Supraventricular tachycardia (HCC)  I47.1 metoprolol succinate (TOPROL-XL) 50 MG 24 hr tablet   metoprolol managing, no palpitations, sees [facility]  3. Benign essential HTN  I10 metoprolol succinate (TOPROL-XL) 50 MG 24 hr tablet   BP at goal today, well controlled  4. Age-related osteoporosis without current pathological fracture  M81.0    reviewed past Dexa, repeat dexa ordered previously   5. Hyperlipidemia LDL goal <100  E78.5    good cholesterol control, taking statin only three days a week and working on healthy diet   F/up in 6-9 months routine f/up  Delsa Grana, PA-C 08/04/20 2:37 PM

## 2020-08-04 NOTE — Patient Instructions (Signed)
Call [place] imaging center and schedule your bone density test:  Health Maintenance  Topic Date Due  . DEXA scan (bone density measurement)  06/11/2020  . COVID-19 Vaccine (4 - Booster for Moderna series) 12/16/2020  . Mammogram  06/16/2021  . Colon Cancer Screening  06/14/2022  . Tetanus Vaccine  08/26/2023  . Flu Shot  Completed  . Pneumonia vaccines  Completed   And we will talk after I see those results.

## 2020-09-01 ENCOUNTER — Other Ambulatory Visit: Payer: Self-pay

## 2020-09-01 ENCOUNTER — Ambulatory Visit
Admission: RE | Admit: 2020-09-01 | Discharge: 2020-09-01 | Disposition: A | Discharge status: Home / Self Care | Payer: Medicare PPO | Source: Ambulatory Visit | Attending: Family Medicine | Admitting: Family Medicine

## 2020-09-01 DIAGNOSIS — M810 Age-related osteoporosis without current pathological fracture: Secondary | ICD-10-CM

## 2020-09-01 DIAGNOSIS — Z780 Asymptomatic menopausal state: Secondary | ICD-10-CM | POA: Diagnosis present

## 2020-11-08 ENCOUNTER — Ambulatory Visit: Payer: Medicare PPO

## 2020-11-15 ENCOUNTER — Ambulatory Visit: Payer: Medicare PPO

## 2020-11-24 ENCOUNTER — Ambulatory Visit (INDEPENDENT_AMBULATORY_CARE_PROVIDER_SITE_OTHER): Payer: Medicare PPO

## 2020-11-24 DIAGNOSIS — Z0000 Encounter for general adult medical examination without abnormal findings: Secondary | ICD-10-CM | POA: Diagnosis not present

## 2020-11-24 NOTE — Progress Notes (Signed)
Subjective:   Toni Callahan is a 80 y.o. female who presents for Medicare Annual (Subsequent) preventive examination.  Virtual Visit via Telephone Note  I connected with  Toni Callahan on 11/24/20 at  1:30 PM EDT by telephone and verified that I am speaking with the correct person using two identifiers.  Location: Patient: home Provider: [place] Persons participating in the virtual visit: [place]   I discussed the limitations, risks, security and privacy concerns of performing an evaluation and management service by telephone and the availability of in person appointments. The patient expressed understanding and agreed to proceed.  Interactive audio and video telecommunications were attempted between this nurse and patient, however failed, due to patient having technical difficulties OR patient did not have access to video capability.  We continued and completed visit with audio only.  Some vital signs may be absent or patient reported.   Clemetine Marker, LPN    Review of Systems     Cardiac Risk Factors include: advanced age (>7mn, >>52women);dyslipidemia     Objective:    There were no vitals filed for this visit. There is no height or weight on file to calculate BMI.  Advanced Directives 11/24/2020 11/03/2019 06/15/2019 12/17/2018 09/18/2018 06/18/2018 11/06/2017  Does Patient Have a Medical Advance Directive? Yes Yes Yes No Yes Yes Yes  Type of AParamedicof AInkermanLiving will HH[place]Living will Living will - HOrtleyLiving will HMiramar BeachLiving will -  Does patient want to make changes to medical advance directive? - - - - - - -  Copy of [place]in Chart? No - copy requested No - copy requested - - No - copy requested No - copy requested -  Would patient like information on creating a medical advance directive? - - - No - Patient declined - - -     Current Medications (verified) Outpatient Encounter Medications as of 11/24/2020  Medication Sig  . Apoaequorin (PREVAGEN PO) Take by mouth.  .Marland Kitchenaspirin 81 MG tablet Take 81 mg by mouth daily.  .Marland Kitchenatorvastatin (LIPITOR) 10 MG tablet One by mouth at bedtime three nights a week  . Biotin 5000 MCG CAPS Take 1 capsule by mouth daily.  . Calcium Carbonate (CALCIUM 600 PO) Take 1 tablet by mouth every other day.   . Cholecalciferol (VITAMIN D PO) Take 1,000 Int'l Units by mouth daily.  . Cyanocobalamin (VITAMIN B 12 PO) Take 500 mg by mouth daily.  . folic acid (FOLVITE) 4193MCG tablet Take 400 mcg by mouth daily.  . Magnesium 250 MG TABS Take 1 tablet by mouth daily.  . metoprolol succinate (TOPROL-XL) 50 MG 24 hr tablet Take 1 tablet (50 mg total) by mouth daily.  . Multiple Vitamin (MULTIVITAMIN) tablet Take 1 tablet by mouth daily.  . Omega-3 Fatty Acids (FISH OIL) 1000 MG CAPS Take 1 capsule by mouth every other day.   . Potassium Gluconate 595 MG CAPS Take by mouth daily.  . vitamin C (ASCORBIC ACID) 500 MG tablet Take 500 mg by mouth every other day.   . zinc gluconate 50 MG tablet Take 50 mg by mouth daily.   No facility-administered encounter medications on file as of 11/24/2020.    Allergies (verified) Sulfa antibiotics   History: Past Medical History:  Diagnosis Date  . Allergy   . Breast cancer [place]   left /chemo/rad  . Cancer ([facility] 2009   left  breast, T1, N0, M0. ER/PR positive, HER2 neu positive  . Dental bridge present    permanant - upper  . GERD (gastroesophageal reflux disease)   . Hyperlipidemia   . Personal history of chemotherapy   . Personal history of radiation therapy   . Personal history of tobacco use, presenting hazards to health   . SVT (supraventricular tachycardia) ([place])   . Tachycardia   . Tubular adenoma of colon    Past Surgical History:  Procedure Laterality Date  . BREAST BIOPSY Left 2009   positive  . BREAST CYST ASPIRATION  Bilateral 2005   neg  . BREAST LUMPECTOMY Left 2009   positive  . BREAST SURGERY Left 2009   lumpectomy  . CATARACT EXTRACTION W/ INTRAOCULAR LENS IMPLANT Bilateral   . COLONOSCOPY  [id number]   Dr. Candace Cruise  . COLONOSCOPY N/A 09/07/2015   Procedure: COLONOSCOPY;  Surgeon: Hulen Luster, MD;  Location: [place];  Service: Gastroenterology;  Laterality: N/A;  . COLONOSCOPY WITH PROPOFOL N/A 06/15/2019   Procedure: COLONOSCOPY WITH PROPOFOL;  Surgeon: Toledo, Benay Pike, MD;  Location: [facility];  Service: Endoscopy;  Laterality: N/A;  . POLYPECTOMY  09/07/2015   Procedure: POLYPECTOMY;  Surgeon: Hulen Luster, MD;  Location: [place];  Service: Gastroenterology;;  . PORT-A-CATH REMOVAL  2011  . PORTACATH PLACEMENT  2009  . TONSILLECTOMY    . TONSILLECTOMY     Family History  Problem Relation Age of Onset  . Stroke Mother   . Heart disease Father   . Diabetes Brother   . Heart disease Paternal Grandmother   . Cancer Neg Hx   . COPD Neg Hx   . Hypertension Neg Hx   . Breast cancer Neg Hx    Social History   Socioeconomic History  . Marital status: Married    Spouse name: Not on file  . Number of children: 0  . Years of education: Not on file  . Highest education level: Bachelor's degree (e.g., BA, AB, BS)  Occupational History  . Not on file  Tobacco Use  . Smoking status: Former Smoker    Years: 20.00    Types: Cigarettes    Quit date: 08/06/1984    Years since quitting: 36.3  . Smokeless tobacco: Never Used  . Tobacco comment: quit in 1986 approximately  Vaping Use  . Vaping Use: Never used  Substance and Sexual Activity  . Alcohol use: Yes    Comment: drinks rarely  . Drug use: No  . Sexual activity: Yes  Other Topics Concern  . Not on file  Social History Narrative  . Not on file   Social Determinants of Health   Financial Resource Strain: Low Risk   . Difficulty of Paying Living Expenses: Not hard at all  Food Insecurity: No Food Insecurity  .  Worried About Charity fundraiser in the Last Year: Never true  . Ran Out of Food in the Last Year: Never true  Transportation Needs: No Transportation Needs  . Lack of Transportation (Medical): No  . Lack of Transportation (Non-Medical): No  Physical Activity: Sufficiently Active  . Days of Exercise per Week: 5 days  . Minutes of Exercise per Session: 30 min  Stress: No Stress Concern Present  . Feeling of Stress : Not at all  Social Connections: Moderately Integrated  . Frequency of Communication with Friends and Family: More than three times a week  . Frequency of Social Gatherings with Friends and Family: Three  times a week  . Attends Religious Services: More than 4 times per year  . Active Member of Clubs or Organizations: No  . Attends Archivist Meetings: Never  . Marital Status: Married    Tobacco Counseling Counseling given: Not Answered Comment: quit in 1986 approximately   Clinical Intake:  Pre-visit preparation completed: Yes  Pain : No/denies pain     Nutritional Risks: None Diabetes: No  How often do you need to have someone help you when you read instructions, pamphlets, or other written materials from your doctor or pharmacy?: 1 - Never    Interpreter Needed?: No  Information entered by :: Clemetine Marker LPN   Activities of Daily Living In your present state of health, do you have any difficulty performing the following activities: 11/24/2020 08/04/2020  Hearing? N N  Comment declines hearing aids -  Vision? N N  Difficulty concentrating or making decisions? Y N  Walking or climbing stairs? N N  Dressing or bathing? N N  Doing errands, shopping? N N  Preparing Food and eating ? N -  Using the Toilet? N -  In the past six months, have you accidently leaked urine? N -  Do you have problems with loss of bowel control? N -  Managing your Medications? N -  Managing your Finances? N -  Housekeeping or managing your Housekeeping? N -  Some  recent data might be hidden    Patient Care Team: Delsa Grana, PA-C as PCP - General (Family Medicine) Corey Skains, MD as Consulting Physician (Cardiology) Lequita Asal, MD as Referring Physician (Hematology and Oncology) Efrain Sella, MD as Consulting Physician (Gastroenterology)  Indicate any recent Medical Services you may have received from other than [facility] providers in the past year (date may be approximate).     Assessment:   This is a routine wellness examination for [place].  Hearing/Vision screen  Hearing Screening   '125Hz'  '250Hz'  '500Hz'  '1000Hz'  '2000Hz'  '3000Hz'  '4000Hz'  '6000Hz'  '8000Hz'   Right ear:           Left ear:           Comments:  Pt has no difficulty hearing  Vision Screening Comments: Annual vision screenings by Dr. Atilano Median in [place] issues and exercise activities discussed: Current Exercise Habits: Home exercise routine, Type of exercise: walking, Time (Minutes): 30, Frequency (Times/Week): 5, Weekly Exercise (Minutes/Week): 150, Intensity: Mild, Exercise limited by: None identified  Goals    .  DIET - EAT MORE FRUITS AND VEGETABLES (pt-stated)      Five a day      Depression Screen PHQ 2/9 Scores 11/24/2020 08/04/2020 03/24/2020 01/15/2020 11/03/2019 02/23/2019 09/18/2018  PHQ - 2 Score 0 0 0 0 0 0 0  PHQ- 9 Score - - 0 0 - 0 0    Fall Risk Fall Risk  11/24/2020 08/04/2020 03/24/2020 01/15/2020 11/03/2019  Falls in the past year? 0 0 0 0 0  Number falls in past yr: 0 0 0 0 0  Injury with Fall? 0 0 0 0 0  Risk for fall due to : No Fall Risks - - - No Fall Risks  Follow up Falls prevention discussed Falls evaluation completed - Falls evaluation completed Falls prevention discussed    FALL RISK PREVENTION PERTAINING TO THE HOME:  Any stairs in or around the home? Yes  If so, are there any without handrails? No  Home free of loose throw rugs in walkways, pet beds, electrical cords,  etc? Yes  Adequate lighting in your home to reduce risk of  falls? Yes   ASSISTIVE DEVICES UTILIZED TO PREVENT FALLS:  Life alert? No  Use of a cane, walker or w/c? No  Grab bars in the bathroom? Yes  Shower chair or bench in shower? No  Elevated toilet seat or a handicapped toilet? No   TIMED UP AND GO:  Was the test performed? No . Telephonic visit.   Cognitive Function:     6CIT Screen 11/24/2020 11/03/2019 09/18/2018 09/11/2017  What Year? 0 points 0 points 0 points 0 points  What month? 0 points 0 points 0 points 0 points  What time? 0 points 0 points 0 points 0 points  Count back from 20 0 points 0 points 0 points 0 points  Months in reverse 2 points 0 points 0 points 4 points  Repeat phrase 6 points 0 points 0 points 0 points  Total Score 8 0 0 4    Immunizations Immunization History  Administered Date(s) Administered  . Fluad Quad(high Dose 65+) 04/24/2019  . Influenza,inj,quad, With Preservative 05/02/2018  . Influenza-Unspecified 04/09/2015, 04/11/2016, 05/02/2018, 04/19/2020  . Moderna Sars-Covid-2 Vaccination 08/18/2019, 09/15/2019, 06/18/2020  . Pneumococcal Conjugate-13 03/02/2014  . Pneumococcal Polysaccharide-23 05/01/2005  . Td 08/07/2003  . Tdap 08/25/2013  . Zoster 05/16/2006  . Zoster Recombinat (Shingrix) 06/07/2017, 09/06/2017    TDAP status: Up to date  Flu Vaccine status: Up to date  Pneumococcal vaccine status: Up to date  Covid-19 vaccine status: Completed vaccines  Qualifies for Shingles Vaccine? Yes   Zostavax completed Yes   Shingrix Completed?: Yes  Screening Tests Health Maintenance  Topic Date Due  . COVID-19 Vaccine (4 - Booster for Moderna series) 12/16/2020  . INFLUENZA VACCINE  03/06/2021  . MAMMOGRAM  06/16/2021  . COLONOSCOPY (Pts 45-58yr Insurance coverage will need to be confirmed)  06/14/2022  . DEXA SCAN  09/01/2022  . TETANUS/TDAP  08/26/2023  . PNA vac Low Risk Adult  Completed  . HPV VACCINES  Aged Out    Health Maintenance  There are no preventive care reminders to  display for this patient.  Colorectal cancer screening: Type of screening: Colonoscopy. Completed 06/15/19. Repeat every 3 years  Mammogram status: Completed 06/16/20. Repeat every year  Bone Density status: Completed 09/01/20. Results reflect: Bone density results: OSTEOPENIA. Repeat every 2 years.  Lung Cancer Screening: (Low Dose CT Chest recommended if Age 82-80years, 30 pack-year currently smoking OR have quit w/in 15years.) does not qualify.   Additional Screening:  Hepatitis C Screening: does not qualify.  Vision Screening: Recommended annual ophthalmology exams for early detection of glaucoma and other disorders of the eye. Is the patient up to date with their annual eye exam?  Yes  Who is the provider or what is the name of the office in which the patient attends annual eye exams? Dr. CAtilano Median Dental Screening: Recommended annual dental exams for proper oral hygiene  Community Resource Referral / Chronic Care Management: CRR required this visit?  No   CCM required this visit?  No      Plan:     I have personally reviewed and noted the following in the patient's chart:   . Medical and social history . Use of alcohol, tobacco or illicit drugs  . Current medications and supplements . Functional ability and status . Nutritional status . Physical activity . Advanced directives . List of other physicians . Hospitalizations, surgeries, and ER visits in previous 12 months .  Vitals . Screenings to include cognitive, depression, and falls . Referrals and appointments  In addition, I have reviewed and discussed with patient certain preventive protocols, quality metrics, and best practice recommendations. A written personalized care plan for preventive services as well as general preventive health recommendations were provided to patient.     Clemetine Marker, LPN   [id number]                     Nurse Notes: none

## 2020-11-24 NOTE — Patient Instructions (Signed)
Toni Callahan , Thank you for taking time to come for your Medicare Wellness Visit. I appreciate your ongoing commitment to your health goals. Please review the following plan we discussed and let me know if I can assist you in the future.   Screening recommendations/referrals: Colonoscopy: done 06/15/19. Repeat in 2023.  Mammogram: done 06/16/20 Bone Density: done 09/01/20 Recommended yearly ophthalmology/optometry visit for glaucoma screening and checkup Recommended yearly dental visit for hygiene and checkup  Vaccinations: Influenza vaccine: done 04/19/20 Pneumococcal vaccine: done 03/02/14 Tdap vaccine: done 08/25/13 Shingles vaccine: done 06/07/17 & 09/06/17   Covid-19:done 08/18/19, 09/15/19 & 06/18/20  Advanced directives: Please bring a copy of your health care power of attorney and living will to the office at your convenience.  Conditions/risks identified: Keep up the great work!  Next appointment: Follow up in one year for your annual wellness visit    Preventive Care 65 Years and Older, Female Preventive care refers to lifestyle choices and visits with your health care provider that can promote health and wellness. What does preventive care include?  A yearly physical exam. This is also called an annual well check.  Dental exams once or twice a year.  Routine eye exams. Ask your health care provider how often you should have your eyes checked.  Personal lifestyle choices, including:  Daily care of your teeth and gums.  Regular physical activity.  Eating a healthy diet.  Avoiding tobacco and drug use.  Limiting alcohol use.  Practicing safe sex.  Taking low-dose aspirin every day.  Taking vitamin and mineral supplements as recommended by your health care provider. What happens during an annual well check? The services and screenings done by your health care provider during your annual well check will depend on your age, overall health, lifestyle risk factors, and  family history of disease. Counseling  Your health care provider may ask you questions about your:  Alcohol use.  Tobacco use.  Drug use.  Emotional well-being.  Home and relationship well-being.  Sexual activity.  Eating habits.  History of falls.  Memory and ability to understand (cognition).  Work and work Statistician.  Reproductive health. Screening  You may have the following tests or measurements:  Height, weight, and BMI.  Blood pressure.  Lipid and cholesterol levels. These may be checked every 5 years, or more frequently if you are over 60 years old.  Skin check.  Lung cancer screening. You may have this screening every year starting at age 23 if you have a 30-pack-year history of smoking and currently smoke or have quit within the past 15 years.  Fecal occult blood test (FOBT) of the stool. You may have this test every year starting at age 74.  Flexible sigmoidoscopy or colonoscopy. You may have a sigmoidoscopy every 5 years or a colonoscopy every 10 years starting at age 73.  Hepatitis C blood test.  Hepatitis B blood test.  Sexually transmitted disease (STD) testing.  Diabetes screening. This is done by checking your blood sugar (glucose) after you have not eaten for a while (fasting). You may have this done every 1-3 years.  Bone density scan. This is done to screen for osteoporosis. You may have this done starting at age 54.  Mammogram. This may be done every 1-2 years. Talk to your health care provider about how often you should have regular mammograms. Talk with your health care provider about your test results, treatment options, and if necessary, the need for more tests. Vaccines  Your health  care provider may recommend certain vaccines, such as:  Influenza vaccine. This is recommended every year.  Tetanus, diphtheria, and acellular pertussis (Tdap, Td) vaccine. You may need a Td booster every 10 years.  Zoster vaccine. You may need this  after age 22.  Pneumococcal 13-valent conjugate (PCV13) vaccine. One dose is recommended after age 52.  Pneumococcal polysaccharide (PPSV23) vaccine. One dose is recommended after age 9. Talk to your health care provider about which screenings and vaccines you need and how often you need them. This information is not intended to replace advice given to you by your health care provider. Make sure you discuss any questions you have with your health care provider. Document Released: 08/19/2015 Document Revised: 04/11/2016 Document Reviewed: 05/24/2015 Elsevier Interactive Patient Education  2017 [place] Prevention in the Home Falls can cause injuries. They can happen to people of all ages. There are many things you can do to make your home safe and to help prevent falls. What can I do on the outside of my home?  Regularly fix the edges of walkways and driveways and fix any cracks.  Remove anything that might make you trip as you walk through a door, such as a raised step or threshold.  Trim any bushes or trees on the path to your home.  Use bright outdoor lighting.  Clear any walking paths of anything that might make someone trip, such as rocks or tools.  Regularly check to see if handrails are loose or broken. Make sure that both sides of any steps have handrails.  Any raised decks and porches should have guardrails on the edges.  Have any leaves, snow, or ice cleared regularly.  Use sand or salt on walking paths during winter.  Clean up any spills in your garage right away. This includes oil or grease spills. What can I do in the bathroom?  Use night lights.  Install grab bars by the toilet and in the tub and shower. Do not use towel bars as grab bars.  Use non-skid mats or decals in the tub or shower.  If you need to sit down in the shower, use a plastic, non-slip stool.  Keep the floor dry. Clean up any water that spills on the floor as soon as it  happens.  Remove soap buildup in the tub or shower regularly.  Attach bath mats securely with double-sided non-slip rug tape.  Do not have throw rugs and other things on the floor that can make you trip. What can I do in the bedroom?  Use night lights.  Make sure that you have a light by your bed that is easy to reach.  Do not use any sheets or blankets that are too big for your bed. They should not hang down onto the floor.  Have a firm chair that has side arms. You can use this for support while you get dressed.  Do not have throw rugs and other things on the floor that can make you trip. What can I do in the kitchen?  Clean up any spills right away.  Avoid walking on wet floors.  Keep items that you use a lot in easy-to-reach places.  If you need to reach something above you, use a strong step stool that has a grab bar.  Keep electrical cords out of the way.  Do not use floor polish or wax that makes floors slippery. If you must use wax, use non-skid floor wax.  Do not have  throw rugs and other things on the floor that can make you trip. What can I do with my stairs?  Do not leave any items on the stairs.  Make sure that there are handrails on both sides of the stairs and use them. Fix handrails that are broken or loose. Make sure that handrails are as long as the stairways.  Check any carpeting to make sure that it is firmly attached to the stairs. Fix any carpet that is loose or worn.  Avoid having throw rugs at the top or bottom of the stairs. If you do have throw rugs, attach them to the floor with carpet tape.  Make sure that you have a light switch at the top of the stairs and the bottom of the stairs. If you do not have them, ask someone to add them for you. What else can I do to help prevent falls?  Wear shoes that:  Do not have high heels.  Have rubber bottoms.  Are comfortable and fit you well.  Are closed at the toe. Do not wear sandals.  If you  use a stepladder:  Make sure that it is fully opened. Do not climb a closed stepladder.  Make sure that both sides of the stepladder are locked into place.  Ask someone to hold it for you, if possible.  Clearly mark and make sure that you can see:  Any grab bars or handrails.  First and last steps.  Where the edge of each step is.  Use tools that help you move around (mobility aids) if they are needed. These include:  Canes.  Walkers.  Scooters.  Crutches.  Turn on the lights when you go into a dark area. Replace any light bulbs as soon as they burn out.  Set up your furniture so you have a clear path. Avoid moving your furniture around.  If any of your floors are uneven, fix them.  If there are any pets around you, be aware of where they are.  Review your medicines with your doctor. Some medicines can make you feel dizzy. This can increase your chance of falling. Ask your doctor what other things that you can do to help prevent falls. This information is not intended to replace advice given to you by your health care provider. Make sure you discuss any questions you have with your health care provider. Document Released: 05/19/2009 Document Revised: 12/29/2015 Document Reviewed: 08/27/2014 Elsevier Interactive Patient Education  2017 [employer].

## 2020-12-14 ENCOUNTER — Other Ambulatory Visit: Payer: Self-pay

## 2020-12-14 DIAGNOSIS — C50212 Malignant neoplasm of upper-inner quadrant of left female breast: Secondary | ICD-10-CM

## 2020-12-15 ENCOUNTER — Other Ambulatory Visit: Payer: Self-pay

## 2020-12-15 ENCOUNTER — Inpatient Hospital Stay: Payer: Medicare PPO | Attending: Nurse Practitioner

## 2020-12-15 ENCOUNTER — Encounter: Payer: Self-pay | Admitting: Nurse Practitioner

## 2020-12-15 ENCOUNTER — Inpatient Hospital Stay: Payer: Medicare PPO | Admitting: Nurse Practitioner

## 2020-12-15 VITALS — BP 130/68 | HR 73 | Temp 98.4°F | Resp 18 | Wt 161.9 lb

## 2020-12-15 DIAGNOSIS — Z853 Personal history of malignant neoplasm of breast: Secondary | ICD-10-CM | POA: Insufficient documentation

## 2020-12-15 DIAGNOSIS — Z7982 Long term (current) use of aspirin: Secondary | ICD-10-CM | POA: Insufficient documentation

## 2020-12-15 DIAGNOSIS — Z79899 Other long term (current) drug therapy: Secondary | ICD-10-CM | POA: Insufficient documentation

## 2020-12-15 DIAGNOSIS — Z9221 Personal history of antineoplastic chemotherapy: Secondary | ICD-10-CM | POA: Insufficient documentation

## 2020-12-15 DIAGNOSIS — Z79811 Long term (current) use of aromatase inhibitors: Secondary | ICD-10-CM | POA: Diagnosis not present

## 2020-12-15 DIAGNOSIS — C50212 Malignant neoplasm of upper-inner quadrant of left female breast: Secondary | ICD-10-CM

## 2020-12-15 DIAGNOSIS — Z87891 Personal history of nicotine dependence: Secondary | ICD-10-CM | POA: Insufficient documentation

## 2020-12-15 DIAGNOSIS — Z170 Estrogen receptor positive status [ER+]: Secondary | ICD-10-CM | POA: Diagnosis not present

## 2020-12-15 LAB — CBC WITH DIFFERENTIAL/PLATELET
Abs Immature Granulocytes: 0.03 10*3/uL (ref 0.00–0.07)
Basophils Absolute: 0 10*3/uL (ref 0.0–0.1)
Basophils Relative: 1 %
Eosinophils Absolute: 0 10*3/uL (ref 0.0–0.5)
Eosinophils Relative: 0 %
HCT: 39.6 % (ref 36.0–46.0)
Hemoglobin: 13.3 g/dL (ref 12.0–15.0)
Immature Granulocytes: 1 %
Lymphocytes Relative: 34 %
Lymphs Abs: 2.2 10*3/uL (ref 0.7–4.0)
MCH: 30.7 pg (ref 26.0–34.0)
MCHC: 33.6 g/dL (ref 30.0–36.0)
MCV: 91.5 fL (ref 80.0–100.0)
Monocytes Absolute: 0.4 10*3/uL (ref 0.1–1.0)
Monocytes Relative: 6 %
Neutro Abs: 3.8 10*3/uL (ref 1.7–7.7)
Neutrophils Relative %: 58 %
Platelets: 260 10*3/uL (ref 150–400)
RBC: 4.33 MIL/uL (ref 3.87–5.11)
RDW: 11.7 % (ref 11.5–15.5)
WBC: 6.4 10*3/uL (ref 4.0–10.5)
nRBC: 0 % (ref 0.0–0.2)

## 2020-12-15 LAB — COMPREHENSIVE METABOLIC PANEL
ALT: 19 U/L (ref 0–44)
AST: 20 U/L (ref 15–41)
Albumin: 3.9 g/dL (ref 3.5–5.0)
Alkaline Phosphatase: 43 U/L (ref 38–126)
Anion gap: 6 (ref 5–15)
BUN: 13 mg/dL (ref 8–23)
CO2: 23 mmol/L (ref 22–32)
Calcium: 9 mg/dL (ref 8.9–10.3)
Chloride: 107 mmol/L (ref 98–111)
Creatinine, Ser: 0.76 mg/dL (ref 0.44–1.00)
GFR, Estimated: >60 mL/min (ref >60)
Glucose, Bld: 124 mg/dL — ABNORMAL HIGH (ref 70–99)
Potassium: 4.1 mmol/L (ref 3.5–5.1)
Sodium: 136 mmol/L (ref 135–145)
Total Bilirubin: 0.8 mg/dL (ref 0.3–1.2)
Total Protein: 7 g/dL (ref 6.5–8.1)

## 2020-12-15 NOTE — Progress Notes (Signed)
[facility]  [address], [address] [place], [place] [address] Phone: [phone]  Fax: [phone]   Clinic Day:  12/15/2020  Referring physician: Hubbard Hartshorn, FNP  Chief Complaint: stage I Her2/neu+ left breast cancer   Oncology History: Toni Callahan is a 80 y.o. female with stage I Her2/neu+ left breast cancer s/p lumpectomy and sentinel lymph node biopsy on  02/19/2008.  No pathology is available. Notes indicate pathologic stage [id number].  Tumor was ER positive, PR positive and HER-2/neu 3+.    She received 6 cycles of Taxotere, carboplatin, and Herceptin ([place])  6 cycles (03/16/2008 - 06/29/2008). She received Herceptin every 3 weeks (last cycle 02/15/2009).  She received radiation. She began letrozole (Femara).  She was off therapy for only about 2-3 months.  BCI testing on 07/06/2015 revealed a high risk of late recurrence 8.1% (CI 3.3%-12.6%) at years 5-10 with a high likelihood of benefit of extended adjuvant hormonal therapy. Femara was discontinued at the end of 12/2018.  Clinically and radiographically no evidence of recurrent disease since that time.   CA27.29 has been followed: 29.4 on 07/11/2010, 21.2 on 07/24/2011, 23.1 on 02/06/2012, 21.3 on 03/20/2013, 26.0 on 04/30/2014, 40.6 on 04/28/2015, 32.6 on 06/21/2015, 34.7 on 10/29/2016, 28.5 on 05/08/2017, 37.8 on 11/06/2017, 35.9 on 06/18/2018, and 33.5 on 12/17/2018.  Bone density on 05/19/2014 was normal with a T score of 0.3 in the left femur.  Bone density on 05/30/2016 was normal with a T-score of -0.1 in the AP spine (L1-L2).  Bone density on 06/11/2018 revealed osteoporosis with a T-score of -2.6 in the forearm radius.    She received the Moderna COVID-19 vaccine on 08/18/2019 and 09/15/2019.   HPI: Patient is 80 year old female with above history of early-stage breast cancer who completed chemotherapy, surgery, and 10 years of adjuvant hormonal therapy in 2020. She returns to clinic  for continued surveillance.  She continues to feel well and denies specific complaints.  Last mammogram in November 2021.  No interval infections.  No fevers or chills.  No dizziness or falls.  No nausea, vomiting, constipation, diarrhea.  No urinary complaints.  Has chronic forgetfulness and memory changes.  No shortness of breath or chest pain.  Appetite is stable and she denies unintentional weight loss.  She offers no further specific complaints today.  Past Medical History:  Diagnosis Date  . Allergy   . Breast cancer ([place]) 2009   left /chemo/rad  . Cancer ([place]) 2009   left breast, T1, N0, M0. ER/PR positive, HER2 neu positive  . Dental bridge present    permanant - upper  . GERD (gastroesophageal reflux disease)   . Hyperlipidemia   . Personal history of chemotherapy   . Personal history of radiation therapy   . Personal history of tobacco use, presenting hazards to health   . SVT (supraventricular tachycardia) ([place])   . Tachycardia   . Tubular adenoma of colon     Past Surgical History:  Procedure Laterality Date  . BREAST BIOPSY Left 2009   positive  . BREAST CYST ASPIRATION Bilateral 2005   neg  . BREAST LUMPECTOMY Left 2009   positive  . BREAST SURGERY Left 2009   lumpectomy  . CATARACT EXTRACTION W/ INTRAOCULAR LENS IMPLANT Bilateral   . COLONOSCOPY  [id number]   Dr. Candace Cruise  . COLONOSCOPY N/A 09/07/2015   Procedure: COLONOSCOPY;  Surgeon: Hulen Luster, MD;  Location: [facility];  Service: Gastroenterology;  Laterality: N/A;  . COLONOSCOPY WITH  PROPOFOL N/A 06/15/2019   Procedure: COLONOSCOPY WITH PROPOFOL;  Surgeon: Toledo, Benay Pike, MD;  Location: [facility];  Service: Endoscopy;  Laterality: N/A;  . POLYPECTOMY  09/07/2015   Procedure: POLYPECTOMY;  Surgeon: Hulen Luster, MD;  Location: [facility];  Service: Gastroenterology;;  . PORT-A-CATH REMOVAL  2011  . PORTACATH PLACEMENT  2009  . TONSILLECTOMY    . TONSILLECTOMY      Family History  Problem  Relation Age of Onset  . Stroke Mother   . Heart disease Father   . Diabetes Brother   . Heart disease Paternal Grandmother   . Cancer Neg Hx   . COPD Neg Hx   . Hypertension Neg Hx   . Breast cancer Neg Hx     Social History:  reports that she quit smoking about 36 years ago. Her smoking use included cigarettes. She quit after 20.00 years of use. She has never used smokeless tobacco. She reports current alcohol use. She reports that she does not use drugs. She has lived in the area 40+ years.  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 She lives in [place].  The patient is alone today.She is married.   Allergies:  Allergies  Allergen Reactions  . Sulfa Antibiotics Nausea Only and Other (See Comments)    Upset stomach    Current Medications: Current Outpatient Medications  Medication Sig Dispense Refill  . Apoaequorin (PREVAGEN PO) Take by mouth.    Marland Kitchen aspirin 81 MG tablet Take 81 mg by mouth daily.    Marland Kitchen atorvastatin (LIPITOR) 10 MG tablet One by mouth at bedtime three nights a week 36 tablet 3  . Biotin 5000 MCG CAPS Take 1 capsule by mouth daily.    . Calcium Carbonate (CALCIUM 600 PO) Take 1 tablet by mouth every other day.     . Cholecalciferol (VITAMIN D PO) Take 1,000 Int'l Units by mouth daily.    . Cyanocobalamin (VITAMIN B 12 PO) Take 500 mg by mouth daily.    . folic acid (FOLVITE) 539 MCG tablet Take 400 mcg by mouth daily.    . Magnesium 250 MG TABS Take 1 tablet by mouth daily.    . metoprolol succinate (TOPROL-XL) 50 MG 24 hr tablet Take 1 tablet (50 mg total) by mouth daily. 90 tablet 3  . Multiple Vitamin (MULTIVITAMIN) tablet Take 1 tablet by mouth daily.    . Omega-3 Fatty Acids (FISH OIL) 1000 MG CAPS Take 1 capsule by mouth every other day.     . Potassium Gluconate 595 MG CAPS Take by mouth daily.    . vitamin C (ASCORBIC ACID) 500 MG tablet Take 500 mg by mouth every other day.     . zinc  gluconate 50 MG tablet Take 50 mg by mouth daily.     No current facility-administered medications for this visit.    Review of Systems  Constitutional: Negative for chills, fever, malaise/fatigue and weight loss.  HENT: Negative for hearing loss, nosebleeds, sore throat and tinnitus.   Eyes: Negative for blurred vision and double vision.  Respiratory: Negative for cough, hemoptysis, shortness of breath and wheezing.   Cardiovascular: Negative for chest pain, palpitations and leg swelling.  Gastrointestinal: Negative for abdominal pain, blood in stool, constipation, diarrhea, melena, nausea and vomiting.  Genitourinary: Negative for dysuria and urgency.  Musculoskeletal: Negative for back pain, falls, joint pain and myalgias.  Skin: Negative for  itching and rash.  Neurological: Negative for dizziness, tingling, sensory change, loss of consciousness, weakness and headaches.  Endo/Heme/Allergies: Negative for environmental allergies. Does not bruise/bleed easily.  Psychiatric/Behavioral: Positive for memory loss. Negative for depression. The patient is not nervous/anxious and does not have insomnia.    Performance status (ECOG):  0  Vitals Blood pressure 130/68, pulse 73, temperature 98.4 F (36.9 C), resp. rate 18, weight 161 lb 14.9 oz (73.5 kg), SpO2 96 %.  General: Well-developed, well-nourished, no acute distress. Eyes: Pink conjunctiva, anicteric sclera. Lungs: Clear to auscultation bilaterally.  No audible wheezing or coughing Heart: Regular rate and rhythm.  Abdomen: Soft, nontender, nondistended.  Musculoskeletal: No edema, cyanosis, or clubbing. Neuro: Alert, answering all questions appropriately.  Skin: No rashes or petechiae noted. Psych: Normal affect. Memory changes. Bilateral breasts are symmetrical, non-tender, no suspicious masses, skin, nipple, or lymphadenopathy changes noted. Left breast surgical scar well healed.    Appointment on 12/15/2020  Component Date  Value Ref Range Status  . WBC 12/15/2020 6.4  4.0 - 10.5 K/uL Final  . RBC 12/15/2020 4.33  3.87 - 5.11 MIL/uL Final  . Hemoglobin 12/15/2020 13.3  12.0 - 15.0 g/dL Final  . HCT 12/15/2020 39.6  36.0 - 46.0 % Final  . MCV 12/15/2020 91.5  80.0 - 100.0 fL Final  . MCH 12/15/2020 30.7  26.0 - 34.0 pg Final  . MCHC 12/15/2020 33.6  30.0 - 36.0 g/dL Final  . RDW 12/15/2020 11.7  11.5 - 15.5 % Final  . Platelets 12/15/2020 260  150 - 400 K/uL Final  . nRBC 12/15/2020 0.0  0.0 - 0.2 % Final  . Neutrophils Relative % 12/15/2020 58  % Final  . Neutro Abs 12/15/2020 3.8  1.7 - 7.7 K/uL Final  . Lymphocytes Relative 12/15/2020 34  % Final  . Lymphs Abs 12/15/2020 2.2  0.7 - 4.0 K/uL Final  . Monocytes Relative 12/15/2020 6  % Final  . Monocytes Absolute 12/15/2020 0.4  0.1 - 1.0 K/uL Final  . Eosinophils Relative 12/15/2020 0  % Final  . Eosinophils Absolute 12/15/2020 0.0  0.0 - 0.5 K/uL Final  . Basophils Relative 12/15/2020 1  % Final  . Basophils Absolute 12/15/2020 0.0  0.0 - 0.1 K/uL Final  . Immature Granulocytes 12/15/2020 1  % Final  . Abs Immature Granulocytes 12/15/2020 0.03  0.00 - 0.07 K/uL Final   Performed at [facility], [address], [place], [place] [address]  . Sodium 12/15/2020 136  135 - 145 mmol/L Final  . Potassium 12/15/2020 4.1  3.5 - 5.1 mmol/L Final  . Chloride 12/15/2020 107  98 - 111 mmol/L Final  . CO2 12/15/2020 23  22 - 32 mmol/L Final  . Glucose, Bld 12/15/2020 124* 70 - 99 mg/dL Final   Glucose reference range applies only to samples taken after fasting for at least 8 hours.  . BUN 12/15/2020 13  8 - 23 mg/dL Final  . Creatinine, Ser 12/15/2020 0.76  0.44 - 1.00 mg/dL Final  . Calcium 12/15/2020 9.0  8.9 - 10.3 mg/dL Final  . Total Protein 12/15/2020 7.0  6.5 - 8.1 g/dL Final  . Albumin 12/15/2020 3.9  3.5 - 5.0 g/dL Final  . AST 12/15/2020 20  15 - 41 U/L Final  . ALT 12/15/2020 19  0 - 44 U/L Final  . Alkaline Phosphatase 12/15/2020 43   38 - 126 U/L Final  . Total Bilirubin 12/15/2020 0.8  0.3 - 1.2 mg/dL Final  .  GFR, Estimated 12/15/2020 >60  >60 mL/min Final   Comment: (NOTE) Calculated using the CKD-EPI Creatinine Equation (2021)   . Anion gap 12/15/2020 6  5 - 15 Final   Performed at [facility], [address], [place], [place] [address]    Assessment:    1.   Stage I Her2/neu+ left breast cancer - s/p lumpectomy, chemotherapy, and 10 years adjuvant femara completed in 2020. We revieweed NCCN guidelines regarding surveillance which recommends annual surveillance. She could consider releasing her from oncology surveillance to follow up with her PCP but she declines and states she prefers to be followed at the [facility]. No indication for routine lab work at this time. Plan for annual mammograms but given her age can also re-evaluate risk vs benefit of continuation. At this time, she wishes to continue annual mammograms.   2.   Osteoporosis- On fosamax per pcp. PCP to manage bone density screenings, calcium and vitamin d.   3. Memory Changes- mild per patient's report. Encouraged her to consider evaluation with neurology to discuss.   Plan:  Mammogram 06/2021.  RTC in 1 year for re-evaluation with NP  I discussed the assessment and treatment plan with the patient.  The patient was provided an opportunity to ask questions and all were answered.  The patient agreed with the plan and demonstrated an understanding of the instructions.  The patient was advised to call back if the symptoms worsen or if the condition fails to improve as anticipated.  I discussed the assessment and treatment plan with the patient. The patient was provided an opportunity to ask questions and all were answered. The patient agreed with the plan and demonstrated an understanding of the instructions.   The patient was advised to call back or seek an in-person evaluation if the symptoms worsen or if the condition fails to improve  as anticipated.   I spent 25 minutes face-to-face visit time dedicated to the care of this patient on the date of this encounter to including pre-visit review of mammograms, bone density scan, face-to-face time with the patient, and post visit ordering of testing/documentation.    Beckey Rutter, DNP, AGNP-C [place] [facility] [place], [place], [place] at [facility] [phone] (clinic)

## 2020-12-15 NOTE — Patient Instructions (Signed)
If memory changes aren't continuing to improve, consider seeing a neurologist who can evaluate those further and may consider medications. From a breast cancer perspective, you're doing great and I look forward to seeing you next year. It was a pleasure meeting you today and thank you for allowing me to participate in your care. -Beckey Rutter, NP

## 2021-01-16 ENCOUNTER — Other Ambulatory Visit: Payer: Self-pay

## 2021-01-16 ENCOUNTER — Encounter: Payer: Self-pay | Admitting: Family Medicine

## 2021-01-16 ENCOUNTER — Ambulatory Visit (INDEPENDENT_AMBULATORY_CARE_PROVIDER_SITE_OTHER): Payer: Medicare PPO | Admitting: Family Medicine

## 2021-01-16 VITALS — BP 108/60 | HR 76 | Temp 98.0°F | Resp 16 | Ht 65.0 in | Wt 160.8 lb

## 2021-01-16 DIAGNOSIS — I471 Supraventricular tachycardia: Secondary | ICD-10-CM

## 2021-01-16 DIAGNOSIS — M816 Localized osteoporosis [Lequesne]: Secondary | ICD-10-CM | POA: Diagnosis not present

## 2021-01-16 DIAGNOSIS — E785 Hyperlipidemia, unspecified: Secondary | ICD-10-CM | POA: Diagnosis not present

## 2021-01-16 DIAGNOSIS — C50212 Malignant neoplasm of upper-inner quadrant of left female breast: Secondary | ICD-10-CM

## 2021-01-16 LAB — LIPID PANEL
Cholesterol: 183 mg/dL (ref <200)
HDL: 44 mg/dL — ABNORMAL LOW (ref >=50)
LDL Cholesterol (Calc): 110 mg/dL (calc) — ABNORMAL HIGH
Non-HDL Cholesterol (Calc): 139 mg/dL (calc) — ABNORMAL HIGH (ref <130)
Total CHOL/HDL Ratio: 4.2 (calc) (ref <5.0)
Triglycerides: 174 mg/dL — ABNORMAL HIGH (ref <150)

## 2021-01-16 MED ORDER — ATORVASTATIN CALCIUM 10 MG PO TABS: ORAL_TABLET | ORAL | 3 refills | Status: DC

## 2021-01-16 NOTE — Progress Notes (Signed)
Name: Toni Callahan   MRN: [id number]    DOB: 1941/02/19   Date:01/16/2021       Progress Note  Chief Complaint  Patient presents with   Hyperlipidemia     Subjective:   Toni Callahan is a 80 y.o. female, presents to clinic for f/up on HLD  Hyperlipidemia: Currently treated with lipitor 10 mg 3x a week, pt is not sure if she has been taking this way? No problems reported when taking, she will f/up with her pharmacy on dosing and recent refills Last Lipids: Lab Results  Component Value Date   CHOL 159 01/15/2020   HDL 39 (L) 01/15/2020   LDLCALC 98 01/15/2020   TRIG 127 01/15/2020   CHOLHDL 4.1 01/15/2020   - Denies: Chest pain, shortness of breath, myalgias, claudication  Osteoporosis - on supplements calcium and vit D- Dexa done Jan 2022, T-score improved compared to 06/2018 09/01/2020: ASSESSMENT: The probability of a major osteoporotic fracture is 7.4% within the next ten years.   The probability of a hip fracture is 0.8% within the next ten years. T-score left femur normal   SVT - managed on metoprolol, no recent tachycardia or palpitations, denies CP, SOB, fatigue, exertional sx BP Readings from Last 3 Encounters:  01/16/21 108/60  12/15/20 130/68  08/04/20 130/80   Pulse Readings from Last 3 Encounters:  01/16/21 76  12/15/20 73  08/04/20 82       Current Outpatient Medications:    Apoaequorin (PREVAGEN PO), Take by mouth., Disp: , Rfl:    aspirin 81 MG tablet, Take 81 mg by mouth daily., Disp: , Rfl:    atorvastatin (LIPITOR) 10 MG tablet, One by mouth at bedtime three nights a week, Disp: 36 tablet, Rfl: 3   Biotin 5000 MCG CAPS, Take 1 capsule by mouth daily., Disp: , Rfl:    Calcium Carbonate (CALCIUM 600 PO), Take 1 tablet by mouth every other day. , Disp: , Rfl:    Cholecalciferol (VITAMIN D PO), Take 1,000 Int'l Units by mouth daily., Disp: , Rfl:    Cyanocobalamin (VITAMIN B 12 PO), Take 500 mg by mouth daily., Disp: , Rfl:    folic acid  (FOLVITE) 333 MCG tablet, Take 400 mcg by mouth daily., Disp: , Rfl:    Magnesium 250 MG TABS, Take 1 tablet by mouth daily., Disp: , Rfl:    metoprolol succinate (TOPROL-XL) 50 MG 24 hr tablet, Take 1 tablet (50 mg total) by mouth daily., Disp: 90 tablet, Rfl: 3   Multiple Vitamin (MULTIVITAMIN) tablet, Take 1 tablet by mouth daily., Disp: , Rfl:    Omega-3 Fatty Acids (FISH OIL) 1000 MG CAPS, Take 1 capsule by mouth every other day. , Disp: , Rfl:    Potassium Gluconate 595 MG CAPS, Take by mouth daily., Disp: , Rfl:    vitamin C (ASCORBIC ACID) 500 MG tablet, Take 500 mg by mouth every other day. 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 Candace Cruise   COLONOSCOPY N/A 09/07/2015   Procedure: COLONOSCOPY;  Surgeon: Hulen Luster, MD;  Location: [facility];  Service: Gastroenterology;  Laterality: N/A;   COLONOSCOPY WITH PROPOFOL N/A 06/15/2019   Procedure: COLONOSCOPY WITH PROPOFOL;  Surgeon: Toledo, Benay Pike, MD;  Location: [facility];  Service: Endoscopy;  Laterality: N/A;   POLYPECTOMY  09/07/2015   Procedure: POLYPECTOMY;  Surgeon: Hulen Luster, MD;  Location: [facility];  Service: Gastroenterology;;   [facility] REMOVAL  2011   PORTACATH PLACEMENT  2009    TONSILLECTOMY     TONSILLECTOMY      Family History  Problem Relation Age of Onset   Stroke Mother    Heart disease Father    Diabetes Brother    Heart disease Paternal Grandmother    Cancer Neg Hx    COPD Neg Hx    Hypertension Neg Hx    Breast cancer Neg Hx     Social History   Tobacco Use   Smoking status: Former    Years: 20.00    Pack years: 0.00    Types: Cigarettes    Quit date: 08/06/1984    Years since quitting: 36.4   Smokeless tobacco: Never   Tobacco comments:    quit in 1986 approximately  Vaping Use   Vaping Use: Never used  Substance Use Topics   Alcohol use: Yes    Comment: drinks rarely   Drug use: No     Allergies  Allergen Reactions   Sulfa Antibiotics Nausea Only and Other (See Comments)    Upset stomach    Health Maintenance  Topic Date Due   COVID-19 Vaccine (4 - Booster for Moderna series) 09/18/2020   INFLUENZA VACCINE  03/06/2021   MAMMOGRAM  06/16/2021   COLONOSCOPY (Pts 45-27yrs Insurance coverage will need to be confirmed)  06/14/2022   DEXA SCAN  09/01/2022   TETANUS/TDAP  08/26/2023   PNA vac Low Risk Adult  Completed   Zoster Vaccines- Shingrix  Completed   HPV VACCINES  Aged Out    Chart Review Today: I personally reviewed active problem list, medication list, allergies, family history, social history, health maintenance, notes from last encounter, lab results, imaging with the patient/caregiver today. Personally reviewed imaging (dexa) OV and labs as noted in HPI and A&P  Review of Systems  Constitutional: Negative.   HENT: Negative.    Eyes: Negative.   Respiratory: Negative.    Cardiovascular: Negative.   Gastrointestinal: Negative.   Endocrine: Negative.   Genitourinary: Negative.   Musculoskeletal: Negative.   Skin: Negative.   Allergic/Immunologic: Negative.   Neurological: Negative.   Hematological: Negative.   Psychiatric/Behavioral: Negative.    All other systems reviewed and are negative.   Objective:    Vitals:   01/16/21 1002  BP: 108/60  Pulse: 76  Resp: 16  Temp: 98 F (36.7 C)  SpO2: 98%  Weight: 160 lb 12.8 oz (72.9 kg)  Height: 5\' 5"  (1.651 m)    Body mass index is 26.76 kg/m.  Physical Exam Vitals and nursing note reviewed.  Constitutional:      General: She is not in acute distress.    Appearance: Normal appearance. She is well-developed, well-groomed and normal weight. She is  not ill-appearing, toxic-appearing or diaphoretic.     Interventions: Face mask in place.     Comments: Well appearing elderly female appears stated age  HENT:     Head: Normocephalic and atraumatic.     Right Ear: External ear normal.     Left Ear: External ear normal.  Eyes:     General: Lids are normal. No scleral icterus.       Right eye: No discharge.        Left eye: No discharge.     Conjunctiva/sclera: Conjunctivae normal.  Neck:     Trachea: Phonation normal. No tracheal deviation.  Cardiovascular:     Rate and Rhythm: Normal rate and regular rhythm.     Pulses: Normal pulses.          Radial pulses are 2+ on the right side and 2+ on the left side.     Heart sounds: Normal heart sounds. No murmur heard.   No friction rub. No gallop.  Pulmonary:     Effort: Pulmonary effort is normal. No respiratory distress.     Breath sounds: Normal breath sounds. No stridor. No wheezing, rhonchi or rales.  Chest:     Chest wall: No tenderness.  Abdominal:     General: Bowel sounds are normal.     Palpations: Abdomen is soft.  Musculoskeletal:     Right lower leg: No edema.     Left lower leg: No edema.  Skin:    General: Skin is warm and dry.     Coloration: Skin is not jaundiced or pale.     Findings: No rash.  Neurological:     Mental Status: She is alert.     Motor: No abnormal muscle tone.     Gait: Gait normal.  Psychiatric:        Mood and Affect: Mood normal.        Speech: Speech normal.        Behavior: Behavior normal. Behavior is cooperative.        Assessment  & Plan:     ICD-10-CM   1. Hyperlipidemia LDL goal <100  E78.5 atorvastatin (LIPITOR) 10 MG tablet    Lipid panel   lipitor 3x a week, due for lipid panel, LFTs done last month and reviewed, pt not sure how she is taking med, recheck labs, refills given    2. Supraventricular tachycardia (HCC) Chronic I47.1    on metoprolol, HR and BP at goal, sx well controlled, no problems or concerns currently, continue metoprolol    3. Malignant neoplasm of upper-inner quadrant of left female breast, unspecified estrogen receptor status ([place]) Chronic C50.212    she recently did oncology f/up (may 2022) reviewed visit, labs and imaging    4. Localized osteoporosis without current pathological fracture  M81.6    last dexa reviewed, improved density to radius, femur continues to be normal, continue supplements and weight bearing activity, no meds currently indicated       Return in about 1 year (around 01/16/2022) for Routine follow-up SVT, HLD.   Delsa Grana, PA-C 01/16/21 10:22 AM

## 2021-03-07 ENCOUNTER — Telehealth: Payer: Self-pay

## 2021-03-07 NOTE — Telephone Encounter (Signed)
Copied from [place] [id number]. Topic: Medical Record Request - Other >> Mar 07, 2021  3:19 PM Erick Blinks wrote: Patient Name/DOB/MRN #: Toni Callahan / September 23, 1940 / [id number]  Requestor Name/Agency: [facility] Call Back #: (Pt called not facility)  Information Requested: Requesting PAP smear, mammography, colonscopy, bone density, and flu/tetanus records.   Pt needs records of this for a physical that she is scheduled for with an outside medical facility. Pt would like to be contacted so she can retrieve these records in person.   Best contact: [phone]   Route to [facility] for [place] clinics. For all other clinics, route to the clinic's PEC Pool.

## 2021-03-08 ENCOUNTER — Telehealth: Payer: Self-pay

## 2021-03-08 NOTE — Telephone Encounter (Signed)
Done and patient notified.

## 2021-03-08 NOTE — Telephone Encounter (Signed)
Copied from [place] [id number]. Topic: Medical Record Request - Other >> Mar 07, 2021  3:19 PM Erick Blinks wrote: Patient Name/DOB/MRN #: Toni Callahan / 08-19-1940 / [id number]  Requestor Name/Agency: [facility] [place] [facility] Call Back #: (Pt called not facility)  Information Requested: Requesting PAP smear, mammography, colonscopy, bone density, and flu/tetanus records.   Pt needs records of this for a physical that she is scheduled for with an outside medical facility. Pt would like to be contacted so she can retrieve these records in person.   Best contact: [phone]   Route to [facility] for [place] clinics. For all other clinics, route to the clinic's PEC Pool.

## 2021-06-19 ENCOUNTER — Ambulatory Visit
Admission: RE | Admit: 2021-06-19 | Discharge: 2021-06-19 | Disposition: A | Discharge status: Home / Self Care | Payer: Medicare PPO | Source: Ambulatory Visit | Attending: Nurse Practitioner | Admitting: Nurse Practitioner

## 2021-06-19 ENCOUNTER — Other Ambulatory Visit: Payer: Self-pay

## 2021-06-19 DIAGNOSIS — Z1231 Encounter for screening mammogram for malignant neoplasm of breast: Secondary | ICD-10-CM | POA: Insufficient documentation

## 2021-06-19 DIAGNOSIS — Z853 Personal history of malignant neoplasm of breast: Secondary | ICD-10-CM | POA: Diagnosis not present

## 2021-06-28 ENCOUNTER — Ambulatory Visit: Payer: Medicare PPO | Admitting: Surgery

## 2021-07-10 ENCOUNTER — Ambulatory Visit (INDEPENDENT_AMBULATORY_CARE_PROVIDER_SITE_OTHER): Payer: Medicare PPO | Admitting: Surgery

## 2021-07-10 ENCOUNTER — Other Ambulatory Visit: Payer: Self-pay

## 2021-07-10 ENCOUNTER — Encounter: Payer: Self-pay | Admitting: Surgery

## 2021-07-10 VITALS — BP 137/72 | HR 64 | Temp 98.3°F | Ht 65.0 in | Wt 154.0 lb

## 2021-07-10 DIAGNOSIS — C50212 Malignant neoplasm of upper-inner quadrant of left female breast: Secondary | ICD-10-CM | POA: Diagnosis not present

## 2021-07-10 NOTE — Patient Instructions (Signed)
Patient will be asked to return to the office in one year with a bilateral screening mammogram.  We will send you a letter about these appointments.   Continue self breast exams. Call office for any new breast issues or concerns.

## 2021-07-12 ENCOUNTER — Encounter: Payer: Self-pay | Admitting: Surgery

## 2021-07-12 NOTE — Progress Notes (Signed)
Outpatient Surgical Follow Up  07/12/2021  Toni Callahan is an 80 y.o. female.   Chief Complaint  Patient presents with   Follow-up    HPI: Toni Callahan is  an 80 year old female with history of left breast cancer status post lumpectomy and sentinel lymph node biopsy and  Chemo/radiation therapy by Dr. Bary Castilla in 2009.  She has been doing very well.  No fevers no chills.  No new breast concerns.  No masses no weight loss no lymphadenopathy.  She did have a recent routine mammogram that have personally reviewed showing no evidence of new concerning lesions SHe is otherwise doing very well and has no new medical issues. SHe does self examinations every month. Completed femara  Past Medical History:  Diagnosis Date   Allergy    Breast cancer ([place]) 2009   left /chemo/rad   Cancer ([place]) 2009   left breast, T1, N0, M0. ER/PR positive, HER2 neu positive   Dental bridge present    permanant - upper   GERD (gastroesophageal reflux disease)    Hyperlipidemia    Personal history of chemotherapy    Personal history of radiation therapy    Personal history of tobacco use, presenting hazards to health    SVT (supraventricular tachycardia) ([place])    Tachycardia    Tubular adenoma of colon     Past Surgical History:  Procedure Laterality Date   BREAST BIOPSY Left 2009   positive   BREAST CYST ASPIRATION Bilateral 2005   neg   BREAST LUMPECTOMY Left 2009   positive   BREAST SURGERY Left 2009   lumpectomy   CATARACT EXTRACTION W/ INTRAOCULAR LENS IMPLANT Bilateral    COLONOSCOPY  [id number]   Dr. Candace Cruise   COLONOSCOPY N/A 09/07/2015   Procedure: COLONOSCOPY;  Surgeon: Hulen Luster, MD;  Location: [facility];  Service: Gastroenterology;  Laterality: N/A;   COLONOSCOPY WITH PROPOFOL N/A 06/15/2019   Procedure: COLONOSCOPY WITH PROPOFOL;  Surgeon: Toledo, Benay Pike, MD;  Location: [facility];  Service: Endoscopy;  Laterality: N/A;   POLYPECTOMY  09/07/2015   Procedure: POLYPECTOMY;   Surgeon: Hulen Luster, MD;  Location: [facility];  Service: Gastroenterology;;   [facility] REMOVAL  2011   PORTACATH PLACEMENT  2009   TONSILLECTOMY     TONSILLECTOMY      Family History  Problem Relation Age of Onset   Stroke Mother    Heart disease Father    Diabetes Brother    Heart disease Paternal Grandmother    Cancer Neg Hx    COPD Neg Hx    Hypertension Neg Hx    Breast cancer Neg Hx     Social History:  reports that she quit smoking about 36 years ago. Her smoking use included cigarettes. She has never used smokeless tobacco. She reports current alcohol use. She reports that she does not use drugs.  Allergies:  Allergies  Allergen Reactions   Sulfa Antibiotics Nausea Only and Other (See Comments)    Upset stomach    Medications reviewed.    ROS Full ROS performed and is otherwise negative other than what is stated in HPI   BP 137/72   Pulse 64   Temp 98.3 F (36.8 C)   Ht '5\' 5"'  (1.651 m)   Wt 154 lb (69.9 kg)   SpO2 99%   BMI 25.63 kg/m   Physical Exam Vitals and nursing note reviewed. Exam conducted with a chaperone present.  Constitutional:      General:  She is not in acute distress.    Appearance: Normal appearance. She is normal weight.  Eyes:     General: No scleral icterus.       Right eye: No discharge.        Left eye: No discharge.  Cardiovascular:     Rate and Rhythm: Normal rate and regular rhythm.     Heart sounds: No murmur.  Pulmonary:     Effort: Pulmonary effort is normal. No respiratory distress.     Breath sounds: Normal breath sounds.     Comments: BREAST: Left lumpectomy scar and sentinel lymph node biopsy scar.  No evidence of new concerning lesions on either breast.  No evidence of lymphadenopathy. Abdominal:     General: Abdomen is flat. There is no distension.     Palpations: Abdomen is soft. There is no mass.     Tenderness: There is no abdominal tenderness. There is no guarding or rebound.     Hernia: No  hernia is present.  Musculoskeletal:     Cervical back: Normal range of motion and neck supple. No rigidity or tenderness.  Skin:    General: Skin is warm and dry.     Capillary Refill: Capillary refill takes less than 2 seconds.  Neurological:     General: No focal deficit present.     Mental Status: She is alert and oriented to person, place, and time.  Psychiatric:        Mood and Affect: Mood normal.        Behavior: Behavior normal.        Thought Content: Thought content normal.       Judgment: Judgment normal    Assessment/Plan: 57 female with a history of left breast  cancer status postlumpectomy and radiation therapy now with no evidence of recurrence.  Normal mammogram and normal physical exam.  We will continue to follow her on a yearly basis per her request.   Greater than 50% of the 30 minutes  visit was spent in counseling/coordination of care   Caroleen Hamman, MD [employer]

## 2021-09-05 ENCOUNTER — Ambulatory Visit: Payer: Medicare PPO | Admitting: Internal Medicine

## 2021-09-07 ENCOUNTER — Ambulatory Visit: Payer: Medicare PPO | Admitting: Internal Medicine

## 2021-09-08 ENCOUNTER — Ambulatory Visit (INDEPENDENT_AMBULATORY_CARE_PROVIDER_SITE_OTHER): Payer: Medicare PPO | Admitting: Internal Medicine

## 2021-09-08 ENCOUNTER — Encounter: Payer: Self-pay | Admitting: Internal Medicine

## 2021-09-08 ENCOUNTER — Other Ambulatory Visit: Payer: Self-pay

## 2021-09-08 VITALS — BP 142/62 | HR 95 | Temp 98.0°F | Resp 16 | Ht 65.0 in | Wt 154.0 lb

## 2021-09-08 DIAGNOSIS — U071 COVID-19: Secondary | ICD-10-CM

## 2021-09-08 DIAGNOSIS — R0981 Nasal congestion: Secondary | ICD-10-CM

## 2021-09-08 DIAGNOSIS — Z853 Personal history of malignant neoplasm of breast: Secondary | ICD-10-CM

## 2021-09-08 DIAGNOSIS — R051 Acute cough: Secondary | ICD-10-CM

## 2021-09-08 DIAGNOSIS — I10 Essential (primary) hypertension: Secondary | ICD-10-CM

## 2021-09-08 DIAGNOSIS — E785 Hyperlipidemia, unspecified: Secondary | ICD-10-CM

## 2021-09-08 DIAGNOSIS — M816 Localized osteoporosis [Lequesne]: Secondary | ICD-10-CM

## 2021-09-08 DIAGNOSIS — I471 Supraventricular tachycardia: Secondary | ICD-10-CM

## 2021-09-08 MED ORDER — FLUTICASONE PROPIONATE 50 MCG/ACT NA SUSP: 2.0000 | Freq: Every day | NASAL | 6 refills | Status: DC

## 2021-09-08 MED ORDER — ATORVASTATIN CALCIUM 10 MG PO TABS: ORAL_TABLET | ORAL | 3 refills | Status: DC

## 2021-09-08 MED ORDER — BENZONATATE 100 MG PO CAPS: 100.0000 mg | ORAL_CAPSULE | Freq: Two times a day (BID) | ORAL | 0 refills | Status: DC | PRN

## 2021-09-08 MED ORDER — METOPROLOL SUCCINATE ER 50 MG PO TB24: 50.0000 mg | ORAL_TABLET | Freq: Every day | ORAL | 3 refills | Status: DC

## 2021-09-08 NOTE — Assessment & Plan Note (Signed)
Stable, reviewed last lipid panel. Continue statin and fish oil.

## 2021-09-08 NOTE — Patient Instructions (Addendum)
It was great seeing you today!  Plan discussed at today's visit: -Medications refilled today -I will call you with results of the COVID test, in the meantime can use nasal spray and cough suppressants   Follow up in: 6 months   Take care and let us know if you have any questions or concerns prior to your next visit.  Dr. Rosana Berger  Pneumococcal Conjugate Vaccine (Prevnar 20) Suspension for Injection What is this medication? PNEUMOCOCCAL VACCINE (NEU mo KOK al vak SEEN) is a vaccine. It prevents pneumococcus bacterial infections. These bacteria can cause serious infections like pneumonia, meningitis, and blood infections. This vaccine will not treat an infection and will not cause infection. This vaccine is recommended for adults 18 years and older. This medicine may be used for other purposes; ask your health care provider or pharmacist if you have questions. COMMON BRAND NAME(S): Prevnar 20 What should I tell my care team before I take this medication? They need to know if you have any of these conditions: bleeding disorder fever immune system problems an unusual or allergic reaction to pneumococcal vaccine, diphtheria toxoid, other vaccines, other medicines, foods, dyes, or preservatives pregnant or trying to get pregnant breast-feeding How should I use this medication? This vaccine is injected into a muscle. It is given by a health care provider. A copy of Vaccine Information Statements will be given before each vaccination. Be sure to read this information carefully each time. This sheet may change often. Talk to your health care provider about the use of this medicine in children. Special care may be needed. Overdosage: If you think you have taken too much of this medicine contact a poison control center or emergency room at once. NOTE: This medicine is only for you. Do not share this medicine with others. What if I miss a dose? This does not apply. This medicine is not for regular  use. What may interact with this medication? medicines for cancer chemotherapy medicines that suppress your immune function steroid medicines like prednisone or cortisone This list may not describe all possible interactions. Give your health care provider a list of all the medicines, herbs, non-prescription drugs, or dietary supplements you use. Also tell them if you smoke, drink alcohol, or use illegal drugs. Some items may interact with your medicine. What should I watch for while using this medication? Mild fever and pain should go away in 3 days or less. Report any unusual symptoms to your health care provider. What side effects may I notice from receiving this medication? Side effects that you should report to your doctor or health care professional as soon as possible: allergic reactions (skin rash, itching or hives; swelling of the face, lips, or tongue) confusion fast, irregular heartbeat fever over 102 degrees F muscle weakness seizures trouble breathing unusual bruising or bleeding Side effects that usually do not require medical attention (report to your doctor or health care professional if they continue or are bothersome): fever of 102 degrees F or less headache joint pain muscle cramps, pain pain, tender at site where injected This list may not describe all possible side effects. Call your doctor for medical advice about side effects. You may report side effects to FDA at 1-800-FDA-1088. Where should I keep my medication? This vaccine is only given by a health care provider. It will not be stored at home. NOTE: This sheet is a summary. It may not cover all possible information. If you have questions about this medicine, talk to your doctor, pharmacist,  or health care provider.  2022 Elsevier/Gold Standard (2020-04-07 00:00:00)

## 2021-09-08 NOTE — Assessment & Plan Note (Signed)
S/p lumpectomy, chemo/radiation. Last mammogram Birads-1, gets them every fall.

## 2021-09-08 NOTE — Assessment & Plan Note (Signed)
Stable, following with Cardiology. Metoprolol refilled today.

## 2021-09-08 NOTE — Assessment & Plan Note (Signed)
Stable, continue medications

## 2021-09-08 NOTE — Assessment & Plan Note (Signed)
Per DEXA 1/22. Currently on Vitamin D/calicum.

## 2021-09-08 NOTE — Progress Notes (Signed)
Established Patient Office Visit  Subjective:  Patient ID: Toni Callahan, female    DOB: 05/26/1941  Age: 81 y.o. MRN: [id number]  CC:  Chief Complaint  Patient presents with   Follow-up   Hyperlipidemia   Hypertension   Medication Refill    Bp/chol    HPI Toni Callahan presents for follow up on chronic medical conditions.   URI Compliant:  -Worst symptom: rhinorrhea and cough x 2 days -Fever: no -Cough: yes productive clear -Shortness of breath: no -Wheezing: no -Chest pain: no -Chest tightness: no -Chest congestion: no -Nasal congestion: yes -Runny nose: yes -Post nasal drip: yes -Sneezing: no -Sore throat: yes -Sinus pressure: no -Headache: no -Face pain: no -Ear pain: no  -Ear pressure: no  -Vomiting: no -Sick contacts: no -Context:  -Recurrent sinusitis: no -Relief with OTC cold/cough medications: no  -Treatments attempted: none   Hypertension/SVT: -Medications: Metoprolol 50 -Following with Cardiology, seeing every 6 months  -Patient is compliant with above medications and reports no side effects. -Checking BP at home (average): Not checking  -Denies any SOB, CP, vision changes, LE edema or symptoms of hypotension  HLD: -Medications: Lipitor 10 3x a week, fish oil  -Patient is compliant with above medications and reports no side effects.  -Last lipid panel: 6/22 Lipid Panel     Component Value Date/Time   CHOL 183 01/16/2021 1032   CHOL 162 09/29/2015 0835   TRIG 174 (H) 01/16/2021 1032   HDL 44 (L) 01/16/2021 1032   HDL 36 (L) 09/29/2015 0835   CHOLHDL 4.2 01/16/2021 1032   VLDL 42 (H) 03/11/2017 0921   LDLCALC 110 (H) 01/16/2021 1032   LABVLDL 50 (H) 09/29/2015 0835    Hx of Breast Cancer: -Left sided, 2009 -s/p chemo/radiation and lumpectomy   Osteoporosis: -Last DEXA 1/22 with T score improvement -Currently on Calcium and Vitamin D  Health Maintenance: -Mammogram: 11/22 Birads 1 -Blood work up to date   Past Medical  History:  Diagnosis Date   Allergy    Breast cancer ([place]) 2009   left /chemo/rad   Cancer ([place]) 2009   left breast, T1, N0, M0. ER/PR positive, HER2 neu positive   Dental bridge present    permanant - upper   GERD (gastroesophageal reflux disease)    Hyperlipidemia    Personal history of chemotherapy    Personal history of radiation therapy    Personal history of tobacco use, presenting hazards to health    SVT (supraventricular tachycardia) ([place])    Tachycardia    Tubular adenoma of colon     Past Surgical History:  Procedure Laterality Date   BREAST BIOPSY Left 2009   positive   BREAST CYST ASPIRATION Bilateral 2005   neg   BREAST LUMPECTOMY Left 2009   positive   BREAST SURGERY Left 2009   lumpectomy   CATARACT EXTRACTION W/ INTRAOCULAR LENS IMPLANT Bilateral    COLONOSCOPY  [id number]   Dr. Candace Cruise   COLONOSCOPY N/A 09/07/2015   Procedure: COLONOSCOPY;  Surgeon: Hulen Luster, MD;  Location: [place];  Service: Gastroenterology;  Laterality: N/A;   COLONOSCOPY WITH PROPOFOL N/A 06/15/2019   Procedure: COLONOSCOPY WITH PROPOFOL;  Surgeon: Toledo, Benay Pike, MD;  Location: [facility] ENDOSCOPY;  Service: Endoscopy;  Laterality: N/A;   POLYPECTOMY  09/07/2015   Procedure: POLYPECTOMY;  Surgeon: Hulen Luster, MD;  Location: [place];  Service: Gastroenterology;;   [facility] REMOVAL  2011   PORTACATH PLACEMENT  2009   TONSILLECTOMY  TONSILLECTOMY      Family History  Problem Relation Age of Onset   Stroke Mother    Heart disease Father    Diabetes Brother    Heart disease Paternal Grandmother    Cancer Neg Hx    COPD Neg Hx    Hypertension Neg Hx    Breast cancer Neg Hx     Social History   Socioeconomic History   Marital status: Married    Spouse name: Not on file   Number of children: 0   Years of education: Not on file   Highest education level: Bachelor's degree (e.g., BA, AB, BS)  Occupational History   Not on file  Tobacco Use   Smoking  status: Former    Years: 20.00    Types: Cigarettes    Quit date: 08/06/1984    Years since quitting: 37.1   Smokeless tobacco: Never   Tobacco comments:    quit in 1986 approximately  Vaping Use   Vaping Use: Never used  Substance and Sexual Activity   Alcohol use: Yes    Comment: drinks rarely   Drug use: No   Sexual activity: Yes  Other Topics Concern   Not on file  Social History Narrative   Not on file   Social Determinants of Health   Financial Resource Strain: Low Risk    Difficulty of Paying Living Expenses: Not hard at all  Food Insecurity: No Food Insecurity   Worried About Charity fundraiser in the Last Year: Never true   [place] in the Last Year: Never true  Transportation Needs: No Transportation Needs   Lack of Transportation (Medical): No   Lack of Transportation (Non-Medical): No  Physical Activity: Sufficiently Active   Days of Exercise per Week: 5 days   Minutes of Exercise per Session: 30 min  Stress: No Stress Concern Present   Feeling of Stress : Not at all  Social Connections: Moderately Integrated   Frequency of Communication with Friends and Family: More than three times a week   Frequency of Social Gatherings with Friends and Family: Three times a week   Attends Religious Services: More than 4 times per year   Active Member of Clubs or Organizations: No   Attends Archivist Meetings: Never   Marital Status: Married  Human resources officer Violence: Not At Risk   Fear of Current or Ex-Partner: No   Emotionally Abused: No   Physically Abused: No   Sexually Abused: No    Outpatient Medications Prior to Visit  Medication Sig Dispense Refill   Apoaequorin (PREVAGEN PO) Take by mouth.     aspirin 81 MG tablet Take 81 mg by mouth daily.     atorvastatin (LIPITOR) 10 MG tablet One by mouth at bedtime three nights a week 36 tablet 3   Biotin 5000 MCG CAPS Take 1 capsule by mouth daily.     Calcium Carbonate (CALCIUM 600 PO) Take 1  tablet by mouth every other day.      Cholecalciferol (VITAMIN D PO) Take 1,000 Int'l Units by mouth daily.     Cyanocobalamin (VITAMIN B 12 PO) Take 500 mg by mouth daily.     folic acid (FOLVITE) 462 MCG tablet Take 400 mcg by mouth daily.     Magnesium 250 MG TABS Take 1 tablet by mouth daily.     metoprolol succinate (TOPROL-XL) 50 MG 24 hr tablet Take 1 tablet (50 mg total) by mouth daily. 90 tablet  3   Multiple Vitamin (MULTIVITAMIN) tablet Take 1 tablet by mouth daily.     Omega-3 Fatty Acids (FISH OIL) 1000 MG CAPS Take 1 capsule by mouth every other day.      Potassium Gluconate 595 MG CAPS Take by mouth daily.     vitamin C (ASCORBIC ACID) 500 MG tablet Take 500 mg by mouth every other day.      zinc gluconate 50 MG tablet Take 50 mg by mouth daily.     No facility-administered medications prior to visit.    Allergies  Allergen Reactions   Sulfa Antibiotics Nausea Only and Other (See Comments)    Upset stomach    ROS Review of Systems  Constitutional:  Negative for chills and fever.  HENT:  Positive for congestion, postnasal drip, rhinorrhea and sore throat. Negative for ear discharge, ear pain, sinus pressure and sinus pain.   Respiratory:  Positive for cough. Negative for shortness of breath and wheezing.   Cardiovascular:  Negative for chest pain and palpitations.  Gastrointestinal:  Negative for abdominal pain, diarrhea, nausea and vomiting.  Neurological:  Negative for dizziness and headaches.     Objective:    Physical Exam Constitutional:      Appearance: Normal appearance.  HENT:     Head: Normocephalic and atraumatic.     Nose: Congestion present.     Mouth/Throat:     Mouth: Mucous membranes are moist.     Comments: PND present Eyes:     Conjunctiva/sclera: Conjunctivae normal.  Cardiovascular:     Rate and Rhythm: Normal rate and regular rhythm.  Pulmonary:     Effort: Pulmonary effort is normal.     Breath sounds: Normal breath sounds.   Musculoskeletal:     Right lower leg: No edema.     Left lower leg: No edema.  Skin:    General: Skin is warm and dry.  Neurological:     General: No focal deficit present.     Mental Status: She is alert. Mental status is at baseline.  Psychiatric:        Mood and Affect: Mood normal.        Behavior: Behavior normal.    BP (!) 142/62    Pulse 95    Temp 98 F (36.7 C)    Resp 16    Ht '5\' 5"'  (1.651 m)    Wt 154 lb (69.9 kg)    SpO2 97%    BMI 25.63 kg/m  Wt Readings from Last 3 Encounters:  09/08/21 154 lb (69.9 kg)  07/10/21 154 lb (69.9 kg)  01/16/21 160 lb 12.8 oz (72.9 kg)     Health Maintenance Due  Topic Date Due   COVID-19 Vaccine (4 - Booster for Moderna series) 08/13/2020    There are no preventive care reminders to display for this patient.  No results found for: TSH Lab Results  Component Value Date   WBC 6.4 12/15/2020   HGB 13.3 12/15/2020   HCT 39.6 12/15/2020   MCV 91.5 12/15/2020   PLT 260 12/15/2020   Lab Results  Component Value Date   NA 136 12/15/2020   K 4.1 12/15/2020   CO2 23 12/15/2020   GLUCOSE 124 (H) 12/15/2020   BUN 13 12/15/2020   CREATININE 0.76 12/15/2020   BILITOT 0.8 12/15/2020   ALKPHOS 43 12/15/2020   AST 20 12/15/2020   ALT 19 12/15/2020   PROT 7.0 12/15/2020   ALBUMIN 3.9 12/15/2020   CALCIUM 9.0 12/15/2020  ANIONGAP 6 12/15/2020   Lab Results  Component Value Date   CHOL 183 01/16/2021   Lab Results  Component Value Date   HDL 44 (L) 01/16/2021   Lab Results  Component Value Date   LDLCALC 110 (H) 01/16/2021   Lab Results  Component Value Date   TRIG 174 (H) 01/16/2021   Lab Results  Component Value Date   CHOLHDL 4.2 01/16/2021   Lab Results  Component Value Date   HGBA1C 5.3 03/11/2017      Assessment & Plan:   Problem List Items Addressed This Visit       Cardiovascular and Mediastinum   Benign essential HTN    Stable, continue medications.      Relevant Medications   atorvastatin  (LIPITOR) 10 MG tablet   metoprolol succinate (TOPROL-XL) 50 MG 24 hr tablet   Supraventricular tachycardia (HCC)    Stable, following with Cardiology. Metoprolol refilled today.      Relevant Medications   atorvastatin (LIPITOR) 10 MG tablet   metoprolol succinate (TOPROL-XL) 50 MG 24 hr tablet     Musculoskeletal and Integument   Localized osteoporosis without current pathological fracture    Per DEXA 1/22. Currently on Vitamin D/calicum.         Other   History of breast cancer    S/p lumpectomy, chemo/radiation. Last mammogram Birads-1, gets them every fall.       Hyperlipidemia LDL goal <100 - Primary    Stable, reviewed last lipid panel. Continue statin and fish oil.       Relevant Medications   atorvastatin (LIPITOR) 10 MG tablet   metoprolol succinate (TOPROL-XL) 50 MG 24 hr tablet   Other Visit Diagnoses     Acute cough    Test for COVID today, treat with cough suppressant and nasal steroid.    Relevant Medications   benzonatate (TESSALON) 100 MG capsule   fluticasone (FLONASE) 50 MCG/ACT nasal spray   Other Relevant Orders   Novel Coronavirus, NAA ([facility])   Nasal congestion       Relevant Medications   fluticasone (FLONASE) 50 MCG/ACT nasal spray   Other Relevant Orders   Novel Coronavirus, NAA ([facility])       Meds ordered this encounter  Medications   benzonatate (TESSALON) 100 MG capsule    Sig: Take 1 capsule (100 mg total) by mouth 2 (two) times daily as needed for cough.    Dispense:  20 capsule    Refill:  0   fluticasone (FLONASE) 50 MCG/ACT nasal spray    Sig: Place 2 sprays into both nostrils daily.    Dispense:  16 g    Refill:  6   atorvastatin (LIPITOR) 10 MG tablet    Sig: One by mouth at bedtime three nights a week    Dispense:  36 tablet    Refill:  3   metoprolol succinate (TOPROL-XL) 50 MG 24 hr tablet    Sig: Take 1 tablet (50 mg total) by mouth daily.    Dispense:  90 tablet    Refill:  3    Follow-up: Return in about 6  months (around 03/08/2022).    Teodora Medici, DO

## 2021-09-09 LAB — NOVEL CORONAVIRUS, NAA: SARS-CoV-2, NAA: DETECTED — AB

## 2021-09-09 LAB — SARS-COV-2, NAA 2 DAY TAT

## 2021-09-11 MED ORDER — MOLNUPIRAVIR EUA 200MG CAPSULE: 4.0000 | ORAL_CAPSULE | Freq: Two times a day (BID) | ORAL | 0 refills | Status: AC

## 2021-09-11 NOTE — Addendum Note (Signed)
Addended by: Teodora Medici on: 09/11/2021 09:29 AM   Modules accepted: Orders

## 2021-10-09 IMAGING — MG DIGITAL SCREENING BILAT W/ TOMO W/ CAD
8 series · 8 of 24 positions shown · non-contrast
Comparison: Previous exam(s).

CLINICAL DATA: Screening.

EXAM:
DIGITAL SCREENING BILATERAL MAMMOGRAM WITH TOMO AND CAD

[R CC synth-2D]
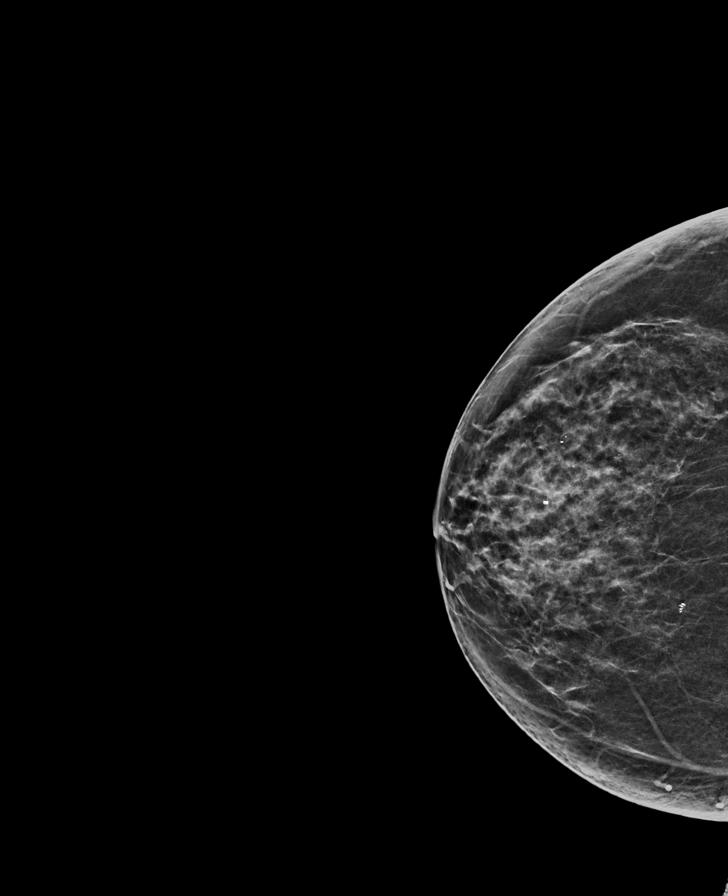

[R MLO synth-2D]
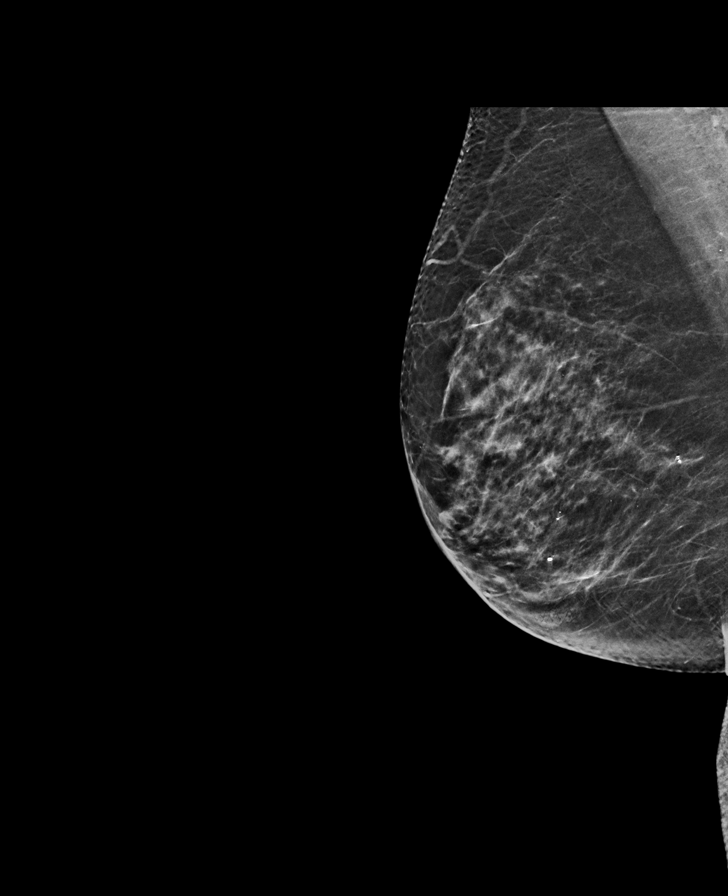

[L CC synth-2D]
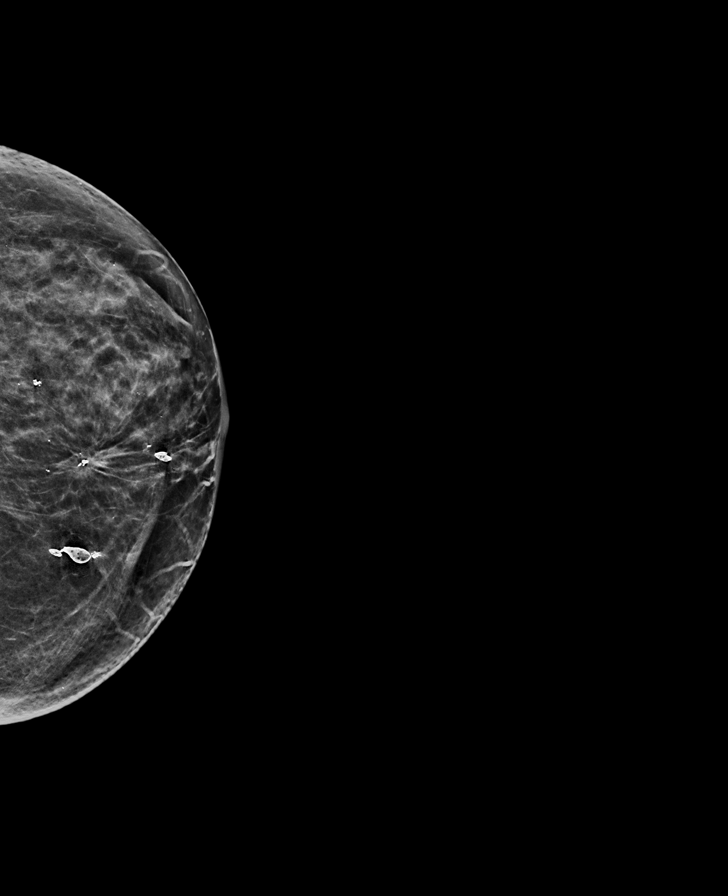

[L MLO synth-2D]
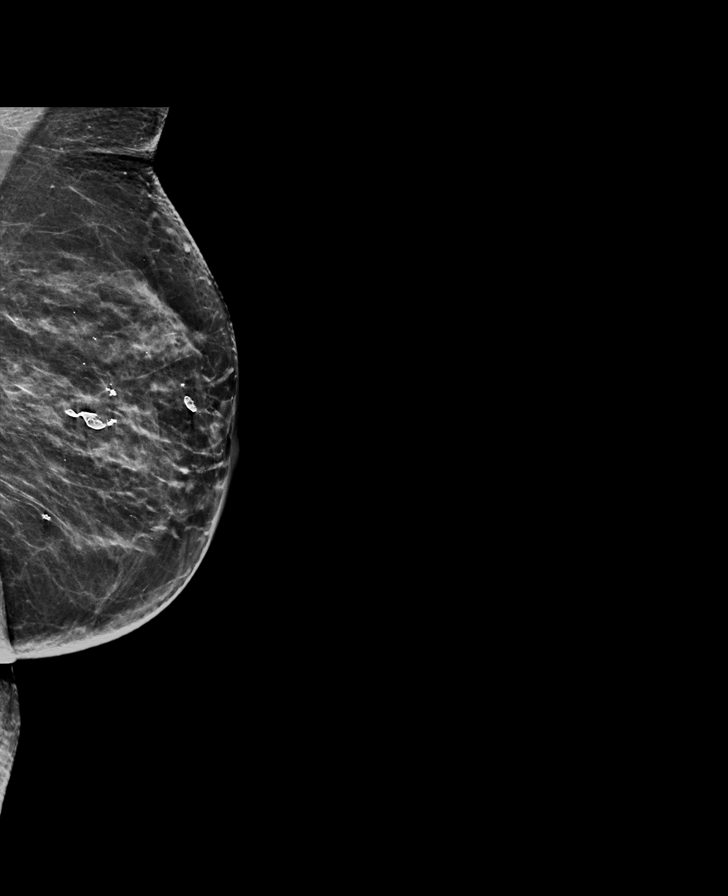

[R MLO tomo · tomo slice 33/66.0]
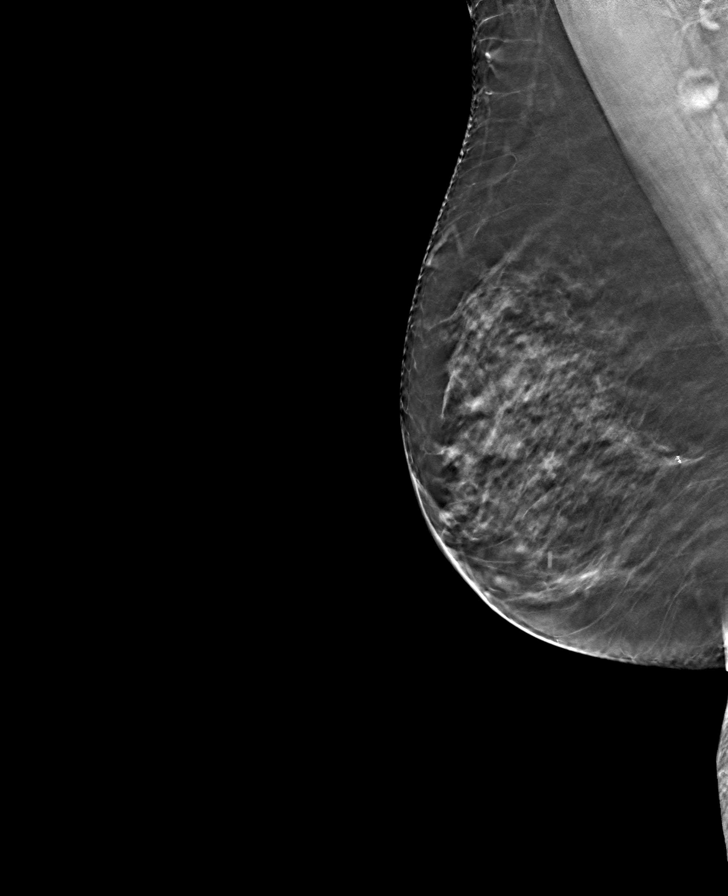

[L CC tomo · tomo slice 31/60.0]
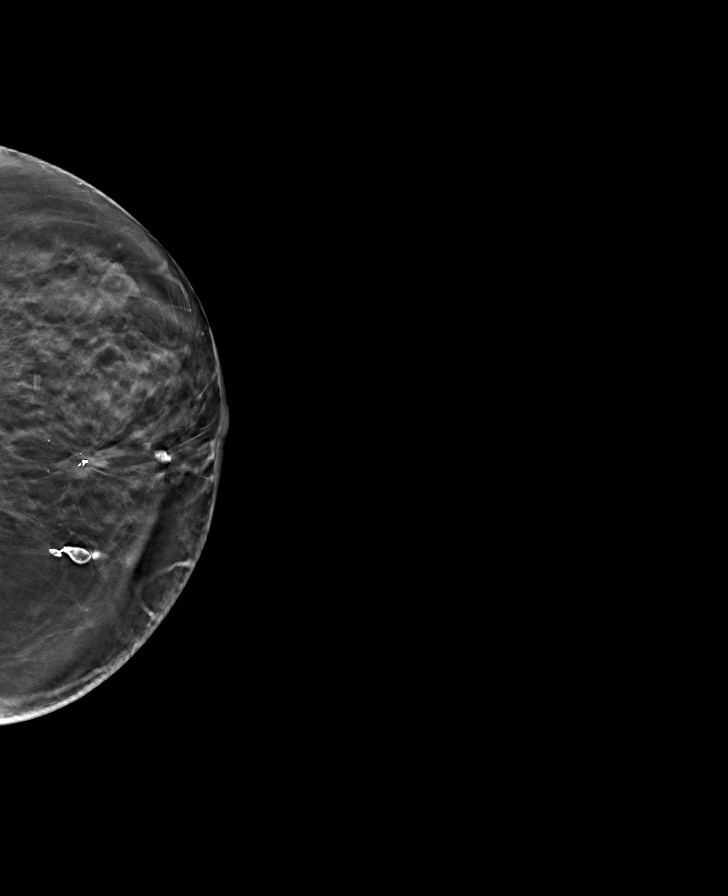

[R CC tomo · tomo slice 31/60.0]
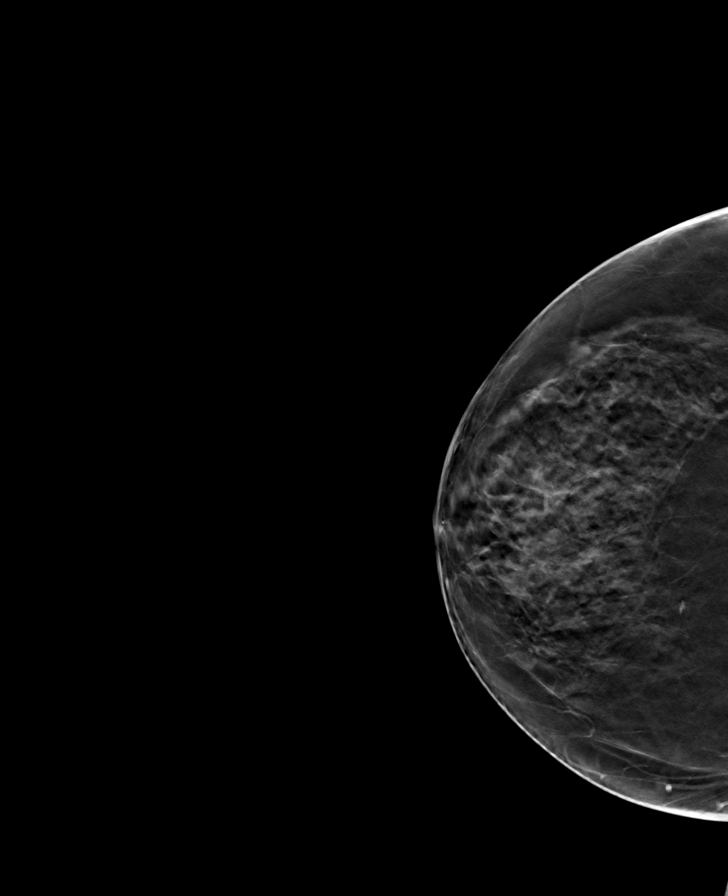

[L MLO tomo · tomo slice 35/69.0]
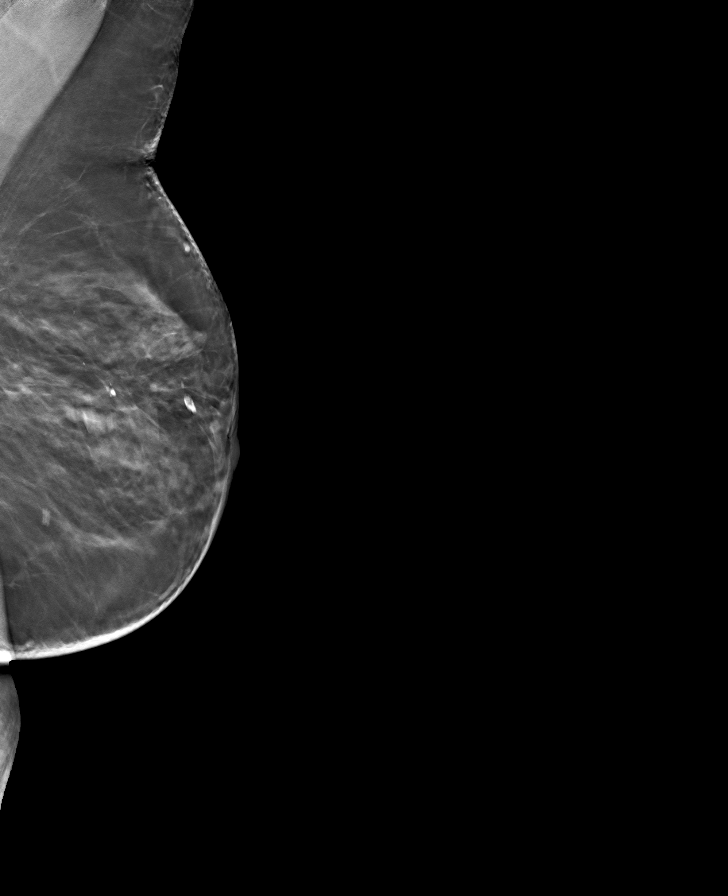

[8 of 24 positions shown; findings below may reference images not displayed]

ACR Breast Density Category c: The breast tissue is heterogeneously
dense, which may obscure small masses.
FINDINGS: There are no findings suspicious for malignancy. Images were
processed with CAD.
IMPRESSION: No mammographic evidence of malignancy. A result letter of this
screening mammogram will be mailed directly to the patient.

RECOMMENDATION:
Screening mammogram in one year. (Code:FT-U-LHB)

BI-RADS CATEGORY  1: Negative.

## 2021-11-28 ENCOUNTER — Ambulatory Visit (INDEPENDENT_AMBULATORY_CARE_PROVIDER_SITE_OTHER): Payer: Medicare PPO

## 2021-11-28 DIAGNOSIS — Z0000 Encounter for general adult medical examination without abnormal findings: Secondary | ICD-10-CM

## 2021-11-28 NOTE — Progress Notes (Signed)
? ?Subjective:  ? Toni Callahan is a 81 y.o. female who presents for Medicare Annual (Subsequent) preventive examination. ? ?Virtual Visit via Telephone Note ? ?I connected with  [name] on 11/28/21 at 11:15 AM EDT by telephone and verified that I am speaking with the correct person using two identifiers. ? ?Location: ?Patient: home ?Provider: [place] ?Persons participating in the virtual visit: patient/Nurse Health Advisor ?  ?I discussed the limitations, risks, security and privacy concerns of performing an evaluation and management service by telephone and the availability of in person appointments. The patient expressed understanding and agreed to proceed. ? ?Interactive audio and video telecommunications were attempted between this nurse and patient, however failed, due to patient having technical difficulties OR patient did not have access to video capability.  We continued and completed visit with audio only. ? ?Some vital signs may be absent or patient reported.  ? ?Clemetine Marker, LPN ? ? ?Review of Systems    ? ?Cardiac Risk Factors include: advanced age (>67mn, >>42women);dyslipidemia;hypertension ? ?   ?Objective:  ?  ?There were no vitals filed for this visit. ?There is no height or weight on file to calculate BMI. ? ? ?  11/28/2021  ? 11:20 AM 12/15/2020  ?  8:48 AM 11/24/2020  ?  1:39 PM 11/03/2019  ?  9:32 AM 06/15/2019  ?  8:59 AM 12/17/2018  ? 10:16 AM 09/18/2018  ?  8:28 AM  ?Advanced Directives  ?Does Patient Have a Medical Advance Directive? Yes Yes Yes Yes Yes No Yes  ?Type of AParamedicof ADiamondLiving will HB[place]Living will [place]villeLiving will HPonetoLiving will Living will  HM[place]Living will  ?Does patient want to make changes to medical advance directive?  No - Patient declined       ?Copy of [place] Chart? No - copy requested No - copy requested No  - copy requested No - copy requested   No - copy requested  ?Would patient like information on creating a medical advance directive?      No - Patient declined   ? ? ?Current Medications (verified) ?Outpatient Encounter Medications as of 11/28/2021  ?Medication Sig  ? Apoaequorin (PREVAGEN PO) Take by mouth.  ? aspirin 81 MG tablet Take 81 mg by mouth daily.  ? atorvastatin (LIPITOR) 10 MG tablet One by mouth at bedtime three nights a week  ? Biotin 5000 MCG CAPS Take 1 capsule by mouth daily.  ? Calcium Carbonate (CALCIUM 600 PO) Take 1 tablet by mouth every other day.   ? Cholecalciferol (VITAMIN D PO) Take 1,000 Int'l Units by mouth daily.  ? Cyanocobalamin (VITAMIN B 12 PO) Take 500 mg by mouth daily.  ? fluticasone (FLONASE) 50 MCG/ACT nasal spray Place 2 sprays into both nostrils daily.  ? folic acid (FOLVITE) 4939MCG tablet Take 400 mcg by mouth daily.  ? Magnesium 250 MG TABS Take 1 tablet by mouth daily.  ? metoprolol succinate (TOPROL-XL) 50 MG 24 hr tablet Take 1 tablet (50 mg total) by mouth daily.  ? Multiple Vitamin (MULTIVITAMIN) tablet Take 1 tablet by mouth daily.  ? Omega-3 Fatty Acids (FISH OIL) 1000 MG CAPS Take 1 capsule by mouth every other day.   ? Potassium Gluconate 595 MG CAPS Take by mouth daily.  ? sertraline (ZOLOFT) 50 MG tablet Take 1 tablet by mouth daily.  ? vitamin C (ASCORBIC ACID) 500  MG tablet Take 500 mg by mouth every other day.   ? zinc gluconate 50 MG tablet Take 50 mg by mouth daily.  ? [DISCONTINUED] benzonatate (TESSALON) 100 MG capsule Take 1 capsule (100 mg total) by mouth 2 (two) times daily as needed for cough.  ? ?No facility-administered encounter medications on file as of 11/28/2021.  ? ? ?Allergies (verified) ?Sulfa antibiotics  ? ?History: ?Past Medical History:  ?Diagnosis Date  ? Allergy   ? Breast cancer ([place]) 2009  ? left /chemo/rad  ? Cancer [facility]) 2009  ? left breast, T1, N0, M0. ER/PR positive, HER2 neu positive  ? Dental bridge present   ? permanant - upper   ? GERD (gastroesophageal reflux disease)   ? Hyperlipidemia   ? Personal history of chemotherapy   ? Personal history of radiation therapy   ? Personal history of tobacco use, presenting hazards to health   ? SVT (supraventricular tachycardia) ([place])   ? Tachycardia   ? Tubular adenoma of colon   ? ?Past Surgical History:  ?Procedure Laterality Date  ? BREAST BIOPSY Left 2009  ? positive  ? BREAST CYST ASPIRATION Bilateral 2005  ? neg  ? BREAST LUMPECTOMY Left 2009  ? positive  ? BREAST SURGERY Left 2009  ? lumpectomy  ? CATARACT EXTRACTION W/ INTRAOCULAR LENS IMPLANT Bilateral   ? COLONOSCOPY  [id number]  ? Dr. Candace Cruise  ? COLONOSCOPY N/A 09/07/2015  ? Procedure: COLONOSCOPY;  Surgeon: Hulen Luster, MD;  Location: [place];  Service: Gastroenterology;  Laterality: N/A;  ? COLONOSCOPY WITH PROPOFOL N/A 06/15/2019  ? Procedure: COLONOSCOPY WITH PROPOFOL;  Surgeon: Toledo, Benay Pike, MD;  Location: [facility] ENDOSCOPY;  Service: Endoscopy;  Laterality: N/A;  ? POLYPECTOMY  09/07/2015  ? Procedure: POLYPECTOMY;  Surgeon: Hulen Luster, MD;  Location: [place];  Service: Gastroenterology;;  ? [facility] REMOVAL  2011  ? PORTACATH PLACEMENT  2009  ? TONSILLECTOMY    ? TONSILLECTOMY    ? ?Family History  ?Problem Relation Age of Onset  ? Stroke Mother   ? Heart disease Father   ? Diabetes Brother   ? Heart disease Paternal Grandmother   ? Cancer Neg Hx   ? COPD Neg Hx   ? Hypertension Neg Hx   ? Breast cancer Neg Hx   ? ?Social History  ? ?Socioeconomic History  ? Marital status: Married  ?  Spouse name: Not on file  ? Number of children: 0  ? Years of education: Not on file  ? Highest education level: Bachelor's degree (e.g., BA, AB, BS)  ?Occupational History  ? Not on file  ?Tobacco Use  ? Smoking status: Former  ?  Years: 20.00  ?  Types: Cigarettes  ?  Quit date: 08/06/1984  ?  Years since quitting: 37.3  ? Smokeless tobacco: Never  ? Tobacco comments:  ?  quit in 1986 approximately  ?Vaping Use  ? Vaping Use: Never  used  ?Substance and Sexual Activity  ? Alcohol use: Yes  ?  Comment: drinks rarely  ? Drug use: No  ? Sexual activity: Yes  ?Other Topics Concern  ? Not on file  ?Social History Narrative  ? Not on file  ? ?Social Determinants of Health  ? ?Financial Resource Strain: Low Risk   ? Difficulty of Paying Living Expenses: Not hard at all  ?Food Insecurity: No Food Insecurity  ? Worried About Charity fundraiser in the Last Year: Never true  ? Ran  Out of Food in the Last Year: Never true  ?Transportation Needs: No Transportation Needs  ? Lack of Transportation (Medical): No  ? Lack of Transportation (Non-Medical): No  ?Physical Activity: Inactive  ? Days of Exercise per Week: 0 days  ? Minutes of Exercise per Session: 0 min  ?Stress: No Stress Concern Present  ? Feeling of Stress : Not at all  ?Social Connections: Moderately Integrated  ? Frequency of Communication with Friends and Family: More than three times a week  ? Frequency of Social Gatherings with Friends and Family: Three times a week  ? Attends Religious Services: More than 4 times per year  ? Active Member of Clubs or Organizations: No  ? Attends Archivist Meetings: Never  ? Marital Status: Married  ? ? ?Tobacco Counseling ?Counseling given: Not Answered ?Tobacco comments: quit in 1986 approximately ? ? ?Clinical Intake: ? ?Pre-visit preparation completed: Yes ? ?Pain : No/denies pain ? ?  ? ?Nutritional Risks: None ?Diabetes: No ? ?How often do you need to have someone help you when you read instructions, pamphlets, or other written materials from your doctor or pharmacy?: 1 - Never ? ? ? ?Interpreter Needed?: No ? ?Information entered by :: Clemetine Marker LPN ? ? ?Activities of Daily Living ? ?  11/28/2021  ? 11:21 AM 09/08/2021  ?  1:28 PM  ?In your present state of health, do you have any difficulty performing the following activities:  ?Hearing? 0 0  ?Vision? 0 0  ?Difficulty concentrating or making decisions? 0 0  ?Walking or climbing stairs? 0 0   ?Dressing or bathing? 0 0  ?Doing errands, shopping? 0 0  ?Preparing Food and eating ? N   ?Using the Toilet? N   ?In the past six months, have you accidently leaked urine? N   ?Do you have problems with

## 2021-11-28 NOTE — Patient Instructions (Signed)
Ms. Pozo , ?Thank you for taking time to come for your Medicare Wellness Visit. I appreciate your ongoing commitment to your health goals. Please review the following plan we discussed and let me know if I can assist you in the future.  ? ?Screening recommendations/referrals: ?Colonoscopy: done 06/15/2019. Repeat 06/2022 ?Mammogram: done 06/19/21 ?Bone Density: done 09/01/20 ?Recommended yearly ophthalmology/optometry visit for glaucoma screening and checkup ?Recommended yearly dental visit for hygiene and checkup ? ?Vaccinations: ?Influenza vaccine: done 05/08/21 ?Pneumococcal vaccine: done 03/02/14 ?Tdap vaccine: done 08/25/13 ?Shingles vaccine: done 06/07/17 & 09/09/17   ?Covid-19:done 08/18/19, 09/15/19 & 06/18/20 ? ?Advanced directives: Please bring a copy of your health care power of attorney and living will to the office at your convenience.  ? ?Conditions/risks identified: Keep up the great work! ? ?Next appointment: Follow up in one year for your annual wellness visit  ? ? ?Preventive Care 24 Years and Older, Female ?Preventive care refers to lifestyle choices and visits with your health care provider that can promote health and wellness. ?What does preventive care include? ?A yearly physical exam. This is also called an annual well check. ?Dental exams once or twice a year. ?Routine eye exams. Ask your health care provider how often you should have your eyes checked. ?Personal lifestyle choices, including: ?Daily care of your teeth and gums. ?Regular physical activity. ?Eating a healthy diet. ?Avoiding tobacco and drug use. ?Limiting alcohol use. ?Practicing safe sex. ?Taking low-dose aspirin every day. ?Taking vitamin and mineral supplements as recommended by your health care provider. ?What happens during an annual well check? ?The services and screenings done by your health care provider during your annual well check will depend on your age, overall health, lifestyle risk factors, and family history of  disease. ?Counseling  ?Your health care provider may ask you questions about your: ?Alcohol use. ?Tobacco use. ?Drug use. ?Emotional well-being. ?Home and relationship well-being. ?Sexual activity. ?Eating habits. ?History of falls. ?Memory and ability to understand (cognition). ?Work and work Statistician. ?Reproductive health. ?Screening  ?You may have the following tests or measurements: ?Height, weight, and BMI. ?Blood pressure. ?Lipid and cholesterol levels. These may be checked every 5 years, or more frequently if you are over 59 years old. ?Skin check. ?Lung cancer screening. You may have this screening every year starting at age 74 if you have a 30-pack-year history of smoking and currently smoke or have quit within the past 15 years. ?Fecal occult blood test (FOBT) of the stool. You may have this test every year starting at age 75. ?Flexible sigmoidoscopy or colonoscopy. You may have a sigmoidoscopy every 5 years or a colonoscopy every 10 years starting at age 80. ?Hepatitis C blood test. ?Hepatitis B blood test. ?Sexually transmitted disease (STD) testing. ?Diabetes screening. This is done by checking your blood sugar (glucose) after you have not eaten for a while (fasting). You may have this done every 1-3 years. ?Bone density scan. This is done to screen for osteoporosis. You may have this done starting at age 3. ?Mammogram. This may be done every 1-2 years. Talk to your health care provider about how often you should have regular mammograms. ?Talk with your health care provider about your test results, treatment options, and if necessary, the need for more tests. ?Vaccines  ?Your health care provider may recommend certain vaccines, such as: ?Influenza vaccine. This is recommended every year. ?Tetanus, diphtheria, and acellular pertussis (Tdap, Td) vaccine. You may need a Td booster every 10 years. ?Zoster vaccine. You may need  this after age 62. ?Pneumococcal 13-valent conjugate (PCV13) vaccine. One  dose is recommended after age 41. ?Pneumococcal polysaccharide (PPSV23) vaccine. One dose is recommended after age 68. ?Talk to your health care provider about which screenings and vaccines you need and how often you need them. ?This information is not intended to replace advice given to you by your health care provider. Make sure you discuss any questions you have with your health care provider. ?Document Released: 08/19/2015 Document Revised: 04/11/2016 Document Reviewed: 05/24/2015 ?Elsevier Interactive Patient Education ? 2017 [employer]. ? ?Fall Prevention in the Home ?Falls can cause injuries. They can happen to people of all ages. There are many things you can do to make your home safe and to help prevent falls. ?What can I do on the outside of my home? ?Regularly fix the edges of walkways and driveways and fix any cracks. ?Remove anything that might make you trip as you walk through a door, such as a raised step or threshold. ?Trim any bushes or trees on the path to your home. ?Use bright outdoor lighting. ?Clear any walking paths of anything that might make someone trip, such as rocks or tools. ?Regularly check to see if handrails are loose or broken. Make sure that both sides of any steps have handrails. ?Any raised decks and porches should have guardrails on the edges. ?Have any leaves, snow, or ice cleared regularly. ?Use sand or salt on walking paths during winter. ?Clean up any spills in your garage right away. This includes oil or grease spills. ?What can I do in the bathroom? ?Use night lights. ?Install grab bars by the toilet and in the tub and shower. Do not use towel bars as grab bars. ?Use non-skid mats or decals in the tub or shower. ?If you need to sit down in the shower, use a plastic, non-slip stool. ?Keep the floor dry. Clean up any water that spills on the floor as soon as it happens. ?Remove soap buildup in the tub or shower regularly. ?Attach bath mats securely with double-sided  non-slip rug tape. ?Do not have throw rugs and other things on the floor that can make you trip. ?What can I do in the bedroom? ?Use night lights. ?Make sure that you have a light by your bed that is easy to reach. ?Do not use any sheets or blankets that are too big for your bed. They should not hang down onto the floor. ?Have a firm chair that has side arms. You can use this for support while you get dressed. ?Do not have throw rugs and other things on the floor that can make you trip. ?What can I do in the kitchen? ?Clean up any spills right away. ?Avoid walking on wet floors. ?Keep items that you use a lot in easy-to-reach places. ?If you need to reach something above you, use a strong step stool that has a grab bar. ?Keep electrical cords out of the way. ?Do not use floor polish or wax that makes floors slippery. If you must use wax, use non-skid floor wax. ?Do not have throw rugs and other things on the floor that can make you trip. ?What can I do with my stairs? ?Do not leave any items on the stairs. ?Make sure that there are handrails on both sides of the stairs and use them. Fix handrails that are broken or loose. Make sure that handrails are as long as the stairways. ?Check any carpeting to make sure that it is firmly attached to  the stairs. Fix any carpet that is loose or worn. ?Avoid having throw rugs at the top or bottom of the stairs. If you do have throw rugs, attach them to the floor with carpet tape. ?Make sure that you have a light switch at the top of the stairs and the bottom of the stairs. If you do not have them, ask someone to add them for you. ?What else can I do to help prevent falls? ?Wear shoes that: ?Do not have high heels. ?Have rubber bottoms. ?Are comfortable and fit you well. ?Are closed at the toe. Do not wear sandals. ?If you use a stepladder: ?Make sure that it is fully opened. Do not climb a closed stepladder. ?Make sure that both sides of the stepladder are locked into place. ?Ask  someone to hold it for you, if possible. ?Clearly mark and make sure that you can see: ?Any grab bars or handrails. ?First and last steps. ?Where the edge of each step is. ?Use tools that help you move around (mobili

## 2021-12-14 ENCOUNTER — Other Ambulatory Visit: Payer: Self-pay

## 2021-12-14 DIAGNOSIS — C50212 Malignant neoplasm of upper-inner quadrant of left female breast: Secondary | ICD-10-CM

## 2021-12-15 ENCOUNTER — Inpatient Hospital Stay: Payer: Medicare PPO | Attending: Internal Medicine | Admitting: Internal Medicine

## 2021-12-15 DIAGNOSIS — M810 Age-related osteoporosis without current pathological fracture: Secondary | ICD-10-CM | POA: Diagnosis present

## 2021-12-15 DIAGNOSIS — Z87891 Personal history of nicotine dependence: Secondary | ICD-10-CM | POA: Insufficient documentation

## 2021-12-15 DIAGNOSIS — Z853 Personal history of malignant neoplasm of breast: Secondary | ICD-10-CM | POA: Diagnosis not present

## 2021-12-15 NOTE — Progress Notes (Signed)
[facility] ?CONSULT NOTE ? ?Patient Care Team: ?Teodora Medici, DO as PCP - General (Internal Medicine) ?Corey Skains, MD as Consulting Physician (Cardiology) ?Alice Reichert, Benay Pike, MD as Consulting Physician (Gastroenterology) ? ?CHIEF COMPLAINTS/PURPOSE OF CONSULTATION: Breast cancer ? ?Oncology History  ? No history exists.  ? ? ? ? ?HISTORY OF PRESENTING ILLNESS: Alone.  Ambulating independently. ? ?Toni Callahan 81 y.o.  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 ?Patient denies any new lumps or bumps.  Appetite is  with no weight loss no nausea no vomiting.  ? ?Review of Systems  ?Constitutional:  Negative for chills, diaphoresis, fever, malaise/fatigue and weight loss.  ?HENT:  Negative for nosebleeds and sore throat.   ?Eyes:  Negative for double vision.  ?Respiratory:  Negative for cough, hemoptysis, sputum production, shortness of breath and wheezing.   ?Cardiovascular:  Negative for chest pain, palpitations, orthopnea and leg swelling.  ?Gastrointestinal:  Negative for abdominal pain, blood in stool, constipation, diarrhea, heartburn, melena, nausea and vomiting.  ?Genitourinary:  Negative for dysuria, frequency and urgency.  ?Musculoskeletal:  Negative for back pain and joint pain.  ?Skin: Negative.  Negative for itching and rash.  ?Neurological:  Negative for dizziness, tingling, focal weakness, weakness and headaches.  ?Endo/Heme/Allergies:  Does not bruise/bleed easily.  ?Psychiatric/Behavioral:  Negative for depression. The patient is not nervous/anxious and does not have insomnia.    ? ? ?MEDICAL HISTORY:  ?Past Medical History:  ?Diagnosis Date  ? Allergy   ? Breast cancer ([place]) 2009  ? left /chemo/rad  ? Cancer [facility]) 2009  ? left breast, T1, N0, M0. ER/PR positive, HER2 neu positive  ? Dental bridge present   ? permanant - upper  ? GERD (gastroesophageal reflux disease)   ?  Hyperlipidemia   ? Personal history of chemotherapy   ? Personal history of radiation therapy   ? Personal history of tobacco use, presenting hazards to health   ? SVT (supraventricular tachycardia) ([place])   ? Tachycardia   ? Tubular adenoma of colon   ? ? ?SURGICAL HISTORY: ?Past Surgical History:  ?Procedure Laterality Date  ? BREAST BIOPSY Left 2009  ? positive  ? BREAST CYST ASPIRATION Bilateral 2005  ? neg  ? BREAST LUMPECTOMY Left 2009  ? positive  ? BREAST SURGERY Left 2009  ? lumpectomy  ? CATARACT EXTRACTION W/ INTRAOCULAR LENS IMPLANT Bilateral   ? COLONOSCOPY  [id number]  ? Dr. Candace Cruise  ? COLONOSCOPY N/A 09/07/2015  ? Procedure: COLONOSCOPY;  Surgeon: Hulen Luster, MD;  Location: [facility];  Service: Gastroenterology;  Laterality: N/A;  ? COLONOSCOPY WITH PROPOFOL N/A 06/15/2019  ? Procedure: COLONOSCOPY WITH PROPOFOL;  Surgeon: Toledo, Benay Pike, MD;  Location: [facility] ENDOSCOPY;  Service: Endoscopy;  Laterality: N/A;  ? POLYPECTOMY  09/07/2015  ? Procedure: POLYPECTOMY;  Surgeon: Hulen Luster, MD;  Location: [facility];  Service: Gastroenterology;;  ? [place] REMOVAL  2011  ? PORTACATH PLACEMENT  2009  ? TONSILLECTOMY    ? TONSILLECTOMY    ? ? ?SOCIAL HISTORY: ?Social History  ? ?Socioeconomic History  ? Marital status: Married  ?  Spouse name: Not on file  ? Number of children: 0  ? Years of education: Not on file  ? Highest education level: Bachelor's degree (e.g., BA, AB, BS)  ?Occupational History  ? Not on file  ?Tobacco Use  ? Smoking status: Former  ?  Years: 20.00  ?  Types: Cigarettes  ?  Quit date: 08/06/1984  ?  Years since quitting: 37.3  ? Smokeless tobacco: Never  ? Tobacco comments:  ?  quit in 1986 approximately  ?Vaping Use  ? Vaping Use: Never used  ?Substance and Sexual Activity  ? Alcohol use: Yes  ?  Comment: drinks rarely  ? Drug use: No  ? Sexual activity: Yes  ?Other Topics Concern  ? Not on file  ?Social History Narrative  ? Not on file  ? ?Social Determinants of Health   ? ?Financial Resource Strain: Low Risk   ? Difficulty of Paying Living Expenses: Not hard at all  ?Food Insecurity: No Food Insecurity  ? Worried About Charity fundraiser in the Last Year: Never true  ? Ran Out of Food in the Last Year: Never true  ?Transportation Needs: No Transportation Needs  ? Lack of Transportation (Medical): No  ? Lack of Transportation (Non-Medical): No  ?Physical Activity: Inactive  ? Days of Exercise per Week: 0 days  ? Minutes of Exercise per Session: 0 min  ?Stress: No Stress Concern Present  ? Feeling of Stress : Not at all  ?Social Connections: Moderately Integrated  ? Frequency of Communication with Friends and Family: More than three times a week  ? Frequency of Social Gatherings with Friends and Family: Three times a week  ? Attends Religious Services: More than 4 times per year  ? Active Member of Clubs or Organizations: No  ? Attends Archivist Meetings: Never  ? Marital Status: Married  ?Intimate Partner Violence: Not At Risk  ? Fear of Current or Ex-Partner: No  ? Emotionally Abused: No  ? Physically Abused: No  ? Sexually Abused: No  ? ? ?FAMILY HISTORY: ?Family History  ?Problem Relation Age of Onset  ? Stroke Mother   ? Heart disease Father   ? Diabetes Brother   ? Heart disease Paternal Grandmother   ? Cancer Neg Hx   ? COPD Neg Hx   ? Hypertension Neg Hx   ? Breast cancer Neg Hx   ? ? ?ALLERGIES:  is allergic to sulfa antibiotics. ? ?MEDICATIONS:  ?Current Outpatient Medications  ?Medication Sig Dispense Refill  ? Apoaequorin (PREVAGEN PO) Take by mouth.    ? aspirin 81 MG tablet Take 81 mg by mouth daily.    ? atorvastatin (LIPITOR) 10 MG tablet One by mouth at bedtime three nights a week 36 tablet 3  ? Biotin 5000 MCG CAPS Take 1 capsule by mouth daily.    ? Calcium Carbonate (CALCIUM 600 PO) Take 1 tablet by mouth every other day.     ? Cholecalciferol (VITAMIN D PO) Take 1,000 Int'l Units by mouth daily.    ? Cyanocobalamin (VITAMIN B 12 PO) Take 500 mg by  mouth daily.    ? folic acid (FOLVITE) 354 MCG tablet Take 400 mcg by mouth daily.    ? Magnesium 250 MG TABS Take 1 tablet by mouth daily.    ? metoprolol succinate (TOPROL-XL) 50 MG 24 hr tablet Take 1 tablet (50 mg total) by mouth daily. 90 tablet 3  ? Multiple Vitamin (MULTIVITAMIN) tablet Take 1 tablet by mouth daily.    ? Omega-3 Fatty Acids (FISH OIL) 1000 MG CAPS Take 1 capsule by mouth every other day.     ? Potassium Gluconate 595 MG CAPS Take by mouth daily.    ? sertraline (ZOLOFT) 50 MG tablet Take 1 tablet by  mouth daily.    ? vitamin C (ASCORBIC ACID) 500 MG tablet Take 500 mg by mouth every other day.     ? zinc gluconate 50 MG tablet Take 50 mg by mouth daily.    ? fluticasone (FLONASE) 50 MCG/ACT nasal spray Place 2 sprays into both nostrils daily. (Patient not taking: Reported on 12/15/2021) 16 g 6  ? ?No current facility-administered medications for this visit.  ? ? ? ? ?PHYSICAL EXAMINATION: ? ? ?Vitals:  ? 12/15/21 1401  ?BP: 127/64  ?Pulse: 66  ?Temp: 98.4 ?F (36.9 ?C)  ?SpO2: 99%  ? ?Filed Weights  ? 12/15/21 1401  ?Weight: 154 lb (69.9 kg)  ? ? ?Physical Exam ?Vitals and nursing note reviewed.  ?Constitutional:   ?   Comments:  ? ?  ?HENT:  ?   Head: Normocephalic and atraumatic.  ?   Mouth/Throat:  ?   Mouth: Mucous membranes are moist.  ?   Pharynx: Oropharynx is clear. No oropharyngeal exudate.  ?Eyes:  ?   Extraocular Movements: Extraocular movements intact.  ?   Pupils: Pupils are equal, round, and reactive to light.  ?Cardiovascular:  ?   Rate and Rhythm: Normal rate and regular rhythm.  ?Pulmonary:  ?   Effort: No respiratory distress.  ?   Breath sounds: No wheezing.  ?   Comments: Decreased breath sounds bilaterally.  ?Abdominal:  ?   General: Bowel sounds are normal. There is no distension.  ?   Palpations: Abdomen is soft. There is no mass.  ?   Tenderness: There is no abdominal tenderness. There is no guarding or rebound.  ?Musculoskeletal:     ?   General: No tenderness. Normal  range of motion.  ?   Cervical back: Normal range of motion and neck supple.  ?Skin: ?   General: Skin is warm.  ?Neurological:  ?   General: No focal deficit present.  ?   Mental Status: She is alert and oriented t

## 2021-12-15 NOTE — Assessment & Plan Note (Addendum)
#  Stage I ER positive Her2/neu+ left breast cancer - s/p lumpectomy, chemotherapy, and 10 years adjuvant femara completed in 2020. Mammo- Bil Nov 2022- WNL.  Discussed about labs.  Patient wants to hold off. ?? ?# Osteoporosis- On fosamax per pcp. PCP to manage bone density screenings, calcium and vitamin d.  ?? ?# DISPOSITION:  ?# Mammogram 06/2022 ?# Follow up in 12 months; MD; labs- cbc/cmp-Dr.B ?

## 2021-12-15 NOTE — Addendum Note (Signed)
Addended by: Vanice Sarah on: 12/15/2021 03:33 PM ? ? Modules accepted: Orders ? ?

## 2022-03-08 ENCOUNTER — Ambulatory Visit: Payer: Medicare PPO | Admitting: Internal Medicine

## 2022-03-08 ENCOUNTER — Encounter: Payer: Self-pay | Admitting: Internal Medicine

## 2022-03-08 VITALS — BP 138/70 | HR 63 | Temp 98.0°F | Resp 16 | Ht 64.0 in | Wt 151.3 lb

## 2022-03-08 DIAGNOSIS — E785 Hyperlipidemia, unspecified: Secondary | ICD-10-CM | POA: Diagnosis not present

## 2022-03-08 DIAGNOSIS — G3184 Mild cognitive impairment, so stated: Secondary | ICD-10-CM | POA: Diagnosis not present

## 2022-03-08 DIAGNOSIS — I10 Essential (primary) hypertension: Secondary | ICD-10-CM

## 2022-03-08 MED ORDER — ATORVASTATIN CALCIUM 10 MG PO TABS: ORAL_TABLET | ORAL | 3 refills | Status: AC

## 2022-03-08 MED ORDER — SERTRALINE HCL 50 MG PO TABS: 50.0000 mg | ORAL_TABLET | Freq: Every day | ORAL | 1 refills | Status: DC

## 2022-03-08 NOTE — Patient Instructions (Addendum)
It was great seeing you today!  Plan discussed at today's visit: -Blood work ordered today, results will be uploaded to [facility]. Please return while fasting for labs during the week. Lab is open 8-12 and then 2-4.  -Refills sent to pharmacy  -Mammogram scheduled for 11/15  Follow up in 6 months   Take care and let us know if you have any questions or concerns prior to your next visit.  Dr. Rosana Berger

## 2022-03-08 NOTE — Progress Notes (Signed)
Established Patient Office Visit  Subjective:  Patient ID: Toni Callahan, female    DOB: 1940/09/13  Age: 81 y.o. MRN: [id number]  CC:  Chief Complaint  Patient presents with   Follow-up   Hypertension   Hyperlipidemia    HPI Toni Callahan presents for follow up on chronic medical conditions.   Hypertension/SVT: -Medications: Metoprolol 50 XL -Following with Cardiology at [facility] every 6 months, note reviewed from 08/21/21 -Patient is compliant with above medications and reports no side effects. -Checking BP at home (average): Not checking  -Denies any SOB, CP, vision changes, LE edema or symptoms of hypotension  HLD: -Medications: Lipitor 10 mg 3x a week, fish oil  -Patient is compliant with above medications and reports no side effects.  -Does have a family history of stroke - mother had a stroke at age 30 -Last lipid panel: 6/22 Lipid Panel     Component Value Date/Time   CHOL 183 01/16/2021 1032   CHOL 162 09/29/2015 0835   TRIG 174 (H) 01/16/2021 1032   HDL 44 (L) 01/16/2021 1032   HDL 36 (L) 09/29/2015 0835   CHOLHDL 4.2 01/16/2021 1032   VLDL 42 (H) 03/11/2017 0921   LDLCALC 110 (H) 01/16/2021 1032   LABVLDL 50 (H) 09/29/2015 0835   Hx of Breast Cancer: -On the left side, first diagnosed in 2009 -s/p chemo/radiation and lumpectomy   Osteoporosis: -Last DEXA 1/22 with T score improvement -Currently on Calcium and Vitamin D  MCI: -Following with Geriatric Medicine, last seen on 10/16/21 -Currently on Zoloft 50 mg, doing well   Health Maintenance: -Mammogram: 11/22 [facility] 1 -Blood work due   Past Medical History:  Diagnosis Date   Allergy    Breast cancer ([place]) 2009   left /chemo/rad   Cancer ([place]) 2009   left breast, T1, N0, M0. ER/PR positive, HER2 neu positive   Dental bridge present    permanant - upper   GERD (gastroesophageal reflux disease)    Hyperlipidemia    Personal history of chemotherapy    Personal history of radiation  therapy    Personal history of tobacco use, presenting hazards to health    SVT (supraventricular tachycardia) ([place])    Tachycardia    Tubular adenoma of colon     Past Surgical History:  Procedure Laterality Date   BREAST BIOPSY Left 2009   positive   BREAST CYST ASPIRATION Bilateral 2005   neg   BREAST LUMPECTOMY Left 2009   positive   BREAST SURGERY Left 2009   lumpectomy   CATARACT EXTRACTION W/ INTRAOCULAR LENS IMPLANT Bilateral    COLONOSCOPY  [id number]   Dr. Candace Cruise   COLONOSCOPY N/A 09/07/2015   Procedure: COLONOSCOPY;  Surgeon: Hulen Luster, MD;  Location: [facility];  Service: Gastroenterology;  Laterality: N/A;   COLONOSCOPY WITH PROPOFOL N/A 06/15/2019   Procedure: COLONOSCOPY WITH PROPOFOL;  Surgeon: Toledo, Benay Pike, MD;  Location: [facility] ENDOSCOPY;  Service: Endoscopy;  Laterality: N/A;   POLYPECTOMY  09/07/2015   Procedure: POLYPECTOMY;  Surgeon: Hulen Luster, MD;  Location: [place];  Service: Gastroenterology;;   [facility] REMOVAL  2011   PORTACATH PLACEMENT  2009   TONSILLECTOMY     TONSILLECTOMY      Family History  Problem Relation Age of Onset   Stroke Mother    Heart disease Father    Diabetes Brother    Heart disease Paternal Grandmother    Cancer Neg Hx    COPD Neg Hx  Hypertension Neg Hx    Breast cancer Neg Hx     Social History   Socioeconomic History   Marital status: Married    Spouse name: Not on file   Number of children: 0   Years of education: Not on file   Highest education level: Bachelor's degree (e.g., BA, AB, BS)  Occupational History   Not on file  Tobacco Use   Smoking status: Former    Years: 20.00    Types: Cigarettes    Quit date: 08/06/1984    Years since quitting: 37.6   Smokeless tobacco: Never   Tobacco comments:    quit in 1986 approximately  Vaping Use   Vaping Use: Never used  Substance and Sexual Activity   Alcohol use: Yes    Comment: drinks rarely   Drug use: No   Sexual activity: Yes   Other Topics Concern   Not on file  Social History Narrative   Not on file   Social Determinants of Health   Financial Resource Strain: Low Risk  (11/28/2021)   Overall Financial Resource Strain (CARDIA)    Difficulty of Paying Living Expenses: Not hard at all  Food Insecurity: No Food Insecurity (11/28/2021)   Hunger Vital Sign    Worried About Running Out of Food in the Last Year: Never true    Ran Out of Food in the Last Year: Never true  Transportation Needs: No Transportation Needs (11/28/2021)   PRAPARE - Hydrologist (Medical): No    Lack of Transportation (Non-Medical): No  Physical Activity: Inactive (11/28/2021)   Exercise Vital Sign    Days of Exercise per Week: 0 days    Minutes of Exercise per Session: 0 min  Stress: No Stress Concern Present (11/28/2021)   [place]    Feeling of Stress : Not at all  Social Connections: Moderately Integrated (11/28/2021)   Social Connection and Isolation Panel [NHANES]    Frequency of Communication with Friends and Family: More than three times a week    Frequency of Social Gatherings with Friends and Family: Three times a week    Attends Religious Services: More than 4 times per year    Active Member of Clubs or Organizations: No    Attends Archivist Meetings: Never    Marital Status: Married  Human resources officer Violence: Not At Risk (11/28/2021)   Humiliation, Afraid, Rape, and Kick questionnaire    Fear of Current or Ex-Partner: No    Emotionally Abused: No    Physically Abused: No    Sexually Abused: No    Outpatient Medications Prior to Visit  Medication Sig Dispense Refill   Apoaequorin (PREVAGEN PO) Take by mouth.     aspirin 81 MG tablet Take 81 mg by mouth daily.     atorvastatin (LIPITOR) 10 MG tablet One by mouth at bedtime three nights a week 36 tablet 3   Biotin 5000 MCG CAPS Take 1 capsule by mouth daily.      Calcium Carbonate (CALCIUM 600 PO) Take 1 tablet by mouth every other day.      Cholecalciferol (VITAMIN D PO) Take 1,000 Int'l Units by mouth daily.     Cyanocobalamin (VITAMIN B 12 PO) Take 500 mg by mouth daily.     folic acid (FOLVITE) 195 MCG tablet Take 400 mcg by mouth daily.     Magnesium 250 MG TABS Take 1 tablet by mouth daily.  metoprolol succinate (TOPROL-XL) 50 MG 24 hr tablet Take 1 tablet (50 mg total) by mouth daily. 90 tablet 3   Multiple Vitamin (MULTIVITAMIN) tablet Take 1 tablet by mouth daily.     Omega-3 Fatty Acids (FISH OIL) 1000 MG CAPS Take 1 capsule by mouth every other day.      Potassium Gluconate 595 MG CAPS Take by mouth daily.     sertraline (ZOLOFT) 50 MG tablet Take 1 tablet by mouth daily.     vitamin C (ASCORBIC ACID) 500 MG tablet Take 500 mg by mouth every other day.      zinc gluconate 50 MG tablet Take 50 mg by mouth daily.     fluticasone (FLONASE) 50 MCG/ACT nasal spray Place 2 sprays into both nostrils daily. (Patient not taking: Reported on 12/15/2021) 16 g 6   No facility-administered medications prior to visit.    Allergies  Allergen Reactions   Sulfa Antibiotics Nausea Only and Other (See Comments)    Upset stomach    ROS Review of Systems  Constitutional:  Negative for chills and fever.  Respiratory:  Negative for cough and shortness of breath.   Cardiovascular:  Negative for chest pain.  Gastrointestinal:  Negative for abdominal pain, diarrhea, nausea and vomiting.  Neurological:  Negative for dizziness and headaches.      Objective:    Physical Exam Constitutional:      Appearance: Normal appearance.  HENT:     Head: Normocephalic and atraumatic.     Mouth/Throat:     Mouth: Mucous membranes are moist.     Pharynx: Oropharynx is clear.  Eyes:     Conjunctiva/sclera: Conjunctivae normal.  Cardiovascular:     Rate and Rhythm: Normal rate and regular rhythm.  Pulmonary:     Effort: Pulmonary effort is normal.     Breath  sounds: Normal breath sounds.  Musculoskeletal:     Right lower leg: No edema.     Left lower leg: No edema.  Skin:    General: Skin is warm and dry.  Neurological:     General: No focal deficit present.     Mental Status: She is alert. Mental status is at baseline.  Psychiatric:        Mood and Affect: Mood normal.        Behavior: Behavior normal.     BP 138/70   Pulse 63   Temp 98 F (36.7 C) (Oral)   Resp 16   Ht _0  (1.626 m)   Wt 151 lb 4.8 oz (68.6 kg)   SpO2 98%   BMI 25.97 kg/m  Wt Readings from Last 3 Encounters:  03/08/22 151 lb 4.8 oz (68.6 kg)  12/15/21 154 lb (69.9 kg)  09/08/21 154 lb (69.9 kg)     Health Maintenance Due  Topic Date Due   COVID-19 Vaccine (4 - Booster for Moderna series) 08/13/2020   INFLUENZA VACCINE  03/06/2022    There are no preventive care reminders to display for this patient.  No results found for: "TSH" Lab Results  Component Value Date   WBC 6.4 12/15/2020   HGB 13.3 12/15/2020   HCT 39.6 12/15/2020   MCV 91.5 12/15/2020   PLT 260 12/15/2020   Lab Results  Component Value Date   NA 136 12/15/2020   K 4.1 12/15/2020   CO2 23 12/15/2020   GLUCOSE 124 (H) 12/15/2020   BUN 13 12/15/2020   CREATININE 0.76 12/15/2020   BILITOT 0.8 12/15/2020   ALKPHOS  43 12/15/2020   AST 20 12/15/2020   ALT 19 12/15/2020   PROT 7.0 12/15/2020   ALBUMIN 3.9 12/15/2020   CALCIUM 9.0 12/15/2020   ANIONGAP 6 12/15/2020   Lab Results  Component Value Date   CHOL 183 01/16/2021   Lab Results  Component Value Date   HDL 44 (L) 01/16/2021   Lab Results  Component Value Date   LDLCALC 110 (H) 01/16/2021   Lab Results  Component Value Date   TRIG 174 (H) 01/16/2021   Lab Results  Component Value Date   CHOLHDL 4.2 01/16/2021   Lab Results  Component Value Date   HGBA1C 5.3 03/11/2017      Assessment & Plan:   1. Benign essential HTN: Blood pressure borderline today, continue Metoprolol 50 mg XL. Following with  Cardiology, note reviewed from 08/21/21. Continue to monitor. Due for annual screening labs.   - CBC w/Diff/Platelet - COMPLETE METABOLIC PANEL WITH GFR  2. Hyperlipidemia LDL goal <100: Check lipid panel tomorrow when she is fasting. Continue Lipitor 10 mg 3x weekly. Refilled today.   - Lipid Profile - atorvastatin (LIPITOR) 10 MG tablet; One by mouth at bedtime three nights a week  Dispense: 36 tablet; Refill: 3  3. MCI (mild cognitive impairment): Zoloft 50 mg refilled today.   - sertraline (ZOLOFT) 50 MG tablet; Take 1 tablet (50 mg total) by mouth daily.  Dispense: 90 tablet; Refill: 1   Meds ordered this encounter  Medications   atorvastatin (LIPITOR) 10 MG tablet    Sig: One by mouth at bedtime three nights a week    Dispense:  36 tablet    Refill:  3   sertraline (ZOLOFT) 50 MG tablet    Sig: Take 1 tablet (50 mg total) by mouth daily.    Dispense:  90 tablet    Refill:  1    Follow-up: Return in about 6 months (around 09/08/2022).    Teodora Medici, DO

## 2022-06-20 ENCOUNTER — Ambulatory Visit
Admission: RE | Admit: 2022-06-20 | Discharge: 2022-06-20 | Disposition: A | Discharge status: Home / Self Care | Payer: Medicare PPO | Source: Ambulatory Visit | Attending: Internal Medicine | Admitting: Internal Medicine

## 2022-06-20 DIAGNOSIS — Z1231 Encounter for screening mammogram for malignant neoplasm of breast: Secondary | ICD-10-CM | POA: Insufficient documentation

## 2022-06-20 DIAGNOSIS — Z853 Personal history of malignant neoplasm of breast: Secondary | ICD-10-CM | POA: Diagnosis present

## 2022-07-02 ENCOUNTER — Encounter: Payer: Self-pay | Admitting: Surgery

## 2022-07-02 ENCOUNTER — Ambulatory Visit: Payer: Medicare PPO | Admitting: Surgery

## 2022-07-02 VITALS — BP 146/71 | HR 80 | Temp 98.8°F | Wt 152.2 lb

## 2022-07-02 DIAGNOSIS — C50212 Malignant neoplasm of upper-inner quadrant of left female breast: Secondary | ICD-10-CM | POA: Diagnosis not present

## 2022-07-02 NOTE — Patient Instructions (Addendum)
If you have any concerns or questions, please feel free to call our office. Follow up in 1 year.   Breast Self-Awareness Breast self-awareness is knowing how your breasts look and feel. You need to: Check your breasts on a regular basis. Tell your doctor about any changes. Become familiar with the look and feel of your breasts. This can help you catch a breast problem while it is still small and can be treated. You should do breast self-exams even if you have breast implants. What you need: A mirror. A well-lit room. A pillow or other soft object. How to do a breast self-exam Follow these steps to do a breast self-exam: Look for changes  Take off all the clothes above your waist. Stand in front of a mirror in a room with good lighting. Put your hands down at your sides. Compare your breasts in the mirror. Look for any difference between them, such as: A difference in shape. A difference in size. Wrinkles, dips, and bumps in one breast and not the other. Look at each breast for changes in the skin, such as: Redness. Scaly areas. Skin that has gotten thicker. Dimpling. Open sores (ulcers). Look for changes in your nipples, such as: Fluid coming out of a nipple. Fluid around a nipple. Bleeding. Dimpling. Redness. A nipple that looks pushed in (retracted), or that has changed position. Feel for changes Lie on your back. Feel each breast. To do this: Pick a breast to feel. Place a pillow under the shoulder closest to that breast. Put the arm closest to that breast behind your head. Feel the nipple area of that breast using the hand of your other arm. Feel the area with the pads of your three middle fingers by making small circles with your fingers. Use light, medium, and firm pressure. Continue the overlapping circles, moving downward over the breast. Keep making circles with your fingers. Stop when you feel your ribs. Start making circles with your fingers again, this time going  upward until you reach your collarbone. Then, make circles outward across your breast and into your armpit area. Squeeze your nipple. Check for discharge and lumps. Repeat these steps to check your other breast. Sit or stand in the tub or shower. With soapy water on your skin, feel each breast the same way you did when you were lying down. Write down what you find Writing down what you find can help you remember what to tell your doctor. Write down: What is normal for each breast. Any changes you find in each breast. These include: The kind of changes you find. A tender or painful breast. Any lump you find. Write down its size and where it is. When you last had your monthly period (menstrual cycle). General tips If you are breastfeeding, the best time to check your breasts is after you feed your baby or after you use a breast pump. If you get monthly bleeding, the best time to check your breasts is 5-7 days after your monthly cycle ends. With time, you will become comfortable with the self-exam. You will also start to know if there are changes in your breasts. Contact a doctor if: You see a change in the shape or size of your breasts or nipples. You see a change in the skin of your breast or nipples, such as red or scaly skin. You have fluid coming from your nipples that is not normal. You find a new lump or thick area. You have breast pain. You  have any concerns about your breast health. Summary Breast self-awareness includes looking for changes in your breasts and feeling for changes within your breasts. You should do breast self-awareness in front of a mirror in a well-lit room. If you get monthly periods (menstrual cycles), the best time to check your breasts is 5-7 days after your period ends. Tell your doctor about any changes you see in your breasts. Changes include changes in size, changes on the skin, painful or tender breasts, or fluid from your nipples that is not  normal. This information is not intended to replace advice given to you by your health care provider. Make sure you discuss any questions you have with your health care provider. Document Revised: 12/28/2021 Document Reviewed: 05/25/2021 Elsevier Patient Education  [place].

## 2022-07-03 NOTE — Progress Notes (Signed)
Outpatient Surgical Follow Up  07/03/2022  Toni Callahan is an 81 y.o. female.   Chief Complaint  Patient presents with   Follow-up    1 yr f/u mammo 06/20/22    HPI:  Toni Callahan is  an 81 year old female with history of left breast cancer status post lumpectomy and sentinel lymph node biopsy and  Chemo/radiation therapy by Dr. Bary Castilla in 2009.  She has been doing very well.  No fevers no chills.  No new breast concerns.  No masses no weight loss no lymphadenopathy.  She did have a recent routine mammogram that have personally reviewed showing no evidence of new concerning lesions SHe is otherwise doing very well and has no new medical issues. SHe does self examinations every month. She is otherwise doing well, she is very functional and is in great spirits  Past Medical History:  Diagnosis Date   Allergy    Breast cancer ([place]) 2009   left /chemo/rad   Cancer ([place]) 2009   left breast, T1, N0, M0. ER/PR positive, HER2 neu positive   Dental bridge present    permanant - upper   GERD (gastroesophageal reflux disease)    Hyperlipidemia    Personal history of chemotherapy    Personal history of radiation therapy    Personal history of tobacco use, presenting hazards to health    SVT (supraventricular tachycardia)    Tachycardia    Tubular adenoma of colon     Past Surgical History:  Procedure Laterality Date   BREAST BIOPSY Left 2009   positive   BREAST CYST ASPIRATION Bilateral 2005   neg   BREAST LUMPECTOMY Left 2009   positive   BREAST SURGERY Left 2009   lumpectomy   CATARACT EXTRACTION W/ INTRAOCULAR LENS IMPLANT Bilateral    COLONOSCOPY  [id number]   Dr. Candace Cruise   COLONOSCOPY N/A 09/07/2015   Procedure: COLONOSCOPY;  Surgeon: Hulen Luster, MD;  Location: [facility];  Service: Gastroenterology;  Laterality: N/A;   COLONOSCOPY WITH PROPOFOL N/A 06/15/2019   Procedure: COLONOSCOPY WITH PROPOFOL;  Surgeon: Toledo, Benay Pike, MD;  Location: [facility];   Service: Endoscopy;  Laterality: N/A;   POLYPECTOMY  09/07/2015   Procedure: POLYPECTOMY;  Surgeon: Hulen Luster, MD;  Location: [place];  Service: Gastroenterology;;   [facility] REMOVAL  2011   PORTACATH PLACEMENT  2009   TONSILLECTOMY     TONSILLECTOMY      Family History  Problem Relation Age of Onset   Stroke Mother    Heart disease Father    Diabetes Brother    Heart disease Paternal Grandmother    Cancer Neg Hx    COPD Neg Hx    Hypertension Neg Hx    Breast cancer Neg Hx     Social History:  reports that she quit smoking about 37 years ago. Her smoking use included cigarettes. She has never used smokeless tobacco. She reports current alcohol use. She reports that she does not use drugs.  Allergies:  Allergies  Allergen Reactions   Sulfa Antibiotics Nausea Only and Other (See Comments)    Upset stomach    Medications reviewed.    ROS Full ROS performed and is otherwise negative other than what is stated in HPI   BP (!) 146/71   Pulse 80   Temp 98.8 F (37.1 C) (Oral)   Wt 152 lb 3.2 oz (69 kg)   SpO2 96%   BMI 26.13 kg/m   Physical Exam  Vitals  and nursing note reviewed. Exam conducted with a chaperone present.  Constitutional:      General: She is not in acute distress.    Appearance: Normal appearance. She is normal weight.  Eyes:     General: No scleral icterus.       Right eye: No discharge.        Left eye: No discharge.  Cardiovascular:     Rate and Rhythm: Normal rate and regular rhythm.     Heart sounds: No murmur.  Pulmonary:     Effort: Pulmonary effort is normal. No respiratory distress.     Breath sounds: Normal breath sounds.     Comments: BREAST: Left lumpectomy scar and sentinel lymph node biopsy scar.  No evidence of new concerning lesions on either breast.  No evidence of lymphadenopathy. Abdominal:     General: Abdomen is flat. There is no distension.     Palpations: Abdomen is soft. There is no mass.     Tenderness:  There is no abdominal tenderness. There is no guarding or rebound.     Hernia: No hernia is present.  Musculoskeletal:     Cervical back: Normal range of motion and neck supple. No rigidity or tenderness.  Skin:    General: Skin is warm and dry.     Capillary Refill: Capillary refill takes less than 2 seconds.  Neurological:     General: No focal deficit present.     Mental Status: She is alert and oriented to person, place, and time.  Psychiatric:        Mood and Affect: Mood normal.        Behavior: Behavior normal.        Thought Content: Thought content normal.       Judgment: Judgment normal   Assessment/Plan: 28 female with a history of left breast  cancer status postlumpectomy and radiation therapy now with no evidence of recurrence.  Normal mammogram and normal physical exam.  We will continue to follow her on a yearly basis per her request.  Please note that I have spent 30 minutes in this encounter including personally reviewing imaging studies, coordinating her care, counseling the patient and performing appropriate documentation  Caroleen Hamman, MD [place] Surgeon

## 2022-08-08 NOTE — Progress Notes (Deleted)
Established Patient Office Visit  Subjective:  Patient ID: Toni Callahan, female    DOB: 02-07-1941  Age: 82 y.o. MRN: [id number]  CC:  No chief complaint on file.   HPI Toni Callahan presents for follow up on chronic medical conditions.   Hypertension/SVT: -Medications: Metoprolol 50 mg XL -Following with Cardiology at [facility] every 6 months, note reviewed from 08/21/21 -Patient is compliant with above medications and reports no side effects. -Checking BP at home (average): Not checking  -Denies any SOB, CP, vision changes, LE edema or symptoms of hypotension  HLD: -Medications: Lipitor 10 mg 3x a week, fish oil  -Patient is compliant with above medications and reports no side effects.  -Does have a family history of stroke - mother had a stroke at age 29 -Last lipid panel: 6/22 Lipid Panel     Component Value Date/Time   CHOL 183 01/16/2021 1032   CHOL 162 09/29/2015 0835   TRIG 174 (H) 01/16/2021 1032   HDL 44 (L) 01/16/2021 1032   HDL 36 (L) 09/29/2015 0835   CHOLHDL 4.2 01/16/2021 1032   VLDL 42 (H) 03/11/2017 0921   LDLCALC 110 (H) 01/16/2021 1032   LABVLDL 50 (H) 09/29/2015 0835   Hx of Breast Cancer: -On the left side, first diagnosed in 2009 -s/p chemo/radiation and lumpectomy   Osteoporosis: -Last DEXA 1/22 with T score improvement -Currently on Calcium and Vitamin D  MCI: -Following with Geriatric Medicine, last seen on 10/16/21 -Currently on Zoloft 50 mg, doing well   Health Maintenance: -Mammogram: 11/23 [facility] 1 -Blood work due   Past Medical History:  Diagnosis Date   Allergy    Breast cancer ([place]) 2009   left /chemo/rad   Cancer ([place]) 2009   left breast, T1, N0, M0. ER/PR positive, HER2 neu positive   Dental bridge present    permanant - upper   GERD (gastroesophageal reflux disease)    Hyperlipidemia    Personal history of chemotherapy    Personal history of radiation therapy    Personal history of tobacco use, presenting hazards  to health    SVT (supraventricular tachycardia)    Tachycardia    Tubular adenoma of colon     Past Surgical History:  Procedure Laterality Date   BREAST BIOPSY Left 2009   positive   BREAST CYST ASPIRATION Bilateral 2005   neg   BREAST LUMPECTOMY Left 2009   positive   BREAST SURGERY Left 2009   lumpectomy   CATARACT EXTRACTION W/ INTRAOCULAR LENS IMPLANT Bilateral    COLONOSCOPY  [id number]   Dr. Candace Cruise   COLONOSCOPY N/A 09/07/2015   Procedure: COLONOSCOPY;  Surgeon: Hulen Luster, MD;  Location: [facility];  Service: Gastroenterology;  Laterality: N/A;   COLONOSCOPY WITH PROPOFOL N/A 06/15/2019   Procedure: COLONOSCOPY WITH PROPOFOL;  Surgeon: Toledo, Benay Pike, MD;  Location: [facility] END[facility];  Service: Endoscopy;  Laterality: N/A;   POLYPECTOMY  09/07/2015   Procedure: POLYPECTOMY;  Surgeon: Hulen Luster, MD;  Location: [facility];  Service: Gastroenterology;;   [facility] REMOVAL  2011   PORTACATH PLACEMENT  2009   TONSILLECTOMY     TONSILLECTOMY      Family History  Problem Relation Age of Onset   Stroke Mother    Heart disease Father    Diabetes Brother    Heart disease Paternal Grandmother    Cancer Neg Hx    COPD Neg Hx    Hypertension Neg Hx    Breast cancer  Neg Hx     Social History   Socioeconomic History   Marital status: Married    Spouse name: Not on file   Number of children: 0   Years of education: Not on file   Highest education level: Bachelor's degree (e.g., BA, AB, BS)  Occupational History   Not on file  Tobacco Use   Smoking status: Former    Years: 20.00    Types: Cigarettes    Quit date: 08/06/1984    Years since quitting: 38.0   Smokeless tobacco: Never   Tobacco comments:    quit in 1986 approximately  Vaping Use   Vaping Use: Never used  Substance and Sexual Activity   Alcohol use: Yes    Comment: drinks rarely   Drug use: No   Sexual activity: Yes  Other Topics Concern   Not on file  Social History Narrative   Not on  file   Social Determinants of Health   Financial Resource Strain: Low Risk  (11/28/2021)   Overall Financial Resource Strain (CARDIA)    Difficulty of Paying Living Expenses: Not hard at all  Food Insecurity: No Food Insecurity (11/28/2021)   Hunger Vital Sign    Worried About Running Out of Food in the Last Year: Never true    Ran Out of Food in the Last Year: Never true  Transportation Needs: No Transportation Needs (11/28/2021)   PRAPARE - Hydrologist (Medical): No    Lack of Transportation (Non-Medical): No  Physical Activity: Inactive (11/28/2021)   Exercise Vital Sign    Days of Exercise per Week: 0 days    Minutes of Exercise per Session: 0 min  Stress: No Stress Concern Present (11/28/2021)   [place]    Feeling of Stress : Not at all  Social Connections: Moderately Integrated (11/28/2021)   Social Connection and Isolation Panel [NHANES]    Frequency of Communication with Friends and Family: More than three times a week    Frequency of Social Gatherings with Friends and Family: Three times a week    Attends Religious Services: More than 4 times per year    Active Member of Clubs or Organizations: No    Attends Archivist Meetings: Never    Marital Status: Married  Human resources officer Violence: Not At Risk (11/28/2021)   Humiliation, Afraid, Rape, and Kick questionnaire    Fear of Current or Ex-Partner: No    Emotionally Abused: No    Physically Abused: No    Sexually Abused: No    Outpatient Medications Prior to Visit  Medication Sig Dispense Refill   Apoaequorin (PREVAGEN PO) Take by mouth.     aspirin 81 MG tablet Take 81 mg by mouth daily.     atorvastatin (LIPITOR) 10 MG tablet One by mouth at bedtime three nights a week 36 tablet 3   Biotin 5000 MCG CAPS Take 1 capsule by mouth daily.     Calcium Carbonate (CALCIUM 600 PO) Take 1 tablet by mouth every other day.       Cholecalciferol (VITAMIN D PO) Take 1,000 Int'l Units by mouth daily.     Cyanocobalamin (VITAMIN B 12 PO) Take 500 mg by mouth daily.     folic acid (FOLVITE) 478 MCG tablet Take 400 mcg by mouth daily.     Magnesium 250 MG TABS Take 1 tablet by mouth daily.     metoprolol succinate (TOPROL-XL) 50 MG  24 hr tablet Take 1 tablet (50 mg total) by mouth daily. 90 tablet 3   Multiple Vitamin (MULTIVITAMIN) tablet Take 1 tablet by mouth daily.     Omega-3 Fatty Acids (FISH OIL) 1000 MG CAPS Take 1 capsule by mouth every other day.      Potassium Gluconate 595 MG CAPS Take by mouth daily.     sertraline (ZOLOFT) 50 MG tablet Take 1 tablet (50 mg total) by mouth daily. 90 tablet 1   vitamin C (ASCORBIC ACID) 500 MG tablet Take 500 mg by mouth every other day.      zinc gluconate 50 MG tablet Take 50 mg by mouth daily.     No facility-administered medications prior to visit.    Allergies  Allergen Reactions   Sulfa Antibiotics Nausea Only and Other (See Comments)    Upset stomach    ROS Review of Systems  Constitutional:  Negative for chills and fever.  Respiratory:  Negative for cough and shortness of breath.   Cardiovascular:  Negative for chest pain.  Gastrointestinal:  Negative for abdominal pain, diarrhea, nausea and vomiting.  Neurological:  Negative for dizziness and headaches.      Objective:    Physical Exam Constitutional:      Appearance: Normal appearance.  HENT:     Head: Normocephalic and atraumatic.     Mouth/Throat:     Mouth: Mucous membranes are moist.     Pharynx: Oropharynx is clear.  Eyes:     Conjunctiva/sclera: Conjunctivae normal.  Cardiovascular:     Rate and Rhythm: Normal rate and regular rhythm.  Pulmonary:     Effort: Pulmonary effort is normal.     Breath sounds: Normal breath sounds.  Musculoskeletal:     Right lower leg: No edema.     Left lower leg: No edema.  Skin:    General: Skin is warm and dry.  Neurological:     General: No focal  deficit present.     Mental Status: She is alert. Mental status is at baseline.  Psychiatric:        Mood and Affect: Mood normal.        Behavior: Behavior normal.     There were no vitals taken for this visit. Wt Readings from Last 3 Encounters:  07/02/22 152 lb 3.2 oz (69 kg)  03/08/22 151 lb 4.8 oz (68.6 kg)  12/15/21 154 lb (69.9 kg)     Health Maintenance Due  Topic Date Due   INFLUENZA VACCINE  03/06/2022   COLONOSCOPY (Pts 45-26yr Insurance coverage will need to be confirmed)  06/14/2022    There are no preventive care reminders to display for this patient.  No results found for: "TSH" Lab Results  Component Value Date   WBC 6.4 12/15/2020   HGB 13.3 12/15/2020   HCT 39.6 12/15/2020   MCV 91.5 12/15/2020   PLT 260 12/15/2020   Lab Results  Component Value Date   NA 136 12/15/2020   K 4.1 12/15/2020   CO2 23 12/15/2020   GLUCOSE 124 (H) 12/15/2020   BUN 13 12/15/2020   CREATININE 0.76 12/15/2020   BILITOT 0.8 12/15/2020   ALKPHOS 43 12/15/2020   AST 20 12/15/2020   ALT 19 12/15/2020   PROT 7.0 12/15/2020   ALBUMIN 3.9 12/15/2020   CALCIUM 9.0 12/15/2020   ANIONGAP 6 12/15/2020   Lab Results  Component Value Date   CHOL 183 01/16/2021   Lab Results  Component Value Date   HDL  44 (L) 01/16/2021   Lab Results  Component Value Date   LDLCALC 110 (H) 01/16/2021   Lab Results  Component Value Date   TRIG 174 (H) 01/16/2021   Lab Results  Component Value Date   CHOLHDL 4.2 01/16/2021   Lab Results  Component Value Date   HGBA1C 5.3 03/11/2017      Assessment & Plan:   1. Benign essential HTN: Blood pressure borderline today, continue Metoprolol 50 mg XL. Following with Cardiology, note reviewed from 08/21/21. Continue to monitor. Due for annual screening labs.   - CBC w/Diff/Platelet - COMPLETE METABOLIC PANEL WITH GFR  2. Hyperlipidemia LDL goal <100: Check lipid panel tomorrow when she is fasting. Continue Lipitor 10 mg 3x weekly.  Refilled today.   - Lipid Profile - atorvastatin (LIPITOR) 10 MG tablet; One by mouth at bedtime three nights a week  Dispense: 36 tablet; Refill: 3  3. MCI (mild cognitive impairment): Zoloft 50 mg refilled today.   - sertraline (ZOLOFT) 50 MG tablet; Take 1 tablet (50 mg total) by mouth daily.  Dispense: 90 tablet; Refill: 1   No orders of the defined types were placed in this encounter.   Follow-up: No follow-ups on file.    Teodora Medici, DO

## 2022-08-09 ENCOUNTER — Ambulatory Visit: Payer: Medicare PPO | Admitting: Internal Medicine

## 2022-09-10 DIAGNOSIS — I10 Essential (primary) hypertension: Secondary | ICD-10-CM | POA: Diagnosis not present

## 2022-09-10 DIAGNOSIS — E782 Mixed hyperlipidemia: Secondary | ICD-10-CM | POA: Diagnosis not present

## 2022-09-10 DIAGNOSIS — I4719 Other supraventricular tachycardia: Secondary | ICD-10-CM | POA: Diagnosis not present

## 2022-09-10 DIAGNOSIS — I071 Rheumatic tricuspid insufficiency: Secondary | ICD-10-CM | POA: Diagnosis not present

## 2022-10-24 ENCOUNTER — Telehealth: Payer: Self-pay | Admitting: Internal Medicine

## 2022-10-24 NOTE — Telephone Encounter (Signed)
Called patient to schedule Medicare Annual Wellness Visit (AWV). No voicemail available to leave a message.  Re-schedule AWV on: 12/04/2022  Please schedule an appointment at any time with [facility]  If any questions, please contact me at [phone].  Thank you ,  [place] Direct Dial: [phone]

## 2022-11-13 ENCOUNTER — Telehealth: Payer: Self-pay | Admitting: Internal Medicine

## 2022-11-13 NOTE — Telephone Encounter (Signed)
Contacted Toni Callahan to schedule their annual wellness visit. Appointment made for 12/04/2022.  [facility] Care Guide [facility] AWV TEAM Direct Dial: [phone]

## 2022-12-14 ENCOUNTER — Ambulatory Visit (INDEPENDENT_AMBULATORY_CARE_PROVIDER_SITE_OTHER): Payer: Medicare PPO

## 2022-12-14 VITALS — Ht 65.0 in | Wt 152.0 lb

## 2022-12-14 DIAGNOSIS — Z0000 Encounter for general adult medical examination without abnormal findings: Secondary | ICD-10-CM | POA: Diagnosis not present

## 2022-12-14 NOTE — Progress Notes (Signed)
I connected with  Arnetha Massy on 12/14/22 by a audio enabled telemedicine application and verified that I am speaking with the correct person using two identifiers.  Patient Location: Home  Provider Location: Office/Clinic  I discussed the limitations of evaluation and management by telemedicine. The patient expressed understanding and agreed to proceed.  Subjective:   Toni Callahan is a 82 y.o. female who presents for Medicare Annual (Subsequent) preventive examination.  Review of Systems    Cardiac Risk Factors include: advanced age (>43men, >39 women);dyslipidemia;hypertension     Objective:    Today's Vitals   12/14/22 1151  Weight: 152 lb (68.9 kg)  Height: 5\' 5"  (1.651 m)   Body mass index is 25.29 kg/m.     12/14/2022   12:01 PM 12/15/2021    2:00 PM 11/28/2021   11:20 AM 12/15/2020    8:48 AM 11/24/2020    1:39 PM 11/03/2019    9:32 AM 06/15/2019    8:59 AM  Advanced Directives  Does Patient Have a Medical Advance Directive? Yes Yes Yes Yes Yes Yes Yes  Type of Special educational needs teacher of [place];Living will Healthcare Power of [place];Living will Healthcare Power of [place];Living will Healthcare Power of [place];Living will Healthcare Power of [place];Living will Living will  Does patient want to make changes to medical advance directive?    No - Patient declined     Copy of Healthcare Power of Attorney in Chart?   No - copy requested No - copy requested No - copy requested No - copy requested     Current Medications (verified) Outpatient Encounter Medications as of 12/14/2022  Medication Sig   Apoaequorin (PREVAGEN PO) Take by mouth.   aspirin 81 MG tablet Take 81 mg by mouth daily.   atorvastatin (LIPITOR) 10 MG tablet One by mouth at bedtime three nights a week   Biotin 5000 MCG CAPS Take 1 capsule by mouth daily.   Calcium Carbonate (CALCIUM 600 PO) Take 1 tablet by mouth every other day.    Cholecalciferol (VITAMIN D PO) Take 1,000  Int'l Units by mouth daily.   Cyanocobalamin (VITAMIN B 12 PO) Take 500 mg by mouth daily.   folic acid (FOLVITE) 400 MCG tablet Take 400 mcg by mouth daily.   Magnesium 250 MG TABS Take 1 tablet by mouth daily.   metoprolol succinate (TOPROL-XL) 50 MG 24 hr tablet Take 1 tablet (50 mg total) by mouth daily.   Multiple Vitamin (MULTIVITAMIN) tablet Take 1 tablet by mouth daily.   Omega-3 Fatty Acids (FISH OIL) 1000 MG CAPS Take 1 capsule by mouth every other day.    Potassium Gluconate 595 MG CAPS Take by mouth daily.   sertraline (ZOLOFT) 50 MG tablet Take 1 tablet (50 mg total) by mouth daily.   vitamin C (ASCORBIC ACID) 500 MG tablet Take 500 mg by mouth every other day.    zinc gluconate 50 MG tablet Take 50 mg by mouth daily.   No facility-administered encounter medications on file as of 12/14/2022.    Allergies (verified) Sulfa antibiotics   History: Past Medical History:  Diagnosis Date   Allergy    Breast cancer (HCC) 2009   left /chemo/rad   Cancer (HCC) 2009   left breast, T1, N0, M0. ER/PR positive, HER2 neu positive   Dental bridge present    permanant - upper   GERD (gastroesophageal reflux disease)    Hyperlipidemia    Personal history of chemotherapy    Personal history  of radiation therapy    Personal history of tobacco use, presenting hazards to health    SVT (supraventricular tachycardia)    Tachycardia    Tubular adenoma of colon    Past Surgical History:  Procedure Laterality Date   BREAST BIOPSY Left 2009   positive   BREAST CYST ASPIRATION Bilateral 2005   neg   BREAST LUMPECTOMY Left 2009   positive   BREAST SURGERY Left 2009   lumpectomy   CATARACT EXTRACTION W/ INTRAOCULAR LENS IMPLANT Bilateral    COLONOSCOPY  [id number]   Dr. Bluford Kaufmann   COLONOSCOPY N/A 09/07/2015   Procedure: COLONOSCOPY;  Surgeon: Wallace Cullens, MD;  Location: [facility];  Service: Gastroenterology;  Laterality: N/A;   COLONOSCOPY WITH PROPOFOL N/A 06/15/2019   Procedure:  COLONOSCOPY WITH PROPOFOL;  Surgeon: Toledo, Boykin Nearing, MD;  Location: [facility];  Service: Endoscopy;  Laterality: N/A;   POLYPECTOMY  09/07/2015   Procedure: POLYPECTOMY;  Surgeon: Wallace Cullens, MD;  Location: [facility];  Service: Gastroenterology;;   [facility] REMOVAL  2011   PORTACATH PLACEMENT  2009   TONSILLECTOMY     TONSILLECTOMY     Family History  Problem Relation Age of Onset   Stroke Mother    Heart disease Father    Diabetes Brother    Heart disease Paternal Grandmother    Cancer Neg Hx    COPD Neg Hx    Hypertension Neg Hx    Breast cancer Neg Hx    Social History   Socioeconomic History   Marital status: Married    Spouse name: Not on file   Number of children: 0   Years of education: Not on file   Highest education level: Bachelor's degree (e.g., BA, AB, BS)  Occupational History   Not on file  Tobacco Use   Smoking status: Former    Years: 20    Types: Cigarettes    Quit date: 08/06/1984    Years since quitting: 38.3   Smokeless tobacco: Never   Tobacco comments:    quit in 1986 approximately  Vaping Use   Vaping Use: Never used  Substance and Sexual Activity   Alcohol use: Yes    Comment: drinks rarely   Drug use: No   Sexual activity: Yes  Other Topics Concern   Not on file  Social History Narrative   Not on file   Social Determinants of Health   Financial Resource Strain: Low Risk  (12/14/2022)   Overall Financial Resource Strain (CARDIA)    Difficulty of Paying Living Expenses: Not hard at all  Food Insecurity: No Food Insecurity (12/14/2022)   Hunger Vital Sign    Worried About Running Out of Food in the Last Year: Never true    Ran Out of Food in the Last Year: Never true  Transportation Needs: No Transportation Needs (12/14/2022)   PRAPARE - Administrator, Civil Service (Medical): No    Lack of Transportation (Non-Medical): No  Physical Activity: Sufficiently Active (12/14/2022)   Exercise Vital Sign    Days of  Exercise per Week: 7 days    Minutes of Exercise per Session: 30 min  Stress: No Stress Concern Present (12/14/2022)   [employer] of Occupational Health - Occupational Stress Questionnaire    Feeling of Stress : Not at all  Social Connections: Moderately Integrated (12/14/2022)   Social Connection and Isolation Panel [NHANES]    Frequency of Communication with Friends and Family: More than three  times a week    Frequency of Social Gatherings with Friends and Family: Three times a week    Attends Religious Services: More than 4 times per year    Active Member of Clubs or Organizations: No    Attends Banker Meetings: Never    Marital Status: Married    Tobacco Counseling Counseling given: Not Answered Tobacco comments: quit in 1986 approximately   Clinical Intake:  Pre-visit preparation completed: Yes  Pain : No/denies pain     BMI - recorded: 25.29 Nutritional Status: BMI 25 -29 Overweight Nutritional Risks: None Diabetes: No  How often do you need to have someone help you when you read instructions, pamphlets, or other written materials from your doctor or pharmacy?: 1 - Never  Diabetic?no  Interpreter Needed?: No  Comments: lives w/husband Information entered by :: B.Chieko Neises,LPN   Activities of Daily Living    12/14/2022   12:01 PM 03/08/2022    1:06 PM  In your present state of health, do you have any difficulty performing the following activities:  Hearing? 0 0  Vision? 0 0  Difficulty concentrating or making decisions? 0 0  Walking or climbing stairs? 0 0  Dressing or bathing? 0 0  Doing errands, shopping? 0 0  Preparing Food and eating ? N   Using the Toilet? N   In the past six months, have you accidently leaked urine? N   Do you have problems with loss of bowel control? N   Managing your Medications? N   Managing your Finances? N   Housekeeping or managing your Housekeeping? N     Patient Care Team: Margarita Mail, DO as PCP  - General (Internal Medicine) Lamar Blinks, MD as Consulting Physician (Cardiology) Stanton Kidney, MD as Consulting Physician (Gastroenterology)  Indicate any recent Medical Services you may have received from other than [facility] providers in the past year (date may be approximate).     Assessment:   This is a routine wellness examination for [place].  Hearing/Vision screen Hearing Screening - Comments:: Adequate hearing Vision Screening - Comments:: Adequate vision w/glasses Dr Gardiner Fanti  Dietary issues and exercise activities discussed: Current Exercise Habits: Home exercise routine, Type of exercise: walking, Time (Minutes): 30, Frequency (Times/Week): 7, Weekly Exercise (Minutes/Week): 210, Intensity: Mild, Exercise limited by: None identified   Goals Addressed               This Visit's Progress     DIET - EAT MORE FRUITS AND VEGETABLES (pt-stated)   On track     Five a day       Depression Screen    12/14/2022   11:56 AM 03/08/2022    1:06 PM 11/28/2021   11:19 AM 09/08/2021    1:28 PM 01/16/2021   10:05 AM 11/24/2020    1:38 PM 08/04/2020    2:24 PM  PHQ 2/9 Scores  PHQ - 2 Score 0 0 0 0 0 0 0  PHQ- 9 Score  0  0 0      Fall Risk    12/14/2022   11:53 AM 03/08/2022    1:06 PM 11/28/2021   11:21 AM 09/08/2021    1:27 PM 01/16/2021   10:05 AM  Fall Risk   Falls in the past year? 0 0 0 0 0  Number falls in past yr: 0 0 0 0 0  Injury with Fall? 0 0 0 0 0  Risk for fall due to : No Fall Risks No  Fall Risks No Fall Risks    Follow up Education provided;Falls prevention discussed Falls prevention discussed;Education provided Falls prevention discussed      FALL RISK PREVENTION PERTAINING TO THE HOME:  Any stairs in or around the home? Yes  If so, are there any without handrails? Yes  Home free of loose throw rugs in walkways, pet beds, electrical cords, etc? Yes  Adequate lighting in your home to reduce risk of falls? Yes   ASSISTIVE DEVICES UTILIZED TO  PREVENT FALLS:  Life alert? No  Use of a cane, walker or w/c? Yes when walking Grab bars in the bathroom? Yes  Shower chair or bench in shower? Yes  Elevated toilet seat or a handicapped toilet? No    Cognitive Function:        12/14/2022   12:00 PM 11/24/2020    1:43 PM 11/03/2019    9:37 AM 09/18/2018    8:33 AM 09/11/2017    9:08 AM  6CIT Screen  What Year? 0 points 0 points 0 points 0 points 0 points  What month? 0 points 0 points 0 points 0 points 0 points  What time? 0 points 0 points 0 points 0 points 0 points  Count back from 20 0 points 0 points 0 points 0 points 0 points  Months in reverse 4 points 2 points 0 points 0 points 4 points  Repeat phrase 0 points 6 points 0 points 0 points 0 points  Total Score 4 points 8 points 0 points 0 points 4 points    Immunizations Immunization History  Administered Date(s) Administered   Covid-19, Mrna,Vaccine(Spikevax)44yrs and older 06/19/2022   Fluad Quad(high Dose 65+) 04/24/2019, 05/08/2021   Influenza Split 04/21/2013   Influenza,inj,quad, With Preservative 04/23/2014, 05/02/2018   Influenza-Unspecified 04/09/2015, 04/11/2016, 05/02/2018, 04/19/2020   Moderna Sars-Covid-2 Vaccination 08/18/2019, 09/15/2019, 06/18/2020   Pneumococcal Conjugate-13 03/02/2014   Pneumococcal Polysaccharide-23 05/01/2005   Respiratory Syncytial Virus Vaccine,Recomb Aduvanted(Arexvy) 06/07/2022   Td 08/07/2003   Tdap 08/25/2013   Zoster Recombinat (Shingrix) 06/07/2017, 09/09/2017   Zoster, Live 05/16/2006    TDAP status: Up to date  Flu Vaccine status: Up to date  Pneumococcal vaccine status: Up to date  Covid-19 vaccine status: Completed vaccines  Qualifies for Shingles Vaccine? Yes   Zostavax completed Yes   Shingrix Completed?: Yes  Screening Tests Health Maintenance  Topic Date Due   COLONOSCOPY (Pts 45-35yrs Insurance coverage will need to be confirmed)  06/14/2022   COVID-19 Vaccine (5 - 2023-24 season) 08/14/2022   DEXA  SCAN  09/01/2022   INFLUENZA VACCINE  03/07/2023   MAMMOGRAM  06/21/2023   DTaP/Tdap/Td (3 - Td or Tdap) 08/26/2023   Medicare Annual Wellness (AWV)  12/14/2023   Pneumonia Vaccine 75+ Years old  Completed   Zoster Vaccines- Shingrix  Completed   HPV VACCINES  Aged Out    Health Maintenance  Health Maintenance Due  Topic Date Due   COLONOSCOPY (Pts 45-70yrs Insurance coverage will need to be confirmed)  06/14/2022   COVID-19 Vaccine (5 - 2023-24 season) 08/14/2022   DEXA SCAN  09/01/2022    Colorectal cancer screening: No longer required.   Mammogram status: No longer required due to age.  Bone Density status: Completed yes. Results reflect: Bone density results: NORMAL. Repeat every 5 years.  Lung Cancer Screening: (Low Dose CT Chest recommended if Age 31-80 years, 30 pack-year currently smoking OR have quit w/in 15years.) does not qualify.   Lung Cancer Screening Referral: no  Additional Screening:  Hepatitis C Screening: does not qualify; Completed yes  Vision Screening: Recommended annual ophthalmology exams for early detection of glaucoma and other disorders of the eye. Is the patient up to date with their annual eye exam?  Yes  Who is the provider or what is the name of the office in which the patient attends annual eye exams? Dr Beverely Pace If pt is not established with a provider, would they like to be referred to a provider to establish care? No .   Dental Screening: Recommended annual dental exams for proper oral hygiene  Community Resource Referral / Chronic Care Management: CRR required this visit?  No   CCM required this visit?  No    Plan:     I have personally reviewed and noted the following in the patient's chart:   Medical and social history Use of alcohol, tobacco or illicit drugs  Current medications and supplements including opioid prescriptions. Patient is not currently taking opioid prescriptions. Functional ability and status Nutritional  status Physical activity Advanced directives List of other physicians Hospitalizations, surgeries, and ER visits in previous 12 months Vitals Screenings to include cognitive, depression, and falls Referrals and appointments  In addition, I have reviewed and discussed with patient certain preventive protocols, quality metrics, and best practice recommendations. A written personalized care plan for preventive services as well as general preventive health recommendations were provided to patient.     Sue Lush, LPN   1/61/0960   Nurse Notes: The patient states she is doing well and has no concerns or questions at this time. 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

## 2022-12-14 NOTE — Patient Instructions (Signed)
Toni Callahan , Thank you for taking time to come for your Medicare Wellness Visit. I appreciate your ongoing commitment to your health goals. Please review the following plan we discussed and let me know if I can assist you in the future.   These are the goals we discussed:  Goals       DIET - EAT MORE FRUITS AND VEGETABLES (pt-stated)      Five a day        This is a list of the screening recommended for you and due dates:  Health Maintenance  Topic Date Due   Colon Cancer Screening  06/14/2022   COVID-19 Vaccine (5 - 2023-24 season) 08/14/2022   DEXA scan (bone density measurement)  09/01/2022   Flu Shot  03/07/2023   Mammogram  06/21/2023   DTaP/Tdap/Td vaccine (3 - Td or Tdap) 08/26/2023   Medicare Annual Wellness Visit  12/14/2023   Pneumonia Vaccine  Completed   Zoster (Shingles) Vaccine  Completed   HPV Vaccine  Aged Out    Advanced directives: yes  Conditions/risks identified: low falls risk  Next appointment: Follow up in one year for your annual wellness visit 12/20/2023 @8 :45am telephone   Preventive Care 65 Years and Older, Female Preventive care refers to lifestyle choices and visits with your health care provider that can promote health and wellness. What does preventive care include? A yearly physical exam. This is also called an annual well check. Dental exams once or twice a year. Routine eye exams. Ask your health care provider how often you should have your eyes checked. Personal lifestyle choices, including: Daily care of your teeth and gums. Regular physical activity. Eating a healthy diet. Avoiding tobacco and drug use. Limiting alcohol use. Practicing safe sex. Taking low-dose aspirin every day. Taking vitamin and mineral supplements as recommended by your health care provider. What happens during an annual well check? The services and screenings done by your health care provider during your annual well check will depend on your age, overall  health, lifestyle risk factors, and family history of disease. Counseling  Your health care provider may ask you questions about your: Alcohol use. Tobacco use. Drug use. Emotional well-being. Home and relationship well-being. Sexual activity. Eating habits. History of falls. Memory and ability to understand (cognition). Work and work Astronomer. Reproductive health. Screening  You may have the following tests or measurements: Height, weight, and BMI. Blood pressure. Lipid and cholesterol levels. These may be checked every 5 years, or more frequently if you are over 65 years old. Skin check. Lung cancer screening. You may have this screening every year starting at age 40 if you have a 30-pack-year history of smoking and currently smoke or have quit within the past 15 years. Fecal occult blood test (FOBT) of the stool. You may have this test every year starting at age 44. Flexible sigmoidoscopy or colonoscopy. You may have a sigmoidoscopy every 5 years or a colonoscopy every 10 years starting at age 33. Hepatitis C blood test. Hepatitis B blood test. Sexually transmitted disease (STD) testing. Diabetes screening. This is done by checking your blood sugar (glucose) after you have not eaten for a while (fasting). You may have this done every 1-3 years. Bone density scan. This is done to screen for osteoporosis. You may have this done starting at age 61. Mammogram. This may be done every 1-2 years. Talk to your health care provider about how often you should have regular mammograms. Talk with your health care provider  about your test results, treatment options, and if necessary, the need for more tests. Vaccines  Your health care provider may recommend certain vaccines, such as: Influenza vaccine. This is recommended every year. Tetanus, diphtheria, and acellular pertussis (Tdap, Td) vaccine. You may need a Td booster every 10 years. Zoster vaccine. You may need this after age  61. Pneumococcal 13-valent conjugate (PCV13) vaccine. One dose is recommended after age 18. Pneumococcal polysaccharide (PPSV23) vaccine. One dose is recommended after age 22. Talk to your health care provider about which screenings and vaccines you need and how often you need them. This information is not intended to replace advice given to you by your health care provider. Make sure you discuss any questions you have with your health care provider. Document Released: 08/19/2015 Document Revised: 04/11/2016 Document Reviewed: 05/24/2015 Elsevier Interactive Patient Education  2017 [place] Prevention in the Home Falls can cause injuries. They can happen to people of all ages. There are many things you can do to make your home safe and to help prevent falls. What can I do on the outside of my home? Regularly fix the edges of walkways and driveways and fix any cracks. Remove anything that might make you trip as you walk through a door, such as a raised step or threshold. Trim any bushes or trees on the path to your home. Use bright outdoor lighting. Clear any walking paths of anything that might make someone trip, such as rocks or tools. Regularly check to see if handrails are loose or broken. Make sure that both sides of any steps have handrails. Any raised decks and porches should have guardrails on the edges. Have any leaves, snow, or ice cleared regularly. Use sand or salt on walking paths during winter. Clean up any spills in your garage right away. This includes oil or grease spills. What can I do in the bathroom? Use night lights. Install grab bars by the toilet and in the tub and shower. Do not use towel bars as grab bars. Use non-skid mats or decals in the tub or shower. If you need to sit down in the shower, use a plastic, non-slip stool. Keep the floor dry. Clean up any water that spills on the floor as soon as it happens. Remove soap buildup in the tub or shower  regularly. Attach bath mats securely with double-sided non-slip rug tape. Do not have throw rugs and other things on the floor that can make you trip. What can I do in the bedroom? Use night lights. Make sure that you have a light by your bed that is easy to reach. Do not use any sheets or blankets that are too big for your bed. They should not hang down onto the floor. Have a firm chair that has side arms. You can use this for support while you get dressed. Do not have throw rugs and other things on the floor that can make you trip. What can I do in the kitchen? Clean up any spills right away. Avoid walking on wet floors. Keep items that you use a lot in easy-to-reach places. If you need to reach something above you, use a strong step stool that has a grab bar. Keep electrical cords out of the way. Do not use floor polish or wax that makes floors slippery. If you must use wax, use non-skid floor wax. Do not have throw rugs and other things on the floor that can make you trip. What can I do  with my stairs? Do not leave any items on the stairs. Make sure that there are handrails on both sides of the stairs and use them. Fix handrails that are broken or loose. Make sure that handrails are as long as the stairways. Check any carpeting to make sure that it is firmly attached to the stairs. Fix any carpet that is loose or worn. Avoid having throw rugs at the top or bottom of the stairs. If you do have throw rugs, attach them to the floor with carpet tape. Make sure that you have a light switch at the top of the stairs and the bottom of the stairs. If you do not have them, ask someone to add them for you. What else can I do to help prevent falls? Wear shoes that: Do not have high heels. Have rubber bottoms. Are comfortable and fit you well. Are closed at the toe. Do not wear sandals. If you use a stepladder: Make sure that it is fully opened. Do not climb a closed stepladder. Make sure that  both sides of the stepladder are locked into place. Ask someone to hold it for you, if possible. Clearly mark and make sure that you can see: Any grab bars or handrails. First and last steps. Where the edge of each step is. Use tools that help you move around (mobility aids) if they are needed. These include: Canes. Walkers. Scooters. Crutches. Turn on the lights when you go into a dark area. Replace any light bulbs as soon as they burn out. Set up your furniture so you have a clear path. Avoid moving your furniture around. If any of your floors are uneven, fix them. If there are any pets around you, be aware of where they are. Review your medicines with your doctor. Some medicines can make you feel dizzy. This can increase your chance of falling. Ask your doctor what other things that you can do to help prevent falls. This information is not intended to replace advice given to you by your health care provider. Make sure you discuss any questions you have with your health care provider. Document Released: 05/19/2009 Document Revised: 12/29/2015 Document Reviewed: 08/27/2014 Elsevier Interactive Patient Education  2017 [employer].

## 2022-12-17 ENCOUNTER — Inpatient Hospital Stay: Payer: Medicare PPO

## 2022-12-17 ENCOUNTER — Inpatient Hospital Stay: Payer: Medicare PPO | Admitting: Internal Medicine

## 2023-01-02 ENCOUNTER — Other Ambulatory Visit: Payer: Self-pay | Admitting: *Deleted

## 2023-01-02 DIAGNOSIS — Z853 Personal history of malignant neoplasm of breast: Secondary | ICD-10-CM

## 2023-01-04 ENCOUNTER — Inpatient Hospital Stay: Payer: Medicare PPO

## 2023-01-04 ENCOUNTER — Encounter: Payer: Self-pay | Admitting: Internal Medicine

## 2023-01-04 ENCOUNTER — Inpatient Hospital Stay: Payer: Medicare PPO | Attending: Internal Medicine | Admitting: Internal Medicine

## 2023-01-04 VITALS — BP 156/80 | HR 82 | Temp 97.4°F | Ht 65.0 in | Wt 149.2 lb

## 2023-01-04 DIAGNOSIS — M810 Age-related osteoporosis without current pathological fracture: Secondary | ICD-10-CM | POA: Diagnosis not present

## 2023-01-04 DIAGNOSIS — Z87891 Personal history of nicotine dependence: Secondary | ICD-10-CM | POA: Diagnosis not present

## 2023-01-04 DIAGNOSIS — Z853 Personal history of malignant neoplasm of breast: Secondary | ICD-10-CM

## 2023-01-04 DIAGNOSIS — R944 Abnormal results of kidney function studies: Secondary | ICD-10-CM | POA: Diagnosis not present

## 2023-01-04 DIAGNOSIS — Z08 Encounter for follow-up examination after completed treatment for malignant neoplasm: Secondary | ICD-10-CM | POA: Diagnosis not present

## 2023-01-04 LAB — CBC WITH DIFFERENTIAL (CANCER CENTER ONLY)
Abs Immature Granulocytes: 0.02 10*3/uL (ref 0.00–0.07)
Basophils Absolute: 0 10*3/uL (ref 0.0–0.1)
Basophils Relative: 0 %
Eosinophils Absolute: 0 10*3/uL (ref 0.0–0.5)
Eosinophils Relative: 0 %
HCT: 39.9 % (ref 36.0–46.0)
Hemoglobin: 13.1 g/dL (ref 12.0–15.0)
Immature Granulocytes: 0 %
Lymphocytes Relative: 39 %
Lymphs Abs: 2.8 10*3/uL (ref 0.7–4.0)
MCH: 30.5 pg (ref 26.0–34.0)
MCHC: 32.8 g/dL (ref 30.0–36.0)
MCV: 93 fL (ref 80.0–100.0)
Monocytes Absolute: 0.5 10*3/uL (ref 0.1–1.0)
Monocytes Relative: 8 %
Neutro Abs: 3.7 10*3/uL (ref 1.7–7.7)
Neutrophils Relative %: 53 %
Platelet Count: 233 10*3/uL (ref 150–400)
RBC: 4.29 MIL/uL (ref 3.87–5.11)
RDW: 12.3 % (ref 11.5–15.5)
WBC Count: 7.1 10*3/uL (ref 4.0–10.5)
nRBC: 0 % (ref 0.0–0.2)

## 2023-01-04 LAB — CMP (CANCER CENTER ONLY)
ALT: 18 U/L (ref 0–44)
AST: 19 U/L (ref 15–41)
Albumin: 4 g/dL (ref 3.5–5.0)
Alkaline Phosphatase: 48 U/L (ref 38–126)
Anion gap: 9 (ref 5–15)
BUN: 22 mg/dL (ref 8–23)
CO2: 25 mmol/L (ref 22–32)
Calcium: 8.8 mg/dL — ABNORMAL LOW (ref 8.9–10.3)
Chloride: 103 mmol/L (ref 98–111)
Creatinine: 1.21 mg/dL — ABNORMAL HIGH (ref 0.44–1.00)
GFR, Estimated: 44 mL/min — ABNORMAL LOW (ref >60)
Glucose, Bld: 88 mg/dL (ref 70–99)
Potassium: 4.1 mmol/L (ref 3.5–5.1)
Sodium: 137 mmol/L (ref 135–145)
Total Bilirubin: 0.4 mg/dL (ref 0.3–1.2)
Total Protein: 7.2 g/dL (ref 6.5–8.1)

## 2023-01-04 NOTE — Assessment & Plan Note (Signed)
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 Mammo- Bil Nov 2023- WNL.  Stable.    # Osteoporosis- On fosamax per pcp. PCP to manage bone density screenings, calcium and vitamin d. Stable.   # Elevated Creatinine- 1.26/GFR- 44; recommend hydration/pt not on any NSIADs.    # DISPOSITION:  # Bil screening Mammogram 06/2023 # Follow up in 12 months; MD; labs- cbc/cmp-Dr.B

## 2023-01-04 NOTE — Addendum Note (Signed)
Addended by: Darrold Span A on: 01/04/2023 03:07 PM   Modules accepted: Orders

## 2023-01-04 NOTE — Progress Notes (Signed)
[facility] CONSULT NOTE  Patient Care Team: Margarita Mail, DO as PCP - General (Internal Medicine) Lamar Blinks, MD as Consulting Physician (Cardiology) Stanton Kidney, MD as Consulting Physician (Gastroenterology)  CHIEF COMPLAINTS/PURPOSE OF CONSULTATION: Breast cancer  Oncology History   No history exists.    HISTORY OF PRESENTING ILLNESS: Alone.  Ambulating independently.  Toni Callahan 82 y.o.  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  Appetite is  with no weight loss no nausea no vomiting.   Review of Systems  Constitutional:  Negative for chills, diaphoresis, fever, malaise/fatigue and weight loss.  HENT:  Negative for nosebleeds and sore throat.   Eyes:  Negative for double vision.  Respiratory:  Negative for cough, hemoptysis, sputum production, shortness of breath and wheezing.   Cardiovascular:  Negative for chest pain, palpitations, orthopnea and leg swelling.  Gastrointestinal:  Negative for abdominal pain, blood in stool, constipation, diarrhea, heartburn, melena, nausea and vomiting.  Genitourinary:  Negative for dysuria, frequency and urgency.  Musculoskeletal:  Negative for back pain and joint pain.  Skin: Negative.  Negative for itching and rash.  Neurological:  Negative for dizziness, tingling, focal weakness, weakness and headaches.  Endo/Heme/Allergies:  Does not bruise/bleed easily.  Psychiatric/Behavioral:  Negative for depression. The patient is not nervous/anxious and does not have insomnia.       MEDICAL HISTORY:  Past Medical History:  Diagnosis Date   Allergy    Breast cancer (HCC) 2009   left /chemo/rad   Cancer (HCC) 2009   left breast, T1, N0, M0. ER/PR positive, HER2 neu positive   Dental bridge present    permanant - upper   GERD (gastroesophageal reflux disease)     Hyperlipidemia    Personal history of chemotherapy    Personal history of radiation therapy    Personal history of tobacco use, presenting hazards to health    SVT (supraventricular tachycardia)    Tachycardia    Tubular adenoma of colon     SURGICAL HISTORY: Past Surgical History:  Procedure Laterality Date   BREAST BIOPSY Left 2009   positive   BREAST CYST ASPIRATION Bilateral 2005   neg   BREAST LUMPECTOMY Left 2009   positive   BREAST SURGERY Left 2009   lumpectomy   CATARACT EXTRACTION W/ INTRAOCULAR LENS IMPLANT Bilateral    COLONOSCOPY  [id number]   Dr. Bluford Kaufmann   COLONOSCOPY N/A 09/07/2015   Procedure: COLONOSCOPY;  Surgeon: Wallace Cullens, MD;  Location: [facility];  Service: Gastroenterology;  Laterality: N/A;   COLONOSCOPY WITH PROPOFOL N/A 06/15/2019   Procedure: COLONOSCOPY WITH PROPOFOL;  Surgeon: Toledo, Boykin Nearing, MD;  Location: [facility] END[facility];  Service: Endoscopy;  Laterality: N/A;   POLYPECTOMY  09/07/2015   Procedure: POLYPECTOMY;  Surgeon: Wallace Cullens, MD;  Location: [facility];  Service: Gastroenterology;;   [facility] REMOVAL  2011   PORTACATH PLACEMENT  2009   TONSILLECTOMY     TONSILLECTOMY      SOCIAL HISTORY: Social History   Socioeconomic History   Marital status: Married    Spouse name: Not on file   Number of children: 0   Years of education: Not on file   Highest education level: Bachelor's degree (e.g., BA, AB, BS)  Occupational History   Not on file  Tobacco Use   Smoking status: Former    Years:  20    Types: Cigarettes    Quit date: 08/06/1984    Years since quitting: 38.4   Smokeless tobacco: Never   Tobacco comments:    quit in 1986 approximately  Vaping Use   Vaping Use: Never used  Substance and Sexual Activity   Alcohol use: Yes    Comment: drinks rarely   Drug use: No   Sexual activity: Yes  Other Topics Concern   Not on file  Social History Narrative   Not on file   Social Determinants of Health   Financial  Resource Strain: Low Risk  (12/14/2022)   Overall Financial Resource Strain (CARDIA)    Difficulty of Paying Living Expenses: Not hard at all  Food Insecurity: No Food Insecurity (12/14/2022)   Hunger Vital Sign    Worried About Running Out of Food in the Last Year: Never true    Ran Out of Food in the Last Year: Never true  Transportation Needs: No Transportation Needs (12/14/2022)   PRAPARE - Administrator, Civil Service (Medical): No    Lack of Transportation (Non-Medical): No  Physical Activity: Sufficiently Active (12/14/2022)   Exercise Vital Sign    Days of Exercise per Week: 7 days    Minutes of Exercise per Session: 30 min  Stress: No Stress Concern Present (12/14/2022)   [employer] of Occupational Health - Occupational Stress Questionnaire    Feeling of Stress : Not at all  Social Connections: Moderately Integrated (12/14/2022)   Social Connection and Isolation Panel [NHANES]    Frequency of Communication with Friends and Family: More than three times a week    Frequency of Social Gatherings with Friends and Family: Three times a week    Attends Religious Services: More than 4 times per year    Active Member of Clubs or Organizations: No    Attends Banker Meetings: Never    Marital Status: Married  Catering manager Violence: Not At Risk (12/14/2022)   Humiliation, Afraid, Rape, and Kick questionnaire    Fear of Current or Ex-Partner: No    Emotionally Abused: No    Physically Abused: No    Sexually Abused: No    FAMILY HISTORY: Family History  Problem Relation Age of Onset   Stroke Mother    Heart disease Father    Diabetes Brother    Heart disease Paternal Grandmother    Cancer Neg Hx    COPD Neg Hx    Hypertension Neg Hx    Breast cancer Neg Hx     ALLERGIES:  is allergic to sulfa antibiotics.  MEDICATIONS:  Current Outpatient Medications  Medication Sig Dispense Refill   Apoaequorin (PREVAGEN PO) Take by mouth.     aspirin 81  MG tablet Take 81 mg by mouth daily.     atorvastatin (LIPITOR) 10 MG tablet One by mouth at bedtime three nights a week 36 tablet 3   Biotin 5000 MCG CAPS Take 1 capsule by mouth daily.     Calcium Carbonate (CALCIUM 600 PO) Take 1 tablet by mouth every other day.      Cholecalciferol (VITAMIN D PO) Take 1,000 Int'l Units by mouth daily.     Cyanocobalamin (VITAMIN B 12 PO) Take 500 mg by mouth daily.     folic acid (FOLVITE) 400 MCG tablet Take 400 mcg by mouth daily.     Magnesium 250 MG TABS Take 1 tablet by mouth daily.     metoprolol succinate (TOPROL-XL) 50 MG  24 hr tablet Take 1 tablet (50 mg total) by mouth daily. 90 tablet 3   Multiple Vitamin (MULTIVITAMIN) tablet Take 1 tablet by mouth daily.     Omega-3 Fatty Acids (FISH OIL) 1000 MG CAPS Take 1 capsule by mouth every other day.      Potassium Gluconate 595 MG CAPS Take by mouth daily.     sertraline (ZOLOFT) 50 MG tablet Take 1 tablet (50 mg total) by mouth daily. 90 tablet 1   vitamin C (ASCORBIC ACID) 500 MG tablet Take 500 mg by mouth every other day.      zinc gluconate 50 MG tablet Take 50 mg by mouth daily.     No current facility-administered medications for this visit.      PHYSICAL EXAMINATION:   Vitals:   01/04/23 1404  BP: (!) 156/80  Pulse: 82  Temp: (!) 97.4 F (36.3 C)  SpO2: 99%   Filed Weights   01/04/23 1404  Weight: 149 lb 3.4 oz (67.7 kg)    Physical Exam Vitals and nursing note reviewed.  Constitutional:      Comments:     HENT:     Head: Normocephalic and atraumatic.     Mouth/Throat:     Mouth: Mucous membranes are moist.     Pharynx: Oropharynx is clear. No oropharyngeal exudate.  Eyes:     Extraocular Movements: Extraocular movements intact.     Pupils: Pupils are equal, round, and reactive to light.  Cardiovascular:     Rate and Rhythm: Normal rate and regular rhythm.  Pulmonary:     Effort: No respiratory distress.     Breath sounds: No wheezing.     Comments: Decreased  breath sounds bilaterally.  Abdominal:     General: Bowel sounds are normal. There is no distension.     Palpations: Abdomen is soft. There is no mass.     Tenderness: There is no abdominal tenderness. There is no guarding or rebound.  Musculoskeletal:        General: No tenderness. Normal range of motion.     Cervical back: Normal range of motion and neck supple.  Skin:    General: Skin is warm.  Neurological:     General: No focal deficit present.     Mental Status: She is alert and oriented to person, place, and time.  Psychiatric:        Mood and Affect: Affect normal.        Behavior: Behavior normal.        Judgment: Judgment normal.     LABORATORY DATA:  I have reviewed the data as listed Lab Results  Component Value Date   WBC 7.1 01/04/2023   HGB 13.1 01/04/2023   HCT 39.9 01/04/2023   MCV 93.0 01/04/2023   PLT 233 01/04/2023   Recent Labs    01/04/23 1409  NA 137  K 4.1  CL 103  CO2 25  GLUCOSE 88  BUN 22  CREATININE 1.21*  CALCIUM 8.8*  GFRNONAA 44*  PROT 7.2  ALBUMIN 4.0  AST 19  ALT 18  ALKPHOS 48  BILITOT 0.4    No results found.  History of breast cancer # Stage I ER positive Her2/neu+ left breast cancer - s/p lumpectomy, chemotherapy, and 10 years adjuvant femara completed in 2020. Mammo- Bil Nov 2023- WNL.  Stable.    # Osteoporosis- On fosamax per pcp. PCP to manage bone density screenings, calcium and vitamin d. Stable.   # Elevated Creatinine-  1.26/GFR- 44; recommend hydration/pt not on any NSIADs.    # DISPOSITION:  # Bil screening Mammogram 06/2023 # Follow up in 12 months; MD; labs- cbc/cmp-Dr.B  All questions were answered. The patient knows to call the clinic with any problems, questions or concerns.    Earna Coder, MD 01/04/2023 2:52 PM

## 2023-01-04 NOTE — Progress Notes (Signed)
No concerns today 

## 2023-02-22 DIAGNOSIS — H6121 Impacted cerumen, right ear: Secondary | ICD-10-CM | POA: Diagnosis not present

## 2023-02-22 DIAGNOSIS — H900 Conductive hearing loss, bilateral: Secondary | ICD-10-CM | POA: Diagnosis not present

## 2023-03-11 DIAGNOSIS — I10 Essential (primary) hypertension: Secondary | ICD-10-CM | POA: Diagnosis not present

## 2023-03-11 DIAGNOSIS — E782 Mixed hyperlipidemia: Secondary | ICD-10-CM | POA: Diagnosis not present

## 2023-03-11 DIAGNOSIS — I471 Supraventricular tachycardia, unspecified: Secondary | ICD-10-CM | POA: Diagnosis not present

## 2023-03-21 DIAGNOSIS — Z961 Presence of intraocular lens: Secondary | ICD-10-CM | POA: Diagnosis not present

## 2023-03-21 DIAGNOSIS — H353131 Nonexudative age-related macular degeneration, bilateral, early dry stage: Secondary | ICD-10-CM | POA: Diagnosis not present

## 2023-05-30 DIAGNOSIS — E782 Mixed hyperlipidemia: Secondary | ICD-10-CM | POA: Diagnosis not present

## 2023-06-24 ENCOUNTER — Ambulatory Visit
Admission: RE | Admit: 2023-06-24 | Discharge: 2023-06-24 | Disposition: A | Discharge status: Home / Self Care | Payer: Medicare PPO | Source: Ambulatory Visit | Attending: Internal Medicine | Admitting: Internal Medicine

## 2023-06-24 DIAGNOSIS — Z1231 Encounter for screening mammogram for malignant neoplasm of breast: Secondary | ICD-10-CM | POA: Insufficient documentation

## 2023-06-24 DIAGNOSIS — Z853 Personal history of malignant neoplasm of breast: Secondary | ICD-10-CM | POA: Diagnosis not present

## 2023-07-01 ENCOUNTER — Encounter: Payer: Self-pay | Admitting: Surgery

## 2023-07-01 ENCOUNTER — Ambulatory Visit: Payer: Medicare PPO | Admitting: Surgery

## 2023-07-01 VITALS — BP 149/77 | HR 73 | Temp 97.9°F | Ht 65.0 in | Wt 146.4 lb

## 2023-07-01 DIAGNOSIS — Z853 Personal history of malignant neoplasm of breast: Secondary | ICD-10-CM

## 2023-07-01 DIAGNOSIS — C50212 Malignant neoplasm of upper-inner quadrant of left female breast: Secondary | ICD-10-CM

## 2023-07-01 NOTE — Patient Instructions (Addendum)
Patient will be asked to return to the office in one year with a bilateral screening mammogram.  We will send you a letter about these appointments.   Continue self breast exams. Call office for any new breast issues or concerns.

## 2023-07-02 ENCOUNTER — Encounter: Payer: Self-pay | Admitting: Surgery

## 2023-07-02 NOTE — Progress Notes (Signed)
Outpatient Surgical Follow Up   Toni Callahan is an 82 y.o. female.   Chief Complaint  Patient presents with   Follow-up    1 yr mammo 06/24/23    HPI:  Toni Callahan is  an 82 year old female with history of left breast cancer status post lumpectomy and sentinel lymph node biopsy and  Chemo/radiation therapy by Dr. Lemar Livings in 2009.  Toni Callahan has been doing very well.  No fevers no chills.  No new breast concerns.  No masses no weight loss no lymphadenopathy.  Toni Callahan did have a recent routine mammogram that have personally reviewed showing no evidence of new concerning lesions Toni Callahan is otherwise doing very well and has no new medical issues. Toni Callahan does self examinations every month. Toni Callahan is otherwise doing well, Toni Callahan is very functional   Past Medical History:  Diagnosis Date   Allergy    Breast cancer (HCC) 2009   left /chemo/rad   Cancer (HCC) 2009   left breast, T1, N0, M0. ER/PR positive, HER2 neu positive   Dental bridge present    permanant - upper   GERD (gastroesophageal reflux disease)    Hyperlipidemia    Personal history of chemotherapy    Personal history of radiation therapy    Personal history of tobacco use, presenting hazards to health    SVT (supraventricular tachycardia) (HCC)    Tachycardia    Tubular adenoma of colon     Past Surgical History:  Procedure Laterality Date   BREAST BIOPSY Left 2009   positive   BREAST CYST ASPIRATION Bilateral 2005   neg   BREAST LUMPECTOMY Left 2009   positive   BREAST SURGERY Left 2009   lumpectomy   CATARACT EXTRACTION W/ INTRAOCULAR LENS IMPLANT Bilateral    COLONOSCOPY  [id number]   Dr. Bluford Kaufmann   COLONOSCOPY N/A 09/07/2015   Procedure: COLONOSCOPY;  Surgeon: Wallace Cullens, MD;  Location: [facility];  Service: Gastroenterology;  Laterality: N/A;   COLONOSCOPY WITH PROPOFOL N/A 06/15/2019   Procedure: COLONOSCOPY WITH PROPOFOL;  Surgeon: Toledo, Boykin Nearing, MD;  Location: [facility] END[facility];  Service: Endoscopy;  Laterality: N/A;    POLYPECTOMY  09/07/2015   Procedure: POLYPECTOMY;  Surgeon: Wallace Cullens, MD;  Location: [facility];  Service: Gastroenterology;;   [facility] REMOVAL  2011   PORTACATH PLACEMENT  2009   TONSILLECTOMY     TONSILLECTOMY      Family History  Problem Relation Age of Onset   Stroke Mother    Heart disease Father    Diabetes Brother    Heart disease Paternal Grandmother    Cancer Neg Hx    COPD Neg Hx    Hypertension Neg Hx    Breast cancer Neg Hx     Social History:  reports that Toni Callahan quit smoking about 38 years ago. Her smoking use included cigarettes. Toni Callahan started smoking about 58 years ago. Toni Callahan has never used smokeless tobacco. Toni Callahan reports current alcohol use. Toni Callahan reports that Toni Callahan does not use drugs.  Allergies:  Allergies  Allergen Reactions   Sulfa Antibiotics Nausea Only and Other (See Comments)    Upset stomach    Medications reviewed.    ROS Full ROS performed and is otherwise negative other than what is stated in HPI   BP (!) 149/77   Pulse 73   Temp 97.9 F (36.6 C) (Oral)   Ht 5\' 5"  (1.651 m)   Wt 146 lb 6.4 oz (66.4 kg)   SpO2 97%  BMI 24.36 kg/m   Physical Exam  Vitals and nursing note reviewed. Exam conducted with a chaperone present.  Constitutional:      General: Toni Callahan is not in acute distress.    Appearance: Normal appearance. Toni Callahan is normal weight.  Eyes:     General: No scleral icterus.       Right eye: No discharge.        Left eye: No discharge.  Cardiovascular:     Rate and Rhythm: Normal rate and regular rhythm.     Heart sounds: No murmur.  Pulmonary:     Effort: Pulmonary effort is normal. No respiratory distress.     Breath sounds: Normal breath sounds.     Comments: BREAST: Left lumpectomy scar and sentinel lymph node biopsy scar.  No evidence of new concerning lesions on either breast.  No evidence of lymphadenopathy. Abdominal:     General: Abdomen is flat. There is no distension.     Palpations: Abdomen is soft. There is no  mass.     Tenderness: There is no abdominal tenderness. There is no guarding or rebound.     Hernia: No hernia is present.  Musculoskeletal:     Cervical back: Normal range of motion and neck supple. No rigidity or tenderness.  Skin:    General: Skin is warm and dry.     Capillary Refill: Capillary refill takes less than 2 seconds.  Neurological:     General: No focal deficit present.     Mental Status: Toni Callahan is alert and oriented to person, place, and time.  Psychiatric:        Mood and Affect: Mood normal.        Behavior: Behavior normal.        Thought Content: Thought content normal.       Judgment: Judgment normal    Assessment/Plan: 82 yo very nice female with a history of left breast  cancer status postlumpectomy and radiation therapy now with no evidence of recurrence.  Normal mammogram and normal physical exam.  We will continue to follow her on a yearly basis per her request.  Please note that I have spent 30 minutes in this encounter including personally reviewing imaging studies, coordinating her care, counseling the patient and performing appropriate documentation  Sterling Big, MD [facility] General Surgeon

## 2023-09-20 DIAGNOSIS — I10 Essential (primary) hypertension: Secondary | ICD-10-CM | POA: Diagnosis not present

## 2023-09-20 DIAGNOSIS — E781 Pure hyperglyceridemia: Secondary | ICD-10-CM | POA: Diagnosis not present

## 2023-09-20 DIAGNOSIS — F419 Anxiety disorder, unspecified: Secondary | ICD-10-CM | POA: Diagnosis not present

## 2023-09-20 DIAGNOSIS — Z1321 Encounter for screening for nutritional disorder: Secondary | ICD-10-CM | POA: Diagnosis not present

## 2023-09-20 DIAGNOSIS — Z131 Encounter for screening for diabetes mellitus: Secondary | ICD-10-CM | POA: Diagnosis not present

## 2023-09-20 DIAGNOSIS — Z8659 Personal history of other mental and behavioral disorders: Secondary | ICD-10-CM | POA: Diagnosis not present

## 2023-09-20 DIAGNOSIS — Z0000 Encounter for general adult medical examination without abnormal findings: Secondary | ICD-10-CM | POA: Diagnosis not present

## 2023-09-30 DIAGNOSIS — I471 Supraventricular tachycardia, unspecified: Secondary | ICD-10-CM | POA: Diagnosis not present

## 2023-09-30 DIAGNOSIS — I10 Essential (primary) hypertension: Secondary | ICD-10-CM | POA: Diagnosis not present

## 2023-09-30 DIAGNOSIS — R0602 Shortness of breath: Secondary | ICD-10-CM | POA: Diagnosis not present

## 2023-09-30 DIAGNOSIS — I071 Rheumatic tricuspid insufficiency: Secondary | ICD-10-CM | POA: Diagnosis not present

## 2023-09-30 DIAGNOSIS — E782 Mixed hyperlipidemia: Secondary | ICD-10-CM | POA: Diagnosis not present

## 2023-11-06 ENCOUNTER — Other Ambulatory Visit: Payer: Self-pay | Admitting: Internal Medicine

## 2023-11-06 DIAGNOSIS — I10 Essential (primary) hypertension: Secondary | ICD-10-CM

## 2023-11-06 DIAGNOSIS — I471 Supraventricular tachycardia, unspecified: Secondary | ICD-10-CM

## 2023-11-07 NOTE — Telephone Encounter (Signed)
 Requested medications are due for refill today.  yes  Requested medications are on the active medications list.  yes  Last refill. 23/2023 #90 3 rf  Future visit scheduled.   no  Notes to clinic.  Pt has cancelled her upcoming appt. She may have changed providers.     Requested Prescriptions  Pending Prescriptions Disp Refills   metoprolol succinate (TOPROL-XL) 50 MG 24 hr tablet [Pharmacy Med Name: METOPROLOL SUCC ER 50 MG TAB] 90 tablet 0    Sig: Take 1 tablet (50 mg total) by mouth daily.     Cardiovascular:  Beta Blockers Failed - 11/07/2023  4:25 PM      Failed - Last BP in normal range    BP Readings from Last 1 Encounters:  07/01/23 (!) 149/77         Failed - Valid encounter within last 6 months    Recent Outpatient Visits   None            Passed - Last Heart Rate in normal range    Pulse Readings from Last 1 Encounters:  07/01/23 73

## 2023-11-11 ENCOUNTER — Other Ambulatory Visit: Payer: Self-pay | Admitting: Internal Medicine

## 2023-11-11 DIAGNOSIS — I10 Essential (primary) hypertension: Secondary | ICD-10-CM

## 2023-11-11 DIAGNOSIS — I471 Supraventricular tachycardia, unspecified: Secondary | ICD-10-CM

## 2023-11-12 NOTE — Telephone Encounter (Signed)
 Requested Prescriptions  Refused Prescriptions Disp Refills   metoprolol succinate (TOPROL-XL) 50 MG 24 hr tablet [Pharmacy Med Name: METOPROLOL SUCC ER 50 MG TAB] 90 tablet 0    Sig: Take 1 tablet (50 mg total) by mouth daily.     Cardiovascular:  Beta Blockers Failed - 11/12/2023  3:32 PM      Failed - Last BP in normal range    BP Readings from Last 1 Encounters:  07/01/23 (!) 149/77         Failed - Valid encounter within last 6 months    Recent Outpatient Visits   None            Passed - Last Heart Rate in normal range    Pulse Readings from Last 1 Encounters:  07/01/23 73

## 2023-12-20 ENCOUNTER — Other Ambulatory Visit: Payer: Self-pay | Admitting: Internal Medicine

## 2023-12-20 DIAGNOSIS — I471 Supraventricular tachycardia, unspecified: Secondary | ICD-10-CM

## 2023-12-20 DIAGNOSIS — I10 Essential (primary) hypertension: Secondary | ICD-10-CM

## 2023-12-23 NOTE — Telephone Encounter (Signed)
 Requested Prescriptions  Refused Prescriptions Disp Refills   metoprolol  succinate (TOPROL -XL) 50 MG 24 hr tablet [Pharmacy Med Name: METOPROLOL  SUCC ER 50 MG TAB] 90 tablet 0    Sig: Take 1 tablet (50 mg total) by mouth daily.     Cardiovascular:  Beta Blockers Failed - 12/23/2023  4:45 PM      Failed - Last BP in normal range    BP Readings from Last 1 Encounters:  07/01/23 (!) 149/77         Failed - Valid encounter within last 6 months    Recent Outpatient Visits   None            Passed - Last Heart Rate in normal range    Pulse Readings from Last 1 Encounters:  07/01/23 73

## 2024-01-03 ENCOUNTER — Inpatient Hospital Stay: Payer: Medicare PPO | Attending: Internal Medicine

## 2024-01-03 ENCOUNTER — Encounter: Payer: Self-pay | Admitting: Internal Medicine

## 2024-01-03 ENCOUNTER — Inpatient Hospital Stay: Payer: Medicare PPO | Admitting: Internal Medicine

## 2024-01-03 DIAGNOSIS — Z923 Personal history of irradiation: Secondary | ICD-10-CM | POA: Diagnosis not present

## 2024-01-03 DIAGNOSIS — Z853 Personal history of malignant neoplasm of breast: Secondary | ICD-10-CM

## 2024-01-03 DIAGNOSIS — Z9221 Personal history of antineoplastic chemotherapy: Secondary | ICD-10-CM | POA: Diagnosis not present

## 2024-01-03 DIAGNOSIS — Z87891 Personal history of nicotine dependence: Secondary | ICD-10-CM | POA: Insufficient documentation

## 2024-01-03 DIAGNOSIS — Z08 Encounter for follow-up examination after completed treatment for malignant neoplasm: Secondary | ICD-10-CM

## 2024-01-03 DIAGNOSIS — M810 Age-related osteoporosis without current pathological fracture: Secondary | ICD-10-CM | POA: Diagnosis not present

## 2024-01-03 LAB — CBC WITH DIFFERENTIAL (CANCER CENTER ONLY)
Abs Immature Granulocytes: 0.02 10*3/uL (ref 0.00–0.07)
Basophils Absolute: 0 10*3/uL (ref 0.0–0.1)
Basophils Relative: 0 %
Eosinophils Absolute: 0 10*3/uL (ref 0.0–0.5)
Eosinophils Relative: 0 %
HCT: 39 % (ref 36.0–46.0)
Hemoglobin: 12.7 g/dL (ref 12.0–15.0)
Immature Granulocytes: 0 %
Lymphocytes Relative: 37 %
Lymphs Abs: 2.5 10*3/uL (ref 0.7–4.0)
MCH: 30.2 pg (ref 26.0–34.0)
MCHC: 32.6 g/dL (ref 30.0–36.0)
MCV: 92.6 fL (ref 80.0–100.0)
Monocytes Absolute: 0.5 10*3/uL (ref 0.1–1.0)
Monocytes Relative: 7 %
Neutro Abs: 3.8 10*3/uL (ref 1.7–7.7)
Neutrophils Relative %: 56 %
Platelet Count: 221 10*3/uL (ref 150–400)
RBC: 4.21 MIL/uL (ref 3.87–5.11)
RDW: 12.4 % (ref 11.5–15.5)
WBC Count: 6.9 10*3/uL (ref 4.0–10.5)
nRBC: 0 % (ref 0.0–0.2)

## 2024-01-03 LAB — CMP (CANCER CENTER ONLY)
ALT: 16 U/L (ref 0–44)
AST: 20 U/L (ref 15–41)
Albumin: 3.6 g/dL (ref 3.5–5.0)
Alkaline Phosphatase: 52 U/L (ref 38–126)
Anion gap: 7 (ref 5–15)
BUN: 20 mg/dL (ref 8–23)
CO2: 24 mmol/L (ref 22–32)
Calcium: 8.4 mg/dL — ABNORMAL LOW (ref 8.9–10.3)
Chloride: 106 mmol/L (ref 98–111)
Creatinine: 0.97 mg/dL (ref 0.44–1.00)
GFR, Estimated: 58 mL/min — ABNORMAL LOW (ref >60)
Glucose, Bld: 124 mg/dL — ABNORMAL HIGH (ref 70–99)
Potassium: 3.7 mmol/L (ref 3.5–5.1)
Sodium: 137 mmol/L (ref 135–145)
Total Bilirubin: 0.6 mg/dL (ref 0.0–1.2)
Total Protein: 6.9 g/dL (ref 6.5–8.1)

## 2024-01-03 NOTE — Progress Notes (Signed)
 Patient here for oncology follow-up appointment, expresses no new concerns at this time.

## 2024-01-03 NOTE — Progress Notes (Signed)
 [facility] CONSULT NOTE  Patient Care Team: [place], Teodoro K, MD as PCP - General (Gastroenterology) Michelle Aid, MD as Consulting Physician (Cardiology) Cassie Click, MD as Consulting Physician (Gastroenterology) Gwyn Leos, MD as Consulting Physician (Oncology)  CHIEF COMPLAINTS/PURPOSE OF CONSULTATION: Breast cancer  Oncology History   No history exists.    HISTORY OF PRESENTING ILLNESS: Alone.  Ambulating independently.  Toni Callahan 82 y.o.  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  Patient here for oncology follow-up appointment, expresses no new concerns at this time   Patient denies any new lumps or bumps.  Appetite is  with no weight loss no nausea no vomiting.   Review of Systems  Constitutional:  Negative for chills, diaphoresis, fever, malaise/fatigue and weight loss.  HENT:  Negative for nosebleeds and sore throat.   Eyes:  Negative for double vision.  Respiratory:  Negative for cough, hemoptysis, sputum production, shortness of breath and wheezing.   Cardiovascular:  Negative for chest pain, palpitations, orthopnea and leg swelling.  Gastrointestinal:  Negative for abdominal pain, blood in stool, constipation, diarrhea, heartburn, melena, nausea and vomiting.  Genitourinary:  Negative for dysuria, frequency and urgency.  Musculoskeletal:  Negative for back pain and joint pain.  Skin: Negative.  Negative for itching and rash.  Neurological:  Negative for dizziness, tingling, focal weakness, weakness and headaches.  Endo/Heme/Allergies:  Does not bruise/bleed easily.  Psychiatric/Behavioral:  Negative for depression. The patient is not nervous/anxious and does not have insomnia.       MEDICAL HISTORY:  Past Medical History:  Diagnosis Date   Allergy    Breast cancer (HCC) 2009   left /chemo/rad   Cancer (HCC) 2009   left  breast, T1, N0, M0. ER/PR positive, HER2 neu positive   Dental bridge present    permanant - upper   GERD (gastroesophageal reflux disease)    Hyperlipidemia    Personal history of chemotherapy    Personal history of radiation therapy    Personal history of tobacco use, presenting hazards to health    SVT (supraventricular tachycardia) (HCC)    Tachycardia    Tubular adenoma of colon     SURGICAL HISTORY: Past Surgical History:  Procedure Laterality Date   BREAST BIOPSY Left 2009   positive   BREAST CYST ASPIRATION Bilateral 2005   neg   BREAST LUMPECTOMY Left 2009   positive   BREAST SURGERY Left 2009   lumpectomy   CATARACT EXTRACTION W/ INTRAOCULAR LENS IMPLANT Bilateral    COLONOSCOPY  [id number]   Dr. Janine Melbourne   COLONOSCOPY N/A 09/07/2015   Procedure: COLONOSCOPY;  Surgeon: Stephens Eis, MD;  Location: [facility];  Service: Gastroenterology;  Laterality: N/A;   COLONOSCOPY WITH PROPOFOL  N/A 06/15/2019   Procedure: COLONOSCOPY WITH PROPOFOL ;  Surgeon: Toledo, Alphonsus Jeans, MD;  Location: [facility];  Service: Endoscopy;  Laterality: N/A;   POLYPECTOMY  09/07/2015   Procedure: POLYPECTOMY;  Surgeon: Stephens Eis, MD;  Location: [facility];  Service: Gastroenterology;;   [facility] REMOVAL  2011   PORTACATH PLACEMENT  2009   TONSILLECTOMY     TONSILLECTOMY      SOCIAL HISTORY: Social History   Socioeconomic History   Marital status: Married    Spouse name: Not on file   Number of children: 0   Years of education: Not on file   Highest education level: Bachelor's degree (  e.g., BA, AB, BS)  Occupational History   Not on file  Tobacco Use   Smoking status: Former    Current packs/day: 0.00    Types: Cigarettes    Start date: 08/06/1964    Quit date: 08/06/1984    Years since quitting: 39.4   Smokeless tobacco: Never   Tobacco comments:    quit in 1986 approximately  Vaping Use   Vaping status: Never Used  Substance and Sexual Activity   Alcohol use: Yes     Comment: drinks rarely   Drug use: No   Sexual activity: Yes  Other Topics Concern   Not on file  Social History Narrative   Not on file   Social Drivers of Health   Financial Resource Strain: Low Risk  (09/20/2023)   Received from [facility]   Overall Financial Resource Strain (CARDIA)    Difficulty of Paying Living Expenses: Not hard at all  Food Insecurity: No Food Insecurity (09/20/2023)   Received from [facility]   Hunger Vital Sign    Worried About Running Out of Food in the Last Year: Never true    Ran Out of Food in the Last Year: Never true  Transportation Needs: No Transportation Needs (09/20/2023)   Received from [facility] - Transportation    In the past 12 months, has lack of transportation kept you from medical appointments or from getting medications?: No    Lack of Transportation (Non-Medical): No  Physical Activity: Sufficiently Active (12/14/2022)   Exercise Vital Sign    Days of Exercise per Week: 7 days    Minutes of Exercise per Session: 30 min  Stress: No Stress Concern Present (12/14/2022)   [employer] of Occupational Health - Occupational Stress Questionnaire    Feeling of Stress : Not at all  Social Connections: Moderately Integrated (12/14/2022)   Social Connection and Isolation Panel [NHANES]    Frequency of Communication with Friends and Family: More than three times a week    Frequency of Social Gatherings with Friends and Family: Three times a week    Attends Religious Services: More than 4 times per year    Active Member of Clubs or Organizations: No    Attends Banker Meetings: Never    Marital Status: Married  Catering manager Violence: Not At Risk (12/14/2022)   Humiliation, Afraid, Rape, and Kick questionnaire    Fear of Current or Ex-Partner: No    Emotionally Abused: No    Physically Abused: No    Sexually Abused: No    FAMILY HISTORY: Family History   Problem Relation Age of Onset   Stroke Mother    Heart disease Father    Diabetes Brother    Heart disease Paternal Grandmother    Cancer Neg Hx    COPD Neg Hx    Hypertension Neg Hx    Breast cancer Neg Hx     ALLERGIES:  is allergic to sulfa antibiotics.  MEDICATIONS:  Current Outpatient Medications  Medication Sig Dispense Refill   Apoaequorin (PREVAGEN PO) Take by mouth.     aspirin 81 MG tablet Take 81 mg by mouth daily.     Calcium  Carbonate (CALCIUM  600 PO) Take 1 tablet by mouth every other day.      Cholecalciferol (VITAMIN D PO) Take 1,000 Int'l Units by mouth daily.     Cyanocobalamin (VITAMIN B 12 PO) Take 500 mg by mouth daily.  Multiple Vitamin (MULTIVITAMIN) tablet Take 1 tablet by mouth daily.     Omega-3 Fatty Acids (FISH OIL) 1000 MG CAPS Take 1 capsule by mouth every other day.      Potassium Gluconate 595 MG CAPS Take by mouth daily.     vitamin C (ASCORBIC ACID) 500 MG tablet Take 500 mg by mouth every other day.      zinc gluconate 50 MG tablet Take 50 mg by mouth daily.     atorvastatin  (LIPITOR) 10 MG tablet One by mouth at bedtime three nights a week 36 tablet 3   Biotin 5000 MCG CAPS Take 1 capsule by mouth daily. (Patient not taking: Reported on 01/03/2024)     folic acid (FOLVITE) 400 MCG tablet Take 400 mcg by mouth daily. (Patient not taking: Reported on 01/03/2024)     Magnesium 250 MG TABS Take 1 tablet by mouth daily. (Patient not taking: Reported on 01/03/2024)     metoprolol  succinate (TOPROL -XL) 50 MG 24 hr tablet Take 1 tablet (50 mg total) by mouth daily. (Patient not taking: Reported on 01/03/2024) 90 tablet 3   sertraline  (ZOLOFT ) 50 MG tablet Take 1 tablet (50 mg total) by mouth daily. (Patient not taking: Reported on 01/03/2024) 90 tablet 1   No current facility-administered medications for this visit.      PHYSICAL EXAMINATION:   Vitals:   01/03/24 1419  BP: 126/69  Pulse: 79  Resp: 17  Temp: 98.7 F (37.1 C)  SpO2: 98%    Filed Weights   01/03/24 1419  Weight: 150 lb (68 kg)    Physical Exam Vitals and nursing note reviewed.  Constitutional:      Comments:     HENT:     Head: Normocephalic and atraumatic.     Mouth/Throat:     Mouth: Mucous membranes are moist.     Pharynx: Oropharynx is clear. No oropharyngeal exudate.  Eyes:     Extraocular Movements: Extraocular movements intact.     Pupils: Pupils are equal, round, and reactive to light.  Cardiovascular:     Rate and Rhythm: Normal rate and regular rhythm.  Pulmonary:     Effort: No respiratory distress.     Breath sounds: No wheezing.     Comments: Decreased breath sounds bilaterally.  Abdominal:     General: Bowel sounds are normal. There is no distension.     Palpations: Abdomen is soft. There is no mass.     Tenderness: There is no abdominal tenderness. There is no guarding or rebound.  Musculoskeletal:        General: No tenderness. Normal range of motion.     Cervical back: Normal range of motion and neck supple.  Skin:    General: Skin is warm.  Neurological:     General: No focal deficit present.     Mental Status: She is alert and oriented to person, place, and time.  Psychiatric:        Mood and Affect: Affect normal.        Behavior: Behavior normal.        Judgment: Judgment normal.     LABORATORY DATA:  I have reviewed the data as listed Lab Results  Component Value Date   WBC 6.9 01/03/2024   HGB 12.7 01/03/2024   HCT 39.0 01/03/2024   MCV 92.6 01/03/2024   PLT 221 01/03/2024   Recent Labs    01/04/23 1409 01/03/24 1359  NA 137 137  K 4.1 3.7  CL  103 106  CO2 25 24  GLUCOSE 88 124*  BUN 22 20  CREATININE 1.21* 0.97  CALCIUM  8.8* 8.4*  GFRNONAA 44* 58*  PROT 7.2 6.9  ALBUMIN 4.0 3.6  AST 19 20  ALT 18 16  ALKPHOS 48 52  BILITOT 0.4 0.6    No results found.  History of breast cancer # Stage I ER positive Her2/neu+ left breast cancer - s/p lumpectomy, chemotherapy, and 10 years adjuvant  femara  completed in 2020. Mammo- Bil Nov 2024- WNL.  Stable.    # Osteoporosis- OFF fosamax  per pcp. 2022- The BMD- T-score: -2. calcium  and vitamin d. Stable [PCP].   #Since patient is clinically stable I think is reasonable for the patient to follow-up with PCP/can follow-up with us  as needed.  Patient comfortable with the plan; to call us  if any questions or concerns in the interim.will need  Mammo- Bil Nov 2025/annual mammogram-    # DISPOSITION:  # Follow up as needed- Dr.B   All questions were answered. The patient knows to call the clinic with any problems, questions or concerns.    Gwyn Leos, MD 01/03/2024 3:22 PM

## 2024-01-03 NOTE — Assessment & Plan Note (Addendum)
#   Stage I ER positive Her2/neu+ left breast cancer - s/p lumpectomy, chemotherapy, and 10 years adjuvant femara  completed in 2020. Mammo- Bil Nov 2024- WNL.  Stable.    # Osteoporosis- OFF fosamax  per pcp. 2022- The BMD- T-score: -2. calcium  and vitamin d. Stable [PCP].   #Since patient is clinically stable I think is reasonable for the patient to follow-up with PCP/can follow-up with us  as needed.  Patient comfortable with the plan; to call us  if any questions or concerns in the interim.will need  Mammo- Bil Nov 2025/annual mammogram-    # DISPOSITION:  # Follow up as needed- Dr.B

## 2024-04-27 ENCOUNTER — Other Ambulatory Visit: Payer: Self-pay

## 2024-04-27 DIAGNOSIS — Z1231 Encounter for screening mammogram for malignant neoplasm of breast: Secondary | ICD-10-CM

## 2024-06-24 ENCOUNTER — Ambulatory Visit
Admission: RE | Admit: 2024-06-24 | Discharge: 2024-06-24 | Disposition: A | Discharge status: Home / Self Care | Source: Ambulatory Visit | Attending: Surgery | Admitting: Surgery

## 2024-06-24 ENCOUNTER — Encounter

## 2024-06-24 DIAGNOSIS — Z1231 Encounter for screening mammogram for malignant neoplasm of breast: Secondary | ICD-10-CM | POA: Diagnosis present

## 2024-07-06 ENCOUNTER — Encounter: Payer: Self-pay | Admitting: Surgery

## 2024-07-06 ENCOUNTER — Ambulatory Visit: Admitting: Surgery

## 2024-07-06 VITALS — BP 152/75 | HR 58 | Ht 65.0 in | Wt 151.0 lb

## 2024-07-06 DIAGNOSIS — Z853 Personal history of malignant neoplasm of breast: Secondary | ICD-10-CM

## 2024-07-06 DIAGNOSIS — Z08 Encounter for follow-up examination after completed treatment for malignant neoplasm: Secondary | ICD-10-CM

## 2024-07-06 NOTE — Progress Notes (Signed)
 Outpatient Surgical Follow Up  07/06/2024  Toni Callahan is an 83 y.o. female.   Chief Complaint  Patient presents with   Follow-up    HPI:  Toni Callahan is a delightful 83 year old female with history of left breast cancer status post lumpectomy and sentinel lymph node biopsy and  Chemo/radiation therapy by Dr. Dessa in 2009.  She has been doing very well.  No fevers no chills.  No new breast concerns.  No masses no weight loss no lymphadenopathy.  She did have a recent routine mammogram that have personally reviewed showing no evidence of new concerning lesions SHe is otherwise doing very well and has no new medical issues. SHe does self examinations every month. She is otherwise doing well, she is very functional   Past Medical History:  Diagnosis Date   Allergy    Breast cancer (HCC) 2009   left /chemo/rad   Cancer (HCC) 2009   left breast, T1, N0, M0. ER/PR positive, HER2 neu positive   Dental bridge present    permanant - upper   GERD (gastroesophageal reflux disease)    Hyperlipidemia    Personal history of chemotherapy    Personal history of radiation therapy    Personal history of tobacco use, presenting hazards to health    SVT (supraventricular tachycardia)    Tachycardia    Tubular adenoma of colon     Past Surgical History:  Procedure Laterality Date   BREAST BIOPSY Left 2009   positive   BREAST CYST ASPIRATION Bilateral 2005   neg   BREAST LUMPECTOMY Left 2009   positive   BREAST SURGERY Left 2009   lumpectomy   CATARACT EXTRACTION W/ INTRAOCULAR LENS IMPLANT Bilateral    COLONOSCOPY  [id number]   Dr. Ora   COLONOSCOPY N/A 09/07/2015   Procedure: COLONOSCOPY;  Surgeon: Deward CINDERELLA Ora, MD;  Location: [facility];  Service: Gastroenterology;  Laterality: N/A;   COLONOSCOPY WITH PROPOFOL  N/A 06/15/2019   Procedure: COLONOSCOPY WITH PROPOFOL ;  Surgeon: Toledo, Ladell POUR, MD;  Location: [facility];  Service: Endoscopy;  Laterality: N/A;    POLYPECTOMY  09/07/2015   Procedure: POLYPECTOMY;  Surgeon: Deward CINDERELLA Ora, MD;  Location: [facility];  Service: Gastroenterology;;   [facility] REMOVAL  2011   PORTACATH PLACEMENT  2009   TONSILLECTOMY     TONSILLECTOMY      Family History  Problem Relation Age of Onset   Stroke Mother    Heart disease Father    Diabetes Brother    Heart disease Paternal Grandmother    Cancer Neg Hx    COPD Neg Hx    Hypertension Neg Hx    Breast cancer Neg Hx     Social History:  reports that she quit smoking about 39 years ago. Her smoking use included cigarettes. She started smoking about 59 years ago. She has been exposed to tobacco smoke. She has never used smokeless tobacco. She reports current alcohol use. She reports that she does not use drugs.  Allergies:  Allergies  Allergen Reactions   Sulfa Antibiotics Nausea Only and Other (See Comments)    Upset stomach    Medications reviewed.    ROS Full ROS performed and is otherwise negative other than what is stated in HPI   BP (!) 152/75   Pulse (!) 58   Ht 5' 5 (1.651 m)   Wt 151 lb (68.5 kg)   SpO2 98%   BMI 25.13 kg/m   Physical Exam  Vitals  and nursing note reviewed. Exam conducted with a chaperone present.  Constitutional:      General: She is not in acute distress.    Appearance: Normal appearance. She is normal weight.  Eyes:     General: No scleral icterus.       Right eye: No discharge.        Left eye: No discharge.  Cardiovascular:     Rate and Rhythm: Normal rate and regular rhythm.     Heart sounds: No murmur.  Pulmonary:     Effort: Pulmonary effort is normal. No respiratory distress.     Breath sounds: Normal breath sounds.     Comments: BREAST: Left lumpectomy scar and sentinel lymph node biopsy scar.  No evidence of new concerning lesions on either breast.  No evidence of lymphadenopathy. Abdominal:     General: Abdomen is flat. There is no distension.     Palpations: Abdomen is soft. There is  no mass.     Tenderness: There is no abdominal tenderness. There is no guarding or rebound.     Hernia: No hernia is present.  Musculoskeletal:     Cervical back: Normal range of motion and neck supple. No rigidity or tenderness.  Skin:    General: Skin is warm and dry.     Capillary Refill: Capillary refill takes less than 2 seconds.  Neurological:     General: No focal deficit present.     Mental Status: She is alert and oriented to person, place, and time.  Psychiatric:        Mood and Affect: Mood normal.        Behavior: Behavior normal.        Thought Content: Thought content normal.       Judgment: Judgment normal      Assessment/Plan: 83 yo very nice female with a history of left breast  cancer status postlumpectomy and radiation therapy now with no evidence of recurrence.  Normal mammogram and normal physical exam.  We will continue to follow her on a yearly basis per her request I personally spent a total of 30 minutes in the care of the patient today including performing a medically appropriate exam/evaluation, counseling and educating, placing orders, referring and communicating with other health care professionals, documenting clinical information in the EHR, independently interpreting and reviewing images studies and coordinating care.   Toni Luna, MD [facility] General Surgeon

## 2024-07-06 NOTE — Patient Instructions (Addendum)
 Patient will be asked to return to the office in one year with a bilateral screening mammogram. We will send you a letter about these appointments.    Continue self breast exams. Call office for any new breast issues or concerns.    How to Do a Breast Self-Exam Doing breast self-exams can help you stay healthy. They're one way to know what's normal for your breasts. They can help you catch a problem while it's still small and can be treated. You need to: Check your breasts often. Tell your doctor about any changes. You should do breast self-exams even if you have breast implants. What you need: A mirror. A well-lit room. A pillow or other soft object. How to do a breast self-exam Look for changes  Take off all the clothes above your waist. Stand in front of a mirror in a room with good lighting. Put your hands down at your sides. Compare your breasts in the mirror. Look for difference between them, such as: Differences in shape. Differences in size. Wrinkles, dips, and bumps in one breast and not the other. Look at each breast for skin changes, such as: Redness. Scaly spots. Spots where your skin is thicker. Dimpling. Open sores. Look for changes in your nipples, such as: Fluid coming out of a nipple. Fluid around a nipple. Bleeding. Dimpling. Redness. A nipple that looks pushed in or that has changed position. Feel for changes Lie on your back. Feel each breast. To do this: Pick a breast to feel. Place a pillow under the shoulder closest to that breast. Put the arm closest to that breast behind your head. Feel the breast using the hand of your other arm. Use the pads of your three middle fingers to make small circles starting near the nipple. Use light, medium, and firm pressure. Keep making circles, moving down over the breast. Stop when you feel your ribs. Start making circles with your fingers again, this time going up until you reach your collarbone. Then, make  circles out across your breast and into your armpit area. Squeeze your nipple. Check for fluid and lumps. Do these steps again to check your other breast. Sit or stand in the tub or shower. With soapy water on your skin, feel each breast the same way you did when you were lying down. Write down what you find Writing down what you find can help you keep track of what you want to tell your doctor. Write down: What's normal for each breast. Any changes you find. Write down: The kind of change. If your breast feels tender or painful. Any lump you find. Write down its size and where it is. When you last had your period. General tips If you're breastfeeding, the best time to check your breasts is after you feed your baby or after you use a breast pump. If you get a period, the best time to check your breasts is 5-7 days after your period ends. With time, you'll get more used to doing the self-exam. You'll also start to know if there are changes in your breasts. Contact a doctor if: You see a change in the shape or size of your breasts or nipples. You see a change in the skin of your breast or nipples. You have fluid coming from your nipples that isn't normal. You find a new lump or thick area. You have breast pain. You have any concerns about your breast health. This information is not intended to replace advice given to  you by your health care provider. Make sure you discuss any questions you have with your health care provider. Document Revised: 10/02/2023 Document Reviewed: 10/02/2023 Elsevier Patient Education  2025 [employer].
